# Patient Record
Sex: Female | Born: 1995 | Race: White | Hispanic: No | Marital: Married | Smoking: Former smoker
Health system: Southern US, Community
[De-identification: ages and names within clinical notes are randomized; demographics above are authoritative.]

## PROBLEM LIST (undated history)

## (undated) DIAGNOSIS — F32A Depression, unspecified: Secondary | ICD-10-CM

## (undated) DIAGNOSIS — Z789 Other specified health status: Secondary | ICD-10-CM

## (undated) DIAGNOSIS — I10 Essential (primary) hypertension: Secondary | ICD-10-CM

## (undated) DIAGNOSIS — R87629 Unspecified abnormal cytological findings in specimens from vagina: Secondary | ICD-10-CM

## (undated) DIAGNOSIS — T1490XA Injury, unspecified, initial encounter: Secondary | ICD-10-CM

## (undated) DIAGNOSIS — E119 Type 2 diabetes mellitus without complications: Secondary | ICD-10-CM

## (undated) DIAGNOSIS — E282 Polycystic ovarian syndrome: Secondary | ICD-10-CM

## (undated) HISTORY — DX: Essential (primary) hypertension: I10

## (undated) HISTORY — PX: OTHER SURGICAL HISTORY: SHX169

## (undated) HISTORY — DX: Injury, unspecified, initial encounter: T14.90XA

## (undated) HISTORY — DX: Depression, unspecified: F32.A

## (undated) HISTORY — DX: Type 2 diabetes mellitus without complications: E11.9

## (undated) HISTORY — DX: Unspecified abnormal cytological findings in specimens from vagina: R87.629

## (undated) HISTORY — PX: COLON SURGERY: SHX602

---
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
## 2014-07-22 DIAGNOSIS — O99210 Obesity complicating pregnancy, unspecified trimester: Secondary | ICD-10-CM | POA: Insufficient documentation

## 2014-07-27 DIAGNOSIS — Z23 Encounter for immunization: Secondary | ICD-10-CM | POA: Insufficient documentation

## 2014-09-29 NOTE — Op Note (Signed)
PATIENT NAME:  Latoya Benson, Kinzie L MR#:  [id number] DATE OF BIRTH:  1995-10-09  DATE OF PROCEDURE:  05/23/2012  PREOPERATIVE DIAGNOSIS: Symptomatic cholelithiasis.   POSTOPERATIVE DIAGNOSIS: Symptomatic cholelithiasis.    PROCEDURE PERFORMED: Laparoscopic cholecystectomy without cholangiogram.   SURGEON: Malahki Gasaway A. Jouri Threat, MD   ESTIMATED BLOOD LOSS: 15 mL.   SPECIMENS: Gallbladder with a large gallstone.   COMPLICATIONS: None.   INDICATION FOR SURGERY: Ms. Quintella ReichertHooper is a pleasant 19 year old female with history of right upper quadrant pain. She was found to have gallstones and biliary-type symptoms and, therefore, was brought to the operating room for laparoscopic cholecystectomy.   DETAILS OF PROCEDURE: Ms. Quintella ReichertHooper was brought to the operating room suite. Her consent was signed by her guardian prior. She was laid supine on the operating room table. She was induced, endotracheal tube was placed, and general anesthesia was administered. The abdomen was prepped and draped in standard surgical fashion. An infraumbilical incision was made. It was deepened down to the fascia. The fascia was grasped. Fascia was incised. A finger was placed through the fascial defect. 2-0 Vicryl stay sutures were placed through the fascia. Hassan trocar was placed in the abdomen and the abdomen was insufflated. A camera was placed in the abdomen. There were no adhesions, fortunately. The gallbladder was visualized. An 11 mm trocar was placed into the epigastric region. Two 5 mm ports were placed 2 cm below the costal margin in the midclavicular and right anterior axillary lines. The gallbladder was grasped and lifted over the dome of the liver with a trocar placed through the lateral most port. The infundibulum was grasped and retracted laterally. The cystic duct and cystic artery were dissected out. The critical view was obtained. I then placed clips across the cystic duct and cystic artery and ligated them. The  gallbladder was then taken off the gallbladder fossa and taken out through the umbilical port with an EndoCatch bag. I then irrigated the abdomen and looked for hemostasis. Hemostasis was obtained. I then removed all trocars under visualization. I then tied the previously placed stay sutures to close the umbilical defect. I then injected the skin with 1% lidocaine with epinephrine, approximately 20 mL, and closed the skin with interrupted 3-0 Vicryl deep dermal sutures. Dermabond was then placed over the wound. The patient was then awoken, extubated, and brought to the postanesthesia care unit. There were no immediate complications. Needle, sponge, and instrument counts were correct at the end of the procedure.   ____________________________ Si Raiderhristopher A. Cadee Agro, MD cal:drc D: 05/23/2012 15:08:36 ET T: 05/24/2012 06:14:46 ET JOB#: [id number]  cc: Cristal Deerhristopher A. Eugena Rhue, MD, <Dictator> Jarvis NewcomerHRISTOPHER A Nehemiah Mcfarren MD ELECTRONICALLY SIGNED 05/24/2012 10:40

## 2016-04-02 ENCOUNTER — Emergency Department: Payer: Self-pay

## 2016-04-02 ENCOUNTER — Inpatient Hospital Stay
Admission: EM | Admit: 2016-04-02 | Discharge: 2016-04-05 | DRG: 872 | Disposition: A | Discharge status: Home / Self Care | Payer: Self-pay | Attending: Specialist | Admitting: Specialist

## 2016-04-02 ENCOUNTER — Encounter: Payer: Self-pay | Admitting: Emergency Medicine

## 2016-04-02 DIAGNOSIS — R000 Tachycardia, unspecified: Secondary | ICD-10-CM | POA: Diagnosis present

## 2016-04-02 DIAGNOSIS — R6520 Severe sepsis without septic shock: Secondary | ICD-10-CM

## 2016-04-02 DIAGNOSIS — A419 Sepsis, unspecified organism: Principal | ICD-10-CM | POA: Diagnosis present

## 2016-04-02 DIAGNOSIS — N10 Acute tubulo-interstitial nephritis: Secondary | ICD-10-CM | POA: Diagnosis present

## 2016-04-02 DIAGNOSIS — Z87440 Personal history of urinary (tract) infections: Secondary | ICD-10-CM

## 2016-04-02 DIAGNOSIS — R51 Headache: Secondary | ICD-10-CM | POA: Diagnosis present

## 2016-04-02 DIAGNOSIS — E876 Hypokalemia: Secondary | ICD-10-CM | POA: Diagnosis present

## 2016-04-02 DIAGNOSIS — N390 Urinary tract infection, site not specified: Secondary | ICD-10-CM | POA: Diagnosis present

## 2016-04-02 DIAGNOSIS — Z9049 Acquired absence of other specified parts of digestive tract: Secondary | ICD-10-CM

## 2016-04-02 DIAGNOSIS — Z87891 Personal history of nicotine dependence: Secondary | ICD-10-CM

## 2016-04-02 DIAGNOSIS — R300 Dysuria: Secondary | ICD-10-CM | POA: Diagnosis present

## 2016-04-02 DIAGNOSIS — B9620 Unspecified Escherichia coli [E. coli] as the cause of diseases classified elsewhere: Secondary | ICD-10-CM | POA: Diagnosis present

## 2016-04-02 DIAGNOSIS — N1330 Unspecified hydronephrosis: Secondary | ICD-10-CM | POA: Diagnosis present

## 2016-04-02 HISTORY — DX: Other specified health status: Z78.9

## 2016-04-02 LAB — BLOOD CULTURE ID PANEL (REFLEXED)
ACINETOBACTER BAUMANNII: NOT DETECTED
CANDIDA ALBICANS: NOT DETECTED
CANDIDA GLABRATA: NOT DETECTED
CANDIDA KRUSEI: NOT DETECTED
Candida parapsilosis: NOT DETECTED
Candida tropicalis: NOT DETECTED
Carbapenem resistance: NOT DETECTED
ENTEROBACTERIACEAE SPECIES: DETECTED — AB
ENTEROCOCCUS SPECIES: NOT DETECTED
ESCHERICHIA COLI: DETECTED — AB
Enterobacter cloacae complex: NOT DETECTED
Haemophilus influenzae: NOT DETECTED
KLEBSIELLA OXYTOCA: NOT DETECTED
Klebsiella pneumoniae: NOT DETECTED
LISTERIA MONOCYTOGENES: NOT DETECTED
Neisseria meningitidis: NOT DETECTED
PSEUDOMONAS AERUGINOSA: NOT DETECTED
Proteus species: NOT DETECTED
STREPTOCOCCUS PNEUMONIAE: NOT DETECTED
STREPTOCOCCUS PYOGENES: NOT DETECTED
Serratia marcescens: NOT DETECTED
Staphylococcus aureus (BCID): NOT DETECTED
Staphylococcus species: NOT DETECTED
Streptococcus agalactiae: NOT DETECTED
Streptococcus species: NOT DETECTED

## 2016-04-02 LAB — COMPREHENSIVE METABOLIC PANEL
ALT: 19 U/L (ref 14–54)
ANION GAP: 11 (ref 5–15)
AST: 29 U/L (ref 15–41)
Albumin: 4 g/dL (ref 3.5–5.0)
Alkaline Phosphatase: 51 U/L (ref 38–126)
BUN: 8 mg/dL (ref 6–20)
CHLORIDE: 103 mmol/L (ref 101–111)
CO2: 22 mmol/L (ref 22–32)
Calcium: 9.1 mg/dL (ref 8.9–10.3)
Creatinine, Ser: 1.08 mg/dL — ABNORMAL HIGH (ref 0.44–1.00)
GFR calc non Af Amer: >60 mL/min (ref >60)
Glucose, Bld: 166 mg/dL — ABNORMAL HIGH (ref 65–99)
Potassium: 3.1 mmol/L — ABNORMAL LOW (ref 3.5–5.1)
SODIUM: 136 mmol/L (ref 135–145)
Total Bilirubin: 1 mg/dL (ref 0.3–1.2)
Total Protein: 7.8 g/dL (ref 6.5–8.1)

## 2016-04-02 LAB — PREGNANCY, URINE: Preg Test, Ur: NEGATIVE

## 2016-04-02 LAB — URINALYSIS COMPLETE WITH MICROSCOPIC (ARMC ONLY)
Bilirubin Urine: NEGATIVE
Glucose, UA: NEGATIVE mg/dL
KETONES UR: NEGATIVE mg/dL
NITRITE: NEGATIVE
PROTEIN: 30 mg/dL — AB
Specific Gravity, Urine: 1.006 (ref 1.005–1.030)
pH: 6 (ref 5.0–8.0)

## 2016-04-02 LAB — CBC WITH DIFFERENTIAL/PLATELET
BASOS ABS: 0.1 10*3/uL (ref 0–0.1)
Basophils Relative: 1 %
Eosinophils Absolute: 0 10*3/uL (ref 0–0.7)
Eosinophils Relative: 0 %
HEMATOCRIT: 39.8 % (ref 35.0–47.0)
Hemoglobin: 13.2 g/dL (ref 12.0–16.0)
LYMPHS PCT: 4 %
Lymphs Abs: 0.6 10*3/uL — ABNORMAL LOW (ref 1.0–3.6)
MCH: 30.3 pg (ref 26.0–34.0)
MCHC: 33.3 g/dL (ref 32.0–36.0)
MCV: 91.1 fL (ref 80.0–100.0)
Monocytes Absolute: 0.5 10*3/uL (ref 0.2–0.9)
Monocytes Relative: 4 %
NEUTROS ABS: 12 10*3/uL — AB (ref 1.4–6.5)
NEUTROS PCT: 91 %
PLATELETS: 266 10*3/uL (ref 150–440)
RBC: 4.37 MIL/uL (ref 3.80–5.20)
RDW: 13.5 % (ref 11.5–14.5)
WBC: 13.2 10*3/uL — AB (ref 3.6–11.0)

## 2016-04-02 LAB — LACTIC ACID, PLASMA
LACTIC ACID, VENOUS: 1.6 mmol/L (ref 0.5–1.9)
Lactic Acid, Venous: 3.7 mmol/L (ref 0.5–1.9)

## 2016-04-02 MED ORDER — ONDANSETRON HCL 4 MG/2ML IJ SOLN
INTRAMUSCULAR | Status: AC
  Administered 2016-04-02: 4 mg via INTRAVENOUS
  Filled 2016-04-02: qty 2

## 2016-04-02 MED ORDER — SODIUM CHLORIDE 0.9 % IV BOLUS (SEPSIS)
1000.0000 mL | Freq: Once | INTRAVENOUS | Status: AC
  Administered 2016-04-02: 1000 mL via INTRAVENOUS

## 2016-04-02 MED ORDER — ONDANSETRON HCL 4 MG/2ML IJ SOLN
4.0000 mg | Freq: Four times a day (QID) | INTRAMUSCULAR | Status: DC | PRN
  Administered 2016-04-02: 4 mg via INTRAVENOUS
  Filled 2016-04-02: qty 2

## 2016-04-02 MED ORDER — IBUPROFEN 400 MG PO TABS
800.0000 mg | ORAL_TABLET | Freq: Once | ORAL | Status: AC
  Administered 2016-04-02: 800 mg via ORAL
  Filled 2016-04-02: qty 2

## 2016-04-02 MED ORDER — INFLUENZA VAC SPLIT QUAD 0.5 ML IM SUSY
0.5000 mL | PREFILLED_SYRINGE | INTRAMUSCULAR | Status: AC
  Administered 2016-04-05: 0.5 mL via INTRAMUSCULAR
  Filled 2016-04-02: qty 0.5

## 2016-04-02 MED ORDER — ONDANSETRON HCL 4 MG PO TABS
4.0000 mg | ORAL_TABLET | Freq: Four times a day (QID) | ORAL | Status: DC | PRN
  Administered 2016-04-02 – 2016-04-04 (×4): 4 mg via ORAL
  Filled 2016-04-02 (×4): qty 1

## 2016-04-02 MED ORDER — POTASSIUM CHLORIDE CRYS ER 20 MEQ PO TBCR
40.0000 meq | EXTENDED_RELEASE_TABLET | Freq: Once | ORAL | Status: AC
  Administered 2016-04-02: 40 meq via ORAL
  Filled 2016-04-02: qty 2

## 2016-04-02 MED ORDER — MORPHINE SULFATE (PF) 2 MG/ML IV SOLN: 2.0000 mg | INTRAVENOUS | Status: DC | PRN

## 2016-04-02 MED ORDER — ACETAMINOPHEN 325 MG PO TABS
650.0000 mg | ORAL_TABLET | Freq: Once | ORAL | Status: AC | PRN
  Administered 2016-04-02: 650 mg via ORAL

## 2016-04-02 MED ORDER — ALUM & MAG HYDROXIDE-SIMETH 200-200-20 MG/5ML PO SUSP
30.0000 mL | ORAL | Status: DC | PRN
  Administered 2016-04-04: 30 mL via ORAL
  Filled 2016-04-02: qty 30

## 2016-04-02 MED ORDER — DEXTROSE 5 % IV SOLN: 2.0000 g | Freq: Once | INTRAVENOUS | Status: DC

## 2016-04-02 MED ORDER — CEFTRIAXONE SODIUM-DEXTROSE 2-2.22 GM-% IV SOLR
2.0000 g | INTRAVENOUS | Status: DC
  Filled 2016-04-02: qty 50

## 2016-04-02 MED ORDER — MORPHINE SULFATE (PF) 2 MG/ML IV SOLN
2.0000 mg | Freq: Once | INTRAVENOUS | Status: AC
  Administered 2016-04-02: 2 mg via INTRAVENOUS
  Filled 2016-04-02: qty 1

## 2016-04-02 MED ORDER — MORPHINE SULFATE (PF) 2 MG/ML IV SOLN
INTRAVENOUS | Status: AC
  Administered 2016-04-02: 2 mg via INTRAVENOUS
  Filled 2016-04-02: qty 1

## 2016-04-02 MED ORDER — ONDANSETRON HCL 4 MG/2ML IJ SOLN
4.0000 mg | Freq: Once | INTRAMUSCULAR | Status: AC
  Administered 2016-04-02: 4 mg via INTRAVENOUS

## 2016-04-02 MED ORDER — ACETAMINOPHEN 650 MG RE SUPP: 650.0000 mg | Freq: Four times a day (QID) | RECTAL | Status: DC | PRN

## 2016-04-02 MED ORDER — OXYCODONE HCL 5 MG PO TABS
5.0000 mg | ORAL_TABLET | ORAL | Status: DC | PRN
  Administered 2016-04-02 – 2016-04-04 (×3): 5 mg via ORAL
  Filled 2016-04-02 (×3): qty 1

## 2016-04-02 MED ORDER — SODIUM CHLORIDE 0.9 % IV SOLN
INTRAVENOUS | Status: DC
  Administered 2016-04-02 – 2016-04-04 (×5): via INTRAVENOUS

## 2016-04-02 MED ORDER — MORPHINE SULFATE (PF) 2 MG/ML IV SOLN
2.0000 mg | Freq: Once | INTRAVENOUS | Status: AC
  Administered 2016-04-02: 2 mg via INTRAVENOUS

## 2016-04-02 MED ORDER — ENOXAPARIN SODIUM 40 MG/0.4ML ~~LOC~~ SOLN
SUBCUTANEOUS | Status: AC
  Administered 2016-04-02: 40 mg via SUBCUTANEOUS
  Filled 2016-04-02: qty 0.4

## 2016-04-02 MED ORDER — ACETAMINOPHEN 325 MG PO TABS
ORAL_TABLET | ORAL | Status: AC
  Administered 2016-04-02: 650 mg via ORAL
  Filled 2016-04-02: qty 2

## 2016-04-02 MED ORDER — CEFTRIAXONE SODIUM-DEXTROSE 2-2.22 GM-% IV SOLR
2.0000 g | Freq: Once | INTRAVENOUS | Status: AC
  Administered 2016-04-02: 2 g via INTRAVENOUS
  Filled 2016-04-02: qty 50

## 2016-04-02 MED ORDER — ACETAMINOPHEN 325 MG PO TABS
650.0000 mg | ORAL_TABLET | Freq: Four times a day (QID) | ORAL | Status: DC | PRN
  Administered 2016-04-02 – 2016-04-03 (×2): 650 mg via ORAL
  Filled 2016-04-02 (×3): qty 2

## 2016-04-02 MED ORDER — ENOXAPARIN SODIUM 40 MG/0.4ML ~~LOC~~ SOLN
40.0000 mg | SUBCUTANEOUS | Status: DC
  Administered 2016-04-03 – 2016-04-04 (×2): 40 mg via SUBCUTANEOUS
  Filled 2016-04-02 (×2): qty 0.4

## 2016-04-02 NOTE — ED Provider Notes (Signed)
[facility]  I accepted care from Dr. Zenda AlpersWebster ____________________________________________    LABS (pertinent positives/negatives)  Labs Reviewed  COMPREHENSIVE METABOLIC PANEL - Abnormal; Notable for the following:       Result Value   Potassium 3.1 (*)    Glucose, Bld 166 (*)    Creatinine, Ser 1.08 (*)    All other components within normal limits  LACTIC ACID, PLASMA - Abnormal; Notable for the following:    Lactic Acid, Venous 3.7 (*)    All other components within normal limits  CBC WITH DIFFERENTIAL/PLATELET - Abnormal; Notable for the following:    WBC 13.2 (*)    Neutro Abs 12.0 (*)    Lymphs Abs 0.6 (*)    All other components within normal limits  URINALYSIS COMPLETEWITH MICROSCOPIC ([facility] ONLY) - Abnormal; Notable for the following:    Color, Urine YELLOW (*)    APPearance CLOUDY (*)    Hgb urine dipstick 2+ (*)    Protein, ur 30 (*)    Leukocytes, UA 3+ (*)    Bacteria, UA MANY (*)    Squamous Epithelial / LPF 0-5 (*)    All other components within normal limits  CULTURE, BLOOD (ROUTINE X 2)  CULTURE, BLOOD (ROUTINE X 2)  URINE CULTURE  LACTIC ACID, PLASMA  PREGNANCY, URINE     ____________________________________________    RADIOLOGY All xrays were viewed by me. Imaging interpreted by radiologist.  CT abdomen and pelvis stone study:  IMPRESSION: Right perinephric and periureteral stranding edema with mild right pelviectasis and diffuse right hydroureter. No associated radiopaque obstructing urinary tract calculus. Appearance is compatible with pyelonephritis versus recently passed stone.  Remote cholecystectomy.  Unilateral right L5 pars defect.  ____________________________________________   PROCEDURES  Procedure(s) performed: None  Critical Care performed: None  ____________________________________________   INITIAL IMPRESSION / ASSESSMENT AND PLAN / ED COURSE   Pertinent labs & imaging results that were  available during my care of the patient were reviewed by me and considered in my medical decision making (see chart for details).  Patient is being treated for sepsis with elevated lactate, urinary tract infection, and elevated white blood cell count with antibiotics and fluid resuscitation. No hypotension.  I did give her pain control with morphine.  CT scan result I discussed with the radiologist, no kidney stone although there is a plebolith visible.  CT scan does not show a kidney stone, symptoms consistent with right-sided pyelonephritis.  CONSULTATIONS: Hospitalist for admission.    Patient / Family / Caregiver informed of clinical course, medical decision-making process, and agree with plan.     ____________________________________________   FINAL CLINICAL IMPRESSION(S) / ED DIAGNOSES  Final diagnoses:  Severe sepsis (HCC)  Acute pyelonephritis        Governor Rooksebecca Danil Wedge, MD 04/02/16 [phone]

## 2016-04-02 NOTE — Progress Notes (Signed)
Md paged , pt c/o heartburn.

## 2016-04-02 NOTE — ED Notes (Signed)
MD at bedside. 

## 2016-04-02 NOTE — ED Notes (Signed)
Patient transported to X-ray 

## 2016-04-02 NOTE — H&P (Signed)
[facility] [place] [facility]   PATIENT NAME: Latoya Benson    MR#:  [id number]  DATE OF BIRTH:  1995-11-16   DATE OF ADMISSION:  04/02/2016  PRIMARY CARE PHYSICIAN: No PCP Per Patient   REQUESTING/REFERRING PHYSICIAN: Lord  CHIEF COMPLAINT:   Chief Complaint  Patient presents with  . Urinary Frequency  . Back Pain  . Code Sepsis    HISTORY OF PRESENT ILLNESS:  Latoya Benson  is a 20 y.o. female with a known history of Cholecystectomy otherwise no pertinent medical history who is presenting with 3 day duration of back pain. She describes right-sided flank pain 3 day duration described as sharp in quality progressively worsening intensity this morning 10/10 associated right gutters subjective fever and chills, urinary frequency and dysuria.  On arrival to emergency department meeting septic criteria code sepsis initiated  PAST MEDICAL HISTORY:   Past Medical History:  Diagnosis Date  . Medical history non-contributory     PAST SURGICAL HISTORY:   Past Surgical History:  Procedure Laterality Date  . CHOLECYSTECTOMY    . COLON SURGERY      SOCIAL HISTORY:   Social History  Substance Use Topics  . Smoking status: Former Games developer  . Smokeless tobacco: Never Used  . Alcohol use Not on file    FAMILY HISTORY:   Family History  Problem Relation Age of Onset  . Diabetes Neg Hx     DRUG ALLERGIES:  No Known Allergies  REVIEW OF SYSTEMS:  REVIEW OF SYSTEMS:  CONSTITUTIONAL: Positive fevers, chills, fatigue, weakness.  EYES: Denies blurred vision, double vision, or eye pain.  EARS, NOSE, THROAT: Denies tinnitus, ear pain, hearing loss.  RESPIRATORY: denies cough, shortness of breath, wheezing  CARDIOVASCULAR: Denies chest pain, palpitations, edema.  GASTROINTESTINAL: Positive nausea, vomiting, denies diarrhea, abdominal pain.  GENITOURINARY: Positive dysuria, denies hematuria.  ENDOCRINE: Denies nocturia or thyroid  problems. HEMATOLOGIC AND LYMPHATIC: Denies easy bruising or bleeding.  SKIN: Denies rash or lesions.  MUSCULOSKELETAL: Denies pain in neck, back, shoulder, knees, hips, or further arthritic symptoms.  NEUROLOGIC: Denies paralysis, paresthesias.  PSYCHIATRIC: Denies anxiety or depressive symptoms. Otherwise full review of systems performed by me is negative.   MEDICATIONS AT HOME:   Prior to Admission medications   Medication Sig Start Date End Date Taking? Authorizing Provider  acetaminophen (TYLENOL) 500 MG tablet Take 2,000 mg by mouth every 6 (six) hours as needed.   Yes Historical Provider, MD      VITAL SIGNS:  Blood pressure 106/74, pulse (!) 125, temperature 100 F (37.8 C), temperature source Oral, resp. rate 16, height 5\' 2"  (1.575 m), weight 83 kg (183 lb), last menstrual period 03/12/2016, SpO2 98 %.  PHYSICAL EXAMINATION:  VITAL SIGNS: Vitals:   04/02/16 0941 04/02/16 1036  BP: 128/78 106/74  Pulse: 88 (!) 125  Resp: 18 16  Temp:     GENERAL:20 y.o.female currently in no acute distress.  HEAD: Normocephalic, atraumatic.  EYES: Pupils equal, round, reactive to light. Extraocular muscles intact. No scleral icterus.  MOUTH: Moist mucosal membrane. Dentition intact. No abscess noted.  EAR, NOSE, THROAT: Clear without exudates. No external lesions.  NECK: Supple. No thyromegaly. No nodules. No JVD.  PULMONARY: Clear to ascultation, without wheeze rails or rhonci. No use of accessory muscles, Good respiratory effort. good air entry bilaterally CHEST: Nontender to palpation.  CARDIOVASCULAR: S1 and S2.Tachycardic No murmurs, rubs, or gallops. No edema. Pedal pulses 2+ bilaterally.  GASTROINTESTINAL: Positive right-sided CVA tenderness Soft,  nontender, nondistended. No masses. Positive bowel sounds. No hepatosplenomegaly.  MUSCULOSKELETAL: No swelling, clubbing, or edema. Range of motion full in all extremities.  NEUROLOGIC: Cranial nerves II through XII are intact. No  gross focal neurological deficits. Sensation intact. Reflexes intact.  SKIN: No ulceration, lesions, rashes, or cyanosis. Skin warm and dry. Turgor intact.  PSYCHIATRIC: Mood, affect within normal limits. The patient is awake, alert and oriented x 3. Insight, judgment intact.    LABORATORY PANEL:   CBC  Recent Labs Lab 04/02/16 0603  WBC 13.2*  HGB 13.2  HCT 39.8  PLT 266   ------------------------------------------------------------------------------------------------------------------  Chemistries   Recent Labs Lab 04/02/16 0603  NA 136  K 3.1*  CL 103  CO2 22  GLUCOSE 166*  BUN 8  CREATININE 1.08*  CALCIUM 9.1  AST 29  ALT 19  ALKPHOS 51  BILITOT 1.0   ------------------------------------------------------------------------------------------------------------------  Cardiac Enzymes No results for input(s): TROPONINI in the last 168 hours. ------------------------------------------------------------------------------------------------------------------  RADIOLOGY:  Dg Chest 2 View  Result Date: 04/02/2016 CLINICAL DATA:  20 year old female with fever EXAM: CHEST  2 VIEW COMPARISON:  Chest radiograph dated 07/27/2011 FINDINGS: The heart size and mediastinal contours are within normal limits. Both lungs are clear. The visualized skeletal structures are unremarkable. IMPRESSION: No active cardiopulmonary disease. Electronically Signed   By: Elgie CollardArash  Radparvar M.D.   On: 04/02/2016 06:46   Ct Renal Stone Study  Result Date: 04/02/2016 CLINICAL DATA:  Acute right flank pain, fever, sepsis EXAM: CT ABDOMEN AND PELVIS WITHOUT CONTRAST TECHNIQUE: Multidetector CT imaging of the abdomen and pelvis was performed following the standard protocol without IV contrast. COMPARISON:  05/16/2012 ultrasound FINDINGS: Lower chest: No acute abnormality. Hepatobiliary: No focal liver abnormality is seen. Status post cholecystectomy. No biliary dilatation. Pancreas: Unremarkable. No  pancreatic ductal dilatation or surrounding inflammatory changes. Spleen: Normal in size without focal abnormality. Accessory splenule noted. Adrenals/Urinary Tract: Normal adrenal glands. Right kidney demonstrates perinephric strandy edema and mild pelviectasis. There is associated diffuse mild right hydroureter with periureteral stranding edema but no radiopaque obstructing urinary tract calculus. Appearance is compatible with ascending urinary tract infection (pyelonephritis) versus recently passed stone. Left kidney and ureter demonstrate no acute process or nephrolithiasis. Stomach/Bowel: Stomach is within normal limits. Appendix appears normal. No evidence of bowel wall thickening, distention, or inflammatory changes. Vascular/Lymphatic: No significant vascular findings are present. No enlarged abdominal or pelvic lymph nodes. Reproductive: Uterus and bilateral adnexa are unremarkable. Other: No abdominal wall hernia or abnormality. No abdominopelvic ascites. Musculoskeletal: No acute osseous finding. L5 demonstrates a unilateral right pars defects without associated anterior slippage. IMPRESSION: Right perinephric and periureteral stranding edema with mild right pelviectasis and diffuse right hydroureter. No associated radiopaque obstructing urinary tract calculus. Appearance is compatible with pyelonephritis versus recently passed stone. Remote cholecystectomy. Unilateral right L5 pars defect. Electronically Signed   By: Judie PetitM.  Shick M.D.   On: 04/02/2016 09:05    EKG:  No orders found for this or any previous visit.  IMPRESSION AND PLAN:   20 year old Caucasian female without significant medical history presenting with flank pain  1.Sepsis, meeting septic criteria by temperature, heart rate, leukocytosis present on arrival. Source pyelonephritis Code sepsis initiated. Panculture. Broad-spectrum antibiotics including ceftriaxone and taper antibiotics when culture data returns.  Has received a 30 mL/kg  IV fluid bolus. Continue IV fluid hydration to keep mean arterial pressure greater than 65. may require pressor therapy if blood pressure worsens. We will repeat lactic acid if the initial is greater than 2.2.  2. Hypokalemia: Replace goal 4-5     All the records are reviewed and case discussed with ED provider. Management plans discussed with the patient, family and they are in agreement.  CODE STATUS: Full  TOTAL TIME TAKING CARE OF THIS PATIENT: 33 minutes.    Jenia Klepper,  Mardi Mainland.D on 04/02/2016 at 10:59 AM  Between 7am to 6pm - Pager - [phone]  After 6pm: House Pager: - [phone]  [facility]  Office  [phone]  CC: Primary care physician; No PCP Per Patient

## 2016-04-02 NOTE — ED Notes (Signed)
Back from ct scan    C/o headache and flank pain   Dr Shaune PollackLord aware  IV meds given for pain

## 2016-04-02 NOTE — Progress Notes (Signed)
Pharmacy Antibiotic Note  Latoya Benson is a 20 y.o. female admitted on 04/02/2016 with UTI.  Pharmacy has been consulted for ceftriaxone dosing.  Plan: Ceftriaxone 2 grams q 24 hours ordered.  Height: 5\' 2"  (157.5 cm) Weight: 183 lb (83 kg) IBW/kg (Calculated) : 50.1  Temp (24hrs), Avg:102.5 F (39.2 C), Min:102.5 F (39.2 C), Max:102.5 F (39.2 C)   Recent Labs Lab 04/02/16 0603  WBC 13.2*  CREATININE 1.08*  LATICACIDVEN 3.7*    Estimated Creatinine Clearance: 82.3 mL/min (by C-G formula based on SCr of 1.08 mg/dL (H)).    No Known Allergies  Antimicrobials this admission: ceftriaxone  >>    >>   Dose adjustments this admission:   Microbiology results: 10/22 BCx: pending 10/22 UCx: pending     10/22 UA: pending   Thank you for allowing pharmacy to be a part of this patient's care.  Cristyn Crossno S 04/02/2016 6:57 AM

## 2016-04-02 NOTE — ED Triage Notes (Signed)
Patient with right lower back pain times three days. Patient states that she is having lower abdominal pain and urinary frequency.

## 2016-04-02 NOTE — Progress Notes (Signed)
Dr. Clint GuyHower notified of temp 101.8. MD to enter orders.

## 2016-04-02 NOTE — ED Notes (Signed)
To ct scan via w/c  Feeling better

## 2016-04-02 NOTE — ED Provider Notes (Signed)
[facility] Emergency Department Provider Note   ____________________________________________   First MD Initiated Contact with Patient 04/02/16 [phone]     (approximate)  I have reviewed the triage vital signs and the nursing notes.   HISTORY  Chief Complaint Urinary Frequency; Back Pain; and Code Sepsis    HPI Latoya Benson is a 20 y.o. female comes into the hospital today with low back pain. The patient reports that this started 2 days ago. She thought she had eaten some greasy food and because she doesn't have a gallbladder she thought that might have been the cause of her pain. The patient took some Tylenol but reports that the pain continued and was worse while she was at work last night. The patient has had some chills with some headache. The patient reports that by the time she left work last night she was dripping sweat. At 4:30 this morning she started vomiting so she decided to come in to the hospital for evaluation. The patient denies burning with urination but reports that she's been having to go frequently. She also reports that after she uses the bathroom she feels as though he has to go again. The patient denies fever at home. The pain was initially on the right side of her back and now it is bilateral. The patient denies any cough or runny nose. She denies any chest pain. She is here for evaluation.The patient rates her pain a 10 out of 10 in intensity.   History reviewed. No pertinent past medical history.  There are no active problems to display for this patient.   Past Surgical History:  Procedure Laterality Date  . CHOLECYSTECTOMY    . COLON SURGERY      Prior to Admission medications   Medication Sig Start Date End Date Taking? Authorizing Provider  acetaminophen (TYLENOL) 500 MG tablet Take 2,000 mg by mouth every 6 (six) hours as needed.   Yes Historical Provider, MD    Allergies Review of patient's allergies indicates no  known allergies.  No family history on file.  Social History Social History  Substance Use Topics  . Smoking status: Former Games developer  . Smokeless tobacco: Never Used  . Alcohol use Not on file    Review of Systems Constitutional:  fever/chills Eyes: No visual changes. ENT: No sore throat. Cardiovascular: Denies chest pain. Respiratory: Denies shortness of breath. Gastrointestinal:  abdominal pain, nausea, vomiting.  No diarrhea.  No constipation. Genitourinary: Frequency and urgency Musculoskeletal:  back pain. Skin: Negative for rash. Neurological: Negative for headaches, focal weakness or numbness.  10-point ROS otherwise negative.  ____________________________________________   PHYSICAL EXAM:  VITAL SIGNS: ED Triage Vitals [04/02/16 0545]  Enc Vitals Group     BP (!) 107/92     Pulse Rate (!) 151     Resp 18     Temp (!) 102.5 F (39.2 C)     Temp Source Oral     SpO2 97 %     Weight 183 lb (83 kg)     Height 5\' 2"  (1.575 m)     Head Circumference      Peak Flow      Pain Score 10     Pain Loc      Pain Edu?      Excl. in GC?     Constitutional: Alert and oriented. Well appearing and in mild distress. Eyes: Conjunctivae are normal. PERRL. EOMI. Head: Atraumatic. Nose: No congestion/rhinnorhea. Mouth/Throat: Mucous membranes are moist.  Oropharynx non-erythematous. Cardiovascular: Normal rate, regular rhythm. Grossly normal heart sounds.  Good peripheral circulation. Respiratory: Normal respiratory effort.  No retractions. Lungs CTAB. Gastrointestinal: Soft With some mild lower abdominal tenderness to palpation. No distention. Positive bowel sounds Musculoskeletal: No lower extremity tenderness nor edema.  Neurologic:  Normal speech and language.  Skin:  Skin is warm, dry and intact.  Psychiatric: Mood and affect are normal.   ____________________________________________   LABS (all labs ordered are listed, but only abnormal results are  displayed)  Labs Reviewed  COMPREHENSIVE METABOLIC PANEL - Abnormal; Notable for the following:       Result Value   Potassium 3.1 (*)    Glucose, Bld 166 (*)    Creatinine, Ser 1.08 (*)    All other components within normal limits  LACTIC ACID, PLASMA - Abnormal; Notable for the following:    Lactic Acid, Venous 3.7 (*)    All other components within normal limits  CBC WITH DIFFERENTIAL/PLATELET - Abnormal; Notable for the following:    WBC 13.2 (*)    Neutro Abs 12.0 (*)    Lymphs Abs 0.6 (*)    All other components within normal limits  URINALYSIS COMPLETEWITH MICROSCOPIC ([facility] ONLY) - Abnormal; Notable for the following:    Color, Urine YELLOW (*)    APPearance CLOUDY (*)    Hgb urine dipstick 2+ (*)    Protein, ur 30 (*)    Leukocytes, UA 3+ (*)    Bacteria, UA MANY (*)    Squamous Epithelial / LPF 0-5 (*)    All other components within normal limits  CULTURE, BLOOD (ROUTINE X 2)  CULTURE, BLOOD (ROUTINE X 2)  URINE CULTURE  PREGNANCY, URINE  LACTIC ACID, PLASMA   ____________________________________________  EKG  ED ECG REPORT I, Rebecka Apley, the attending physician, personally viewed and interpreted this ECG.   Date: 04/02/2016  EKG Time: 559  Rate: 137  Rhythm: sinus tachycardia  Axis: normal  Intervals:none  ST&T Change: none  ____________________________________________  RADIOLOGY  CT renal ____________________________________________   PROCEDURES  Procedure(s) performed: None  Procedures  Critical Care performed: Yes, see critical care note(s)   CRITICAL CARE Performed by: Lucrezia Europe P   Total critical care time: 40 minutes  Critical care time was exclusive of separately billable procedures and treating other patients.  Critical care was necessary to treat or prevent imminent or life-threatening deterioration.  Critical care was time spent personally by me on the following activities: development of treatment plan  with patient and/or surrogate as well as nursing, discussions with consultants, evaluation of patient's response to treatment, examination of patient, obtaining history from patient or surrogate, ordering and performing treatments and interventions, ordering and review of laboratory studies, ordering and review of radiographic studies, pulse oximetry and re-evaluation of patient's condition.   ____________________________________________   INITIAL IMPRESSION / ASSESSMENT AND PLAN / ED COURSE  Pertinent labs & imaging results that were available during my care of the patient were reviewed by me and considered in my medical decision making (see chart for details).  This is a 20 year old female who comes into the hospital today with some back pain and fever and tachycardia. The patient was vomiting which is what brought her into the hospital. A code sepsis was called when the patient came in because of her tachycardia as well as her fever. We did give the patient liter of normal saline initially and awaited the results of her blood work. The patient's lactic acid  did return at 3.7 so we did increase the patient's fluid to reflect 30 mls/kg. He also appears that the patient has a urinary tract infection. Given the patient's back pain I did send her for a CT scan to ensure that she does not have a kidney stone. Patient also received a dose of ceftriaxone for her infection.  Clinical Course  Value Comment By Time  DG Chest 2 View No active cardiopulmonary disease. Rebecka ApleyAllison P Webster, MD 10/22 [phone]   ED Sepsis - Repeat Assessment   Performed at:    04/02/16  Last Vitals:    Blood pressure 130/75, pulse 100, temperature 100 F (37.8 C), temperature source Oral, resp. rate 20, height 5\' 2"  (1.575 m), weight 183 lb (83 kg), last menstrual period 03/12/2016, SpO2 98 %.  Heart:                 Tachycardia, no m/r/g  Lungs:     CTAB  Capillary Refill:   < 2sec  Peripheral Pulse (include location): 2+  dorsalis pedis   Skin (include color):   Warm, dry, normal color   ____________________________________________   FINAL CLINICAL IMPRESSION(S) / ED DIAGNOSES  Final diagnoses:  Severe sepsis (HCC)  Acute pyelonephritis  Kidney stone      NEW MEDICATIONS STARTED DURING THIS VISIT:  New Prescriptions   No medications on file     Note:  This document was prepared using Dragon voice recognition software and may include unintentional dictation errors.    Rebecka ApleyAllison P Webster, MD 04/02/16 [phone]

## 2016-04-03 DIAGNOSIS — N10 Acute tubulo-interstitial nephritis: Secondary | ICD-10-CM

## 2016-04-03 DIAGNOSIS — R6520 Severe sepsis without septic shock: Secondary | ICD-10-CM

## 2016-04-03 LAB — BASIC METABOLIC PANEL
Anion gap: 7 (ref 5–15)
BUN: 6 mg/dL (ref 6–20)
CALCIUM: 7.9 mg/dL — AB (ref 8.9–10.3)
CHLORIDE: 104 mmol/L (ref 101–111)
CO2: 23 mmol/L (ref 22–32)
CREATININE: 0.81 mg/dL (ref 0.44–1.00)
GFR calc Af Amer: >60 mL/min (ref >60)
GFR calc non Af Amer: >60 mL/min (ref >60)
GLUCOSE: 100 mg/dL — AB (ref 65–99)
Potassium: 3.3 mmol/L — ABNORMAL LOW (ref 3.5–5.1)
Sodium: 134 mmol/L — ABNORMAL LOW (ref 135–145)

## 2016-04-03 LAB — CBC
HCT: 34.7 % — ABNORMAL LOW (ref 35.0–47.0)
Hemoglobin: 11.8 g/dL — ABNORMAL LOW (ref 12.0–16.0)
MCH: 30.9 pg (ref 26.0–34.0)
MCHC: 33.9 g/dL (ref 32.0–36.0)
MCV: 91.2 fL (ref 80.0–100.0)
PLATELETS: 197 10*3/uL (ref 150–440)
RBC: 3.81 MIL/uL (ref 3.80–5.20)
RDW: 13.9 % (ref 11.5–14.5)
WBC: 8.7 10*3/uL (ref 3.6–11.0)

## 2016-04-03 MED ORDER — SODIUM CHLORIDE 0.9 % IV SOLN
1.0000 g | Freq: Three times a day (TID) | INTRAVENOUS | Status: DC
  Administered 2016-04-03 – 2016-04-04 (×5): 1 g via INTRAVENOUS
  Filled 2016-04-03 (×8): qty 1

## 2016-04-03 MED ORDER — POTASSIUM CHLORIDE CRYS ER 20 MEQ PO TBCR
40.0000 meq | EXTENDED_RELEASE_TABLET | Freq: Once | ORAL | Status: AC
  Administered 2016-04-03: 40 meq via ORAL
  Filled 2016-04-03: qty 2

## 2016-04-03 MED ORDER — BUTALBITAL-APAP-CAFFEINE 50-325-40 MG PO TABS
1.0000 | ORAL_TABLET | ORAL | Status: DC | PRN
  Administered 2016-04-03 – 2016-04-04 (×5): 1 via ORAL
  Filled 2016-04-03 (×6): qty 1

## 2016-04-03 NOTE — Progress Notes (Signed)
[facility] - [place] at [facility]                                                                                                                                                                                            Patient Demographics   Latoya Benson, is a 20 y.o. female, DOB - 1995/11/11, ZOX:[id number]  Admit date - 04/02/2016   Admitting Physician Wyatt Haste, MD  Outpatient Primary MD for the patient is No PCP Per Patient   LOS - 1  Subjective: Patient continues to complain of having flank pain and a headache. He denies any chest pain or palpitations denies any nausea or vomiting    Review of Systems:   CONSTITUTIONAL: No documented fever. No fatigue, weakness. No weight gain, no weight loss.  EYES: No blurry or double vision.  ENT: No tinnitus. No postnasal drip. No redness of the oropharynx.  RESPIRATORY: No cough, no wheeze, no hemoptysis. No dyspnea.  CARDIOVASCULAR: No chest pain. No orthopnea. No palpitations. No syncope.  GASTROINTESTINAL: No nausea, no vomiting or diarrhea. No abdominal pain. No melena or hematochezia.  GENITOURINARY: Positive dysuria Positive hematuria. Positive flank pain ENDOCRINE: No polyuria or nocturia. No heat or cold intolerance.  HEMATOLOGY: No anemia. No bruising. No bleeding.  INTEGUMENTARY: No rashes. No lesions.  MUSCULOSKELETAL: No arthritis. No swelling. No gout.  NEUROLOGIC: No numbness, tingling, or ataxia. No seizure-type activity.  PSYCHIATRIC: No anxiety. No insomnia. No ADD.    Vitals:   Vitals:   04/02/16 2142 04/03/16 0450 04/03/16 0953 04/03/16 1214  BP: 112/73 124/66  (!) 122/58  Pulse: (!) 106 (!) 117  (!) 111  Resp: (!) 24 (!) 22  18  Temp: 98.9 F (37.2 C) (!) 100.5 F (38.1 C) (!) 100.5 F (38.1 C) 99.9 F (37.7 C)  TempSrc: Oral Oral Oral Oral  SpO2: 100% 97%  100%  Weight:      Height:        Wt Readings from Last 3 Encounters:  04/02/16 183 lb (83 kg)      Intake/Output Summary (Last 24 hours) at 04/03/16 1338 Last data filed at 04/03/16 0458  Gross per 24 hour  Intake             2294 ml  Output                0 ml  Net             2294 ml    Physical Exam:   GENERAL: Pleasant-appearing in no apparent distress.  HEAD, EYES, EARS, NOSE AND THROAT: Atraumatic, normocephalic. Extraocular muscles  are intact. Pupils equal and reactive to light. Sclerae anicteric. No conjunctival injection. No oro-pharyngeal erythema.  NECK: Supple. There is no jugular venous distention. No bruits, no lymphadenopathy, no thyromegaly.  HEART: Regular rate and rhythm,. No murmurs, no rubs, no clicks.  LUNGS: Clear to auscultation bilaterally. No rales or rhonchi. No wheezes.  ABDOMEN: Soft, flat, nontender, nondistended. Has good bowel sounds. No hepatosplenomegaly appreciated. Positive flank tenderness EXTREMITIES: No evidence of any cyanosis, clubbing, or peripheral edema.  +2 pedal and radial pulses bilaterally.  NEUROLOGIC: The patient is alert, awake, and oriented x3 with no focal motor or sensory deficits appreciated bilaterally.  SKIN: Moist and warm with no rashes appreciated.  Psych: Not anxious, depressed LN: No inguinal LN enlargement    Antibiotics   Anti-infectives    Start     Dose/Rate Route Frequency Ordered Stop   04/03/16 1000  cefTRIAXone (ROCEPHIN) IVPB 2 g  Status:  Discontinued     2 g 100 mL/hr over 30 Minutes Intravenous Every 24 hours 04/02/16 0656 04/03/16 0043   04/03/16 0200  meropenem (MERREM) 1 g in sodium chloride 0.9 % 100 mL IVPB     1 g 200 mL/hr over 30 Minutes Intravenous Every 8 hours 04/03/16 0043     04/02/16 0700  cefTRIAXone (ROCEPHIN) 2 g in dextrose 5 % 50 mL IVPB  Status:  Discontinued     2 g 100 mL/hr over 30 Minutes Intravenous  Once 04/02/16 0648 04/02/16 0651   04/02/16 0700  cefTRIAXone (ROCEPHIN) IVPB 2 g     2 g 100 mL/hr over 30 Minutes Intravenous  Once 04/02/16 0651 04/02/16 0737       Medications   Scheduled Meds: . enoxaparin (LOVENOX) injection  40 mg Subcutaneous Q24H  . Influenza vac split quadrivalent PF  0.5 mL Intramuscular Tomorrow-1000  . meropenem (MERREM) IV  1 g Intravenous Q8H   Continuous Infusions: . sodium chloride 100 mL/hr at 04/03/16 0955   PRN Meds:.acetaminophen **OR** acetaminophen, alum & mag hydroxide-simeth, butalbital-acetaminophen-caffeine, morphine injection, ondansetron **OR** ondansetron (ZOFRAN) IV, oxyCODONE   Data Review:   Micro Results Recent Results (from the past 240 hour(s))  Culture, blood (Routine x 2)     Status: None (Preliminary result)   Collection Time: 04/02/16  6:03 AM  Result Value Ref Range Status   Specimen Description BLOOD LEFT FOREARM  Final   Special Requests BOTTLES DRAWN AEROBIC AND ANAEROBIC 6CCAERO,5CCANA  Final   Culture  Setup Time   Final    Organism ID to follow GRAM NEGATIVE RODS AEROBIC BOTTLE ONLY CRITICAL RESULT CALLED TO, READ BACK BY AND VERIFIED WITH: MATT MCBANE AT 2320 ON 04/02/16 RWW CONFIRMED BY RWW    Culture NO GROWTH 1 DAY  Final   Report Status PENDING  Incomplete  Blood Culture ID Panel (Reflexed)     Status: Abnormal   Collection Time: 04/02/16  6:03 AM  Result Value Ref Range Status   Enterococcus species NOT DETECTED NOT DETECTED Final   Listeria monocytogenes NOT DETECTED NOT DETECTED Final   Staphylococcus species NOT DETECTED NOT DETECTED Final   Staphylococcus aureus NOT DETECTED NOT DETECTED Final   Streptococcus species NOT DETECTED NOT DETECTED Final   Streptococcus agalactiae NOT DETECTED NOT DETECTED Final   Streptococcus pneumoniae NOT DETECTED NOT DETECTED Final   Streptococcus pyogenes NOT DETECTED NOT DETECTED Final   Acinetobacter baumannii NOT DETECTED NOT DETECTED Final   Enterobacteriaceae species DETECTED (A) NOT DETECTED Final    Comment: CRITICAL RESULT CALLED  TO, READ BACK BY AND VERIFIED WITH: MATT MCBANE AT 2320 ON 04/02/16 RWW     Enterobacter cloacae complex NOT DETECTED NOT DETECTED Final   Escherichia coli DETECTED (A) NOT DETECTED Final    Comment: CRITICAL RESULT CALLED TO, READ BACK BY AND VERIFIED WITH: MATT MCBANE AT 2320 ON 04/02/16 RWW    Klebsiella oxytoca NOT DETECTED NOT DETECTED Final   Klebsiella pneumoniae NOT DETECTED NOT DETECTED Final   Proteus species NOT DETECTED NOT DETECTED Final   Serratia marcescens NOT DETECTED NOT DETECTED Final   Carbapenem resistance NOT DETECTED NOT DETECTED Final   Haemophilus influenzae NOT DETECTED NOT DETECTED Final   Neisseria meningitidis NOT DETECTED NOT DETECTED Final   Pseudomonas aeruginosa NOT DETECTED NOT DETECTED Final   Candida albicans NOT DETECTED NOT DETECTED Final   Candida glabrata NOT DETECTED NOT DETECTED Final   Candida krusei NOT DETECTED NOT DETECTED Final   Candida parapsilosis NOT DETECTED NOT DETECTED Final   Candida tropicalis NOT DETECTED NOT DETECTED Final  Culture, blood (Routine x 2)     Status: None (Preliminary result)   Collection Time: 04/02/16  6:17 AM  Result Value Ref Range Status   Specimen Description BLOOD LEFT FOREARM  Final   Special Requests BOTTLES DRAWN AEROBIC AND ANAEROBIC  12CC  Final   Culture NO GROWTH < 24 HOURS  Final   Report Status PENDING  Incomplete  Urine culture     Status: Abnormal (Preliminary result)   Collection Time: 04/02/16  7:14 AM  Result Value Ref Range Status   Specimen Description URINE, RANDOM  Final   Special Requests NONE  Final   Culture >=100,000 COLONIES/mL ESCHERICHIA COLI (A)  Final   Report Status PENDING  Incomplete    Radiology Reports Dg Chest 2 View  Result Date: 04/02/2016 CLINICAL DATA:  20 year old female with fever EXAM: CHEST  2 VIEW COMPARISON:  Chest radiograph dated 07/27/2011 FINDINGS: The heart size and mediastinal contours are within normal limits. Both lungs are clear. The visualized skeletal structures are unremarkable. IMPRESSION: No active cardiopulmonary  disease. Electronically Signed   By: Elgie Collard M.D.   On: 04/02/2016 06:46   Ct Renal Stone Study  Result Date: 04/02/2016 CLINICAL DATA:  Acute right flank pain, fever, sepsis EXAM: CT ABDOMEN AND PELVIS WITHOUT CONTRAST TECHNIQUE: Multidetector CT imaging of the abdomen and pelvis was performed following the standard protocol without IV contrast. COMPARISON:  05/16/2012 ultrasound FINDINGS: Lower chest: No acute abnormality. Hepatobiliary: No focal liver abnormality is seen. Status post cholecystectomy. No biliary dilatation. Pancreas: Unremarkable. No pancreatic ductal dilatation or surrounding inflammatory changes. Spleen: Normal in size without focal abnormality. Accessory splenule noted. Adrenals/Urinary Tract: Normal adrenal glands. Right kidney demonstrates perinephric strandy edema and mild pelviectasis. There is associated diffuse mild right hydroureter with periureteral stranding edema but no radiopaque obstructing urinary tract calculus. Appearance is compatible with ascending urinary tract infection (pyelonephritis) versus recently passed stone. Left kidney and ureter demonstrate no acute process or nephrolithiasis. Stomach/Bowel: Stomach is within normal limits. Appendix appears normal. No evidence of bowel wall thickening, distention, or inflammatory changes. Vascular/Lymphatic: No significant vascular findings are present. No enlarged abdominal or pelvic lymph nodes. Reproductive: Uterus and bilateral adnexa are unremarkable. Other: No abdominal wall hernia or abnormality. No abdominopelvic ascites. Musculoskeletal: No acute osseous finding. L5 demonstrates a unilateral right pars defects without associated anterior slippage. IMPRESSION: Right perinephric and periureteral stranding edema with mild right pelviectasis and diffuse right hydroureter. No associated radiopaque obstructing urinary tract calculus.  Appearance is compatible with pyelonephritis versus recently passed stone. Remote  cholecystectomy. Unilateral right L5 pars defect. Electronically Signed   By: Judie PetitM.  Shick M.D.   On: 04/02/2016 09:05     CBC  Recent Labs Lab 04/02/16 0603 04/03/16 0537  WBC 13.2* 8.7  HGB 13.2 11.8*  HCT 39.8 34.7*  PLT 266 197  MCV 91.1 91.2  MCH 30.3 30.9  MCHC 33.3 33.9  RDW 13.5 13.9  LYMPHSABS 0.6*  --   MONOABS 0.5  --   EOSABS 0.0  --   BASOSABS 0.1  --     Chemistries   Recent Labs Lab 04/02/16 0603 04/03/16 0537  NA 136 134*  K 3.1* 3.3*  CL 103 104  CO2 22 23  GLUCOSE 166* 100*  BUN 8 6  CREATININE 1.08* 0.81  CALCIUM 9.1 7.9*  AST 29  --   ALT 19  --   ALKPHOS 51  --   BILITOT 1.0  --    ------------------------------------------------------------------------------------------------------------------ estimated creatinine clearance is 109.8 mL/min (by C-G formula based on SCr of 0.81 mg/dL). ------------------------------------------------------------------------------------------------------------------ No results for input(s): HGBA1C in the last 72 hours. ------------------------------------------------------------------------------------------------------------------ No results for input(s): CHOL, HDL, LDLCALC, TRIG, CHOLHDL, LDLDIRECT in the last 72 hours. ------------------------------------------------------------------------------------------------------------------ No results for input(s): TSH, T4TOTAL, T3FREE, THYROIDAB in the last 72 hours.  Invalid input(s): FREET3 ------------------------------------------------------------------------------------------------------------------ No results for input(s): VITAMINB12, FOLATE, FERRITIN, TIBC, IRON, RETICCTPCT in the last 72 hours.  Coagulation profile No results for input(s): INR, PROTIME in the last 168 hours.  No results for input(s): DDIMER in the last 72 hours.  Cardiac Enzymes No results for input(s): CKMB, TROPONINI, MYOGLOBIN in the last 168 hours.  Invalid input(s):  CK ------------------------------------------------------------------------------------------------------------------ Invalid input(s): POCBNP    Assessment & Plan   20 year old Caucasian female without significant medical history presenting with flank pain  1.Sepsis,  Due to acute pyelonephritis Continue meropenem Await urine cultures  2. Hydronephrosis I will ask urology to see even though no stone is noted not certain if patient needs a stent  3. Hypokalemia: Replace goal 4-5     Code Status Orders        Start     Ordered   04/02/16 1024  Full code  Continuous     04/02/16 1024    Code Status History    Date Active Date Inactive Code Status Order ID Comments User Context   This patient has a current code status but no historical code status.           Consults Urology DVT Prophylaxis  Lovenox  Lab Results  Component Value Date   PLT 197 04/03/2016     Time Spent in minutes    35 minutesGreater than 50% of time spent in care coordination and counseling patient regarding the condition and plan of care.   Auburn BilberryPATEL, Riva Sesma M.D on 04/03/2016 at 1:38 PM  Between 7am to 6pm - Pager - [phone]  After 6pm go to [email or web address] - password [email or web address] [facility]  [facility]   Office  [phone]

## 2016-04-03 NOTE — Progress Notes (Signed)
PHARMACY - PHYSICIAN COMMUNICATION CRITICAL VALUE ALERT - BLOOD CULTURE IDENTIFICATION (BCID)  Results for orders placed or performed during the hospital encounter of 04/02/16  Blood Culture ID Panel (Reflexed) (Collected: 04/02/2016  6:03 AM)  Result Value Ref Range   Enterococcus species NOT DETECTED NOT DETECTED   Listeria monocytogenes NOT DETECTED NOT DETECTED   Staphylococcus species NOT DETECTED NOT DETECTED   Staphylococcus aureus NOT DETECTED NOT DETECTED   Streptococcus species NOT DETECTED NOT DETECTED   Streptococcus agalactiae NOT DETECTED NOT DETECTED   Streptococcus pneumoniae NOT DETECTED NOT DETECTED   Streptococcus pyogenes NOT DETECTED NOT DETECTED   Acinetobacter baumannii NOT DETECTED NOT DETECTED   Enterobacteriaceae species DETECTED (A) NOT DETECTED   Enterobacter cloacae complex NOT DETECTED NOT DETECTED   Escherichia coli DETECTED (A) NOT DETECTED   Klebsiella oxytoca NOT DETECTED NOT DETECTED   Klebsiella pneumoniae NOT DETECTED NOT DETECTED   Proteus species NOT DETECTED NOT DETECTED   Serratia marcescens NOT DETECTED NOT DETECTED   Carbapenem resistance NOT DETECTED NOT DETECTED   Haemophilus influenzae NOT DETECTED NOT DETECTED   Neisseria meningitidis NOT DETECTED NOT DETECTED   Pseudomonas aeruginosa NOT DETECTED NOT DETECTED   Candida albicans NOT DETECTED NOT DETECTED   Candida glabrata NOT DETECTED NOT DETECTED   Candida krusei NOT DETECTED NOT DETECTED   Candida parapsilosis NOT DETECTED NOT DETECTED   Candida tropicalis NOT DETECTED NOT DETECTED    Name of physician (or Provider) Contacted: Willis  Changes to prescribed antibiotics required: Changed to meropenem 1 gram q 8 hours .  Shayonna Ocampo S 04/03/2016  12:45 AM

## 2016-04-03 NOTE — Consult Note (Signed)
Urology Consult  I have been asked to see the patient by Dr. Allena Katz, for evaluation and management of pyelonephritis/ right pelviectasis with hydroureter  Chief Complaint: Right flank pain, fevers  History of Present Illness: Latoya Benson is a 20 y.o. year old female admitted yesterday to the medical service with fevers to 102, right flank pain, and a positive UA.  Prior to admission, she did have fevers, chills, nausea, vomiting, headache, and right flank pain for approximately 2 days. She ultimately presented to the emergency room for further evaluation.  Upon admission, she did have a leukocytosis which is improved with hydration and antibiotics.  She did also have a mildly elevated lactate which is also since normalized.  Blood in urine cultures are growing Escherichia coli.  CT scan (stone protocol) performed yesterday did show right-sided pelviectasis with hydroureter of the proximal portion with some peripelvic and periureteral stranding consistent with infection. There is otherwise no evidence of intraluminal obstruction and no stones.  She denies personal history of kidney stones.  She does have a history of urinary tract infections while pregnant last year. No history of pyelonephritis. No known congenital renal abnormalities.  Previous renal imaging includes a renal ultrasound in 2013 without hydronephrosis or stones.  Past Medical History:  Diagnosis Date  . Medical history non-contributory     Past Surgical History:  Procedure Laterality Date  . CHOLECYSTECTOMY    . COLON SURGERY      Home Medications:  Current Meds  Medication Sig  . acetaminophen (TYLENOL) 500 MG tablet Take 2,000 mg by mouth every 6 (six) hours as needed.    Allergies: No Known Allergies  Family History  Problem Relation Age of Onset  . Diabetes Neg Hx     Social History:  reports that she has quit smoking. She has never used smokeless tobacco. Her alcohol and drug histories are  not on file.  ROS: A complete review of systems was performed.  All systems are negative except for pertinent findings as noted.  Physical Exam:  Vital signs in last 24 hours: Temp:  [98.9 F (37.2 C)-100.5 F (38.1 C)] 99.9 F (37.7 C) (10/23 1214) Pulse Rate:  [106-117] 111 (10/23 1214) Resp:  [18-24] 18 (10/23 1214) BP: (112-124)/(58-73) 122/58 (10/23 1214) SpO2:  [97 %-100 %] 100 % (10/23 1214) Constitutional:  Alert and oriented, No acute distress.  Warm to touch.   HEENT: [place] AT, moist mucus membranes.  Trachea midline, no masses Cardiovascular: No clubbing, cyanosis, or edema. Respiratory: Normal respiratory effort, no WOB.   GI: Abdomen is soft, nontender, nondistended, no abdominal masses GU: + R CVA tenderness.   Skin: No rashes, bruises or suspicious lesions Neurologic: Grossly intact, no focal deficits, moving all 4 extremities Psychiatric: Normal mood and affect   Laboratory Data:   Recent Labs  04/02/16 0603 04/03/16 0537  WBC 13.2* 8.7  HGB 13.2 11.8*  HCT 39.8 34.7*    Recent Labs  04/02/16 0603 04/03/16 0537  NA 136 134*  K 3.1* 3.3*  CL 103 104  CO2 22 23  GLUCOSE 166* 100*  BUN 8 6  CREATININE 1.08* 0.81  CALCIUM 9.1 7.9*   No results for input(s): LABPT, INR in the last 72 hours. No results for input(s): LABURIN in the last 72 hours. Results for orders placed or performed during the hospital encounter of 04/02/16  Culture, blood (Routine x 2)     Status: None (Preliminary result)   Collection Time: 04/02/16  6:03 AM  Result Value Ref Range Status   Specimen Description BLOOD LEFT FOREARM  Final   Special Requests BOTTLES DRAWN AEROBIC AND ANAEROBIC [id number]  Final   Culture  Setup Time   Final    Organism ID to follow GRAM NEGATIVE RODS AEROBIC BOTTLE ONLY CRITICAL RESULT CALLED TO, READ BACK BY AND VERIFIED WITH: MATT MCBANE AT 2320 ON 04/02/16 RWW CONFIRMED BY RWW    Culture NO GROWTH 1 DAY  Final   Report Status PENDING   Incomplete  Blood Culture ID Panel (Reflexed)     Status: Abnormal   Collection Time: 04/02/16  6:03 AM  Result Value Ref Range Status   Enterococcus species NOT DETECTED NOT DETECTED Final   Listeria monocytogenes NOT DETECTED NOT DETECTED Final   Staphylococcus species NOT DETECTED NOT DETECTED Final   Staphylococcus aureus NOT DETECTED NOT DETECTED Final   Streptococcus species NOT DETECTED NOT DETECTED Final   Streptococcus agalactiae NOT DETECTED NOT DETECTED Final   Streptococcus pneumoniae NOT DETECTED NOT DETECTED Final   Streptococcus pyogenes NOT DETECTED NOT DETECTED Final   Acinetobacter baumannii NOT DETECTED NOT DETECTED Final   Enterobacteriaceae species DETECTED (A) NOT DETECTED Final    Comment: CRITICAL RESULT CALLED TO, READ BACK BY AND VERIFIED WITH: MATT MCBANE AT 2320 ON 04/02/16 RWW    Enterobacter cloacae complex NOT DETECTED NOT DETECTED Final   Escherichia coli DETECTED (A) NOT DETECTED Final    Comment: CRITICAL RESULT CALLED TO, READ BACK BY AND VERIFIED WITH: MATT MCBANE AT 2320 ON 04/02/16 RWW    Klebsiella oxytoca NOT DETECTED NOT DETECTED Final   Klebsiella pneumoniae NOT DETECTED NOT DETECTED Final   Proteus species NOT DETECTED NOT DETECTED Final   Serratia marcescens NOT DETECTED NOT DETECTED Final   Carbapenem resistance NOT DETECTED NOT DETECTED Final   Haemophilus influenzae NOT DETECTED NOT DETECTED Final   Neisseria meningitidis NOT DETECTED NOT DETECTED Final   Pseudomonas aeruginosa NOT DETECTED NOT DETECTED Final   Candida albicans NOT DETECTED NOT DETECTED Final   Candida glabrata NOT DETECTED NOT DETECTED Final   Candida krusei NOT DETECTED NOT DETECTED Final   Candida parapsilosis NOT DETECTED NOT DETECTED Final   Candida tropicalis NOT DETECTED NOT DETECTED Final  Culture, blood (Routine x 2)     Status: None (Preliminary result)   Collection Time: 04/02/16  6:17 AM  Result Value Ref Range Status   Specimen Description BLOOD LEFT  FOREARM  Final   Special Requests BOTTLES DRAWN AEROBIC AND ANAEROBIC  12CC  Final   Culture NO GROWTH < 24 HOURS  Final   Report Status PENDING  Incomplete  Urine culture     Status: Abnormal (Preliminary result)   Collection Time: 04/02/16  7:14 AM  Result Value Ref Range Status   Specimen Description URINE, RANDOM  Final   Special Requests NONE  Final   Culture >=100,000 COLONIES/mL ESCHERICHIA COLI (A)  Final   Report Status PENDING  Incomplete   UA from 04/02/16 reviewed.  Radiologic Imaging: Dg Chest 2 View  Result Date: 04/02/2016 CLINICAL DATA:  20 year old female with fever EXAM: CHEST  2 VIEW COMPARISON:  Chest radiograph dated 07/27/2011 FINDINGS: The heart size and mediastinal contours are within normal limits. Both lungs are clear. The visualized skeletal structures are unremarkable. IMPRESSION: No active cardiopulmonary disease. Electronically Signed   By: Elgie Collard M.D.   On: 04/02/2016 06:46   Ct Renal Stone Study  Result Date: 04/02/2016 CLINICAL DATA:  Acute right  flank pain, fever, sepsis EXAM: CT ABDOMEN AND PELVIS WITHOUT CONTRAST TECHNIQUE: Multidetector CT imaging of the abdomen and pelvis was performed following the standard protocol without IV contrast. COMPARISON:  05/16/2012 ultrasound FINDINGS: Lower chest: No acute abnormality. Hepatobiliary: No focal liver abnormality is seen. Status post cholecystectomy. No biliary dilatation. Pancreas: Unremarkable. No pancreatic ductal dilatation or surrounding inflammatory changes. Spleen: Normal in size without focal abnormality. Accessory splenule noted. Adrenals/Urinary Tract: Normal adrenal glands. Right kidney demonstrates perinephric strandy edema and mild pelviectasis. There is associated diffuse mild right hydroureter with periureteral stranding edema but no radiopaque obstructing urinary tract calculus. Appearance is compatible with ascending urinary tract infection (pyelonephritis) versus recently passed  stone. Left kidney and ureter demonstrate no acute process or nephrolithiasis. Stomach/Bowel: Stomach is within normal limits. Appendix appears normal. No evidence of bowel wall thickening, distention, or inflammatory changes. Vascular/Lymphatic: No significant vascular findings are present. No enlarged abdominal or pelvic lymph nodes. Reproductive: Uterus and bilateral adnexa are unremarkable. Other: No abdominal wall hernia or abnormality. No abdominopelvic ascites. Musculoskeletal: No acute osseous finding. L5 demonstrates a unilateral right pars defects without associated anterior slippage. IMPRESSION: Right perinephric and periureteral stranding edema with mild right pelviectasis and diffuse right hydroureter. No associated radiopaque obstructing urinary tract calculus. Appearance is compatible with pyelonephritis versus recently passed stone. Remote cholecystectomy. Unilateral right L5 pars defect. Electronically Signed   By: Judie PetitM.  Shick M.D.   On: 04/02/2016 09:05   CT above reviewed as well as previous renal ultrasound.    Impression/Assessment:  20 year old female admitted with fevers, right flank pain, and positive UA growing Escherichia coli in both her urine and blood consistent with clinical right pyelonephritis. CT scan shows evidence of right perinephric and ureteral edema with mild pelviectasis and hydroureter which is consistent with a functional dilation related to endotoxin and not a result of intraluminal obstruction.  Leukocytosis and fever curve are improving. She is otherwise hemodynamically stable.  Plan:  -No role for intervention at this time, we'll continue to follow clinically and assess for ongoing improvement -Continue IV antibiotics adjusted as appropriate based on culture and sensitivity data -If patient continues to spike fevers for greater than 72 hours, consider renal ultrasound to assess for possible evolving abscess -In the setting of worsening clinical status  including ongoing tachycardia not associated with fevers, hypotension, and ongoing high fevers, may consider drainage although not indicated at this time.  04/03/2016, 4:12 PM  Vanna ScotlandAshley Margarito Dehaas,  MD   Thank you for involving me in this patient's care.  I will continue to follow along.  Please page with any further questions or concerns.

## 2016-04-03 NOTE — Progress Notes (Signed)
Pt was alert and sitting up in bed. 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 CH offered prayer.   04/03/16 1000  Clinical Encounter Type  Visited With Patient  Visit Type Initial  Spiritual Encounters  Spiritual Needs Prayer;Emotional  Stress Factors  Patient Stress Factors Health changes

## 2016-04-04 LAB — BASIC METABOLIC PANEL
ANION GAP: 8 (ref 5–15)
BUN: 6 mg/dL (ref 6–20)
CHLORIDE: 104 mmol/L (ref 101–111)
CO2: 24 mmol/L (ref 22–32)
Calcium: 8.6 mg/dL — ABNORMAL LOW (ref 8.9–10.3)
Creatinine, Ser: 0.84 mg/dL (ref 0.44–1.00)
GFR calc Af Amer: >60 mL/min (ref >60)
GLUCOSE: 87 mg/dL (ref 65–99)
POTASSIUM: 3.8 mmol/L (ref 3.5–5.1)
Sodium: 136 mmol/L (ref 135–145)

## 2016-04-04 LAB — URINE CULTURE: Culture: >=100000 — AB

## 2016-04-04 LAB — CBC
HEMATOCRIT: 34.5 % — AB (ref 35.0–47.0)
HEMOGLOBIN: 11.8 g/dL — AB (ref 12.0–16.0)
MCH: 31.2 pg (ref 26.0–34.0)
MCHC: 34.2 g/dL (ref 32.0–36.0)
MCV: 91.4 fL (ref 80.0–100.0)
Platelets: 196 10*3/uL (ref 150–440)
RBC: 3.77 MIL/uL — AB (ref 3.80–5.20)
RDW: 13.7 % (ref 11.5–14.5)
WBC: 4.5 10*3/uL (ref 3.6–11.0)

## 2016-04-04 MED ORDER — CEFTRIAXONE SODIUM-DEXTROSE 1-3.74 GM-% IV SOLR
1.0000 g | INTRAVENOUS | Status: DC
  Administered 2016-04-04: 1 g via INTRAVENOUS
  Filled 2016-04-04 (×2): qty 50

## 2016-04-04 MED ORDER — PROCHLORPERAZINE EDISYLATE 5 MG/ML IJ SOLN
10.0000 mg | Freq: Once | INTRAMUSCULAR | Status: AC
  Administered 2016-04-04: 10 mg via INTRAVENOUS
  Filled 2016-04-04: qty 2

## 2016-04-04 MED ORDER — MEROPENEM-SODIUM CHLORIDE 1 GM/50ML IV SOLR
1.0000 g | Freq: Three times a day (TID) | INTRAVENOUS | Status: DC
  Filled 2016-04-04: qty 50

## 2016-04-04 MED ORDER — METHOCARBAMOL 1000 MG/10ML IJ SOLN
500.0000 mg | Freq: Once | INTRAVENOUS | Status: AC
  Administered 2016-04-04: 500 mg via INTRAVENOUS
  Filled 2016-04-04: qty 5

## 2016-04-04 MED ORDER — VALPROATE SODIUM 500 MG/5ML IV SOLN
500.0000 mg | Freq: Once | INTRAVENOUS | Status: AC
  Administered 2016-04-04: 500 mg via INTRAVENOUS
  Filled 2016-04-04: qty 5

## 2016-04-04 MED ORDER — DEXTROSE 5 % IV SOLN: 1.0000 g | INTRAVENOUS | Status: DC

## 2016-04-04 NOTE — Progress Notes (Signed)
Patient's fever curve continues to improve., Tmax 99.9 over past 24 hours and no longer tachycardic which is reassuring.    UCx growing pan sensitive E. Coli.    Urology will sign off as no intervention needed.  Please page with questions or concerns.    Vanna ScotlandAshley Vladislav Axelson, MD

## 2016-04-04 NOTE — Progress Notes (Signed)
[facility] [place] at [facility]                                                                                                                                                                                            Patient Demographics   Latoya Guttierrez, is a 20 y.o. female, DOB - 24-Mar-1996, ZOX:[id number]  Admit date - 04/02/2016   Admitting Physician Wyatt Haste, MD  Outpatient Primary MD for the patient is No PCP Per Patient   LOS - 2  Subjective: Patient feeling better flank pain is improved temperature going down. No chest pain or shortness of breath continues to complain of a headache   Review of Systems:   CONSTITUTIONAL: No documented fever. No fatigue, weakness. No weight gain, no weight loss.  EYES: No blurry or double vision.  ENT: No tinnitus. No postnasal drip. No redness of the oropharynx.  RESPIRATORY: No cough, no wheeze, no hemoptysis. No dyspnea.  CARDIOVASCULAR: No chest pain. No orthopnea. No palpitations. No syncope.  GASTROINTESTINAL: No nausea, no vomiting or diarrhea. No abdominal pain. No melena or hematochezia.  GENITOURINARY: Positive dysuria Positive hematuria. Positive flank pain ENDOCRINE: No polyuria or nocturia. No heat or cold intolerance.  HEMATOLOGY: No anemia. No bruising. No bleeding.  INTEGUMENTARY: No rashes. No lesions.  MUSCULOSKELETAL: No arthritis. No swelling. No gout. Resolved flank pain NEUROLOGIC: No numbness, tingling, or ataxia. No seizure-type activity. Positive headache PSYCHIATRIC: No anxiety. No insomnia. No ADD.    Vitals:   Vitals:   04/03/16 1214 04/03/16 2002 04/03/16 2116 04/04/16 0511  BP: (!) 122/58  134/89 120/65  Pulse: (!) 111  95 92  Resp: 18  20 (!) 22  Temp: 99.9 F (37.7 C) 99 F (37.2 C) 98.6 F (37 C) 98.7 F (37.1 C)  TempSrc: Oral Oral Oral Oral  SpO2: 100%  100% 99%  Weight:      Height:        Wt Readings from Last 3 Encounters:  04/02/16 183 lb (83 kg)      Intake/Output Summary (Last 24 hours) at 04/04/16 1211 Last data filed at 04/04/16 1100  Gross per 24 hour  Intake             2578 ml  Output             1950 ml  Net              628 ml    Physical Exam:   GENERAL: Pleasant-appearing in no apparent distress.  HEAD, EYES, EARS, NOSE AND THROAT: Atraumatic, normocephalic. Extraocular muscles are intact. Pupils equal and  reactive to light. Sclerae anicteric. No conjunctival injection. No oro-pharyngeal erythema.  NECK: Supple. There is no jugular venous distention. No bruits, no lymphadenopathy, no thyromegaly.  HEART: Regular rate and rhythm,. No murmurs, no rubs, no clicks.  LUNGS: Clear to auscultation bilaterally. No rales or rhonchi. No wheezes.  ABDOMEN: Soft, flat, nontender, nondistended. Has good bowel sounds. No hepatosplenomegaly appreciated. Positive flank tenderness EXTREMITIES: No evidence of any cyanosis, clubbing, or peripheral edema.  +2 pedal and radial pulses bilaterally.  NEUROLOGIC: The patient is alert, awake, and oriented x3 with no focal motor or sensory deficits appreciated bilaterally.  SKIN: Moist and warm with no rashes appreciated.  Psych: Not anxious, depressed LN: No inguinal LN enlargement    Antibiotics   Anti-infectives    Start     Dose/Rate Route Frequency Ordered Stop   04/04/16 1800  Meropenem-Sodium Chloride SOLR 1 g  Status:  Discontinued     1 g 100 mL/hr  Intravenous Every 8 hours 04/04/16 1037 04/04/16 1113   04/04/16 1200  cefTRIAXone (ROCEPHIN) IVPB 1 g     1 g 100 mL/hr over 30 Minutes Intravenous Every 24 hours 04/04/16 1117     04/04/16 1115  cefTRIAXone (ROCEPHIN) 1 g in dextrose 5 % 50 mL IVPB  Status:  Discontinued     1 g 100 mL/hr over 30 Minutes Intravenous Every 24 hours 04/04/16 1113 04/04/16 1117   04/03/16 1000  cefTRIAXone (ROCEPHIN) IVPB 2 g  Status:  Discontinued     2 g 100 mL/hr over 30 Minutes Intravenous Every 24 hours 04/02/16 0656 04/03/16 0043   04/03/16  0200  meropenem (MERREM) 1 g in sodium chloride 0.9 % 100 mL IVPB  Status:  Discontinued     1 g 200 mL/hr over 30 Minutes Intravenous Every 8 hours 04/03/16 0043 04/04/16 1037   04/02/16 0700  cefTRIAXone (ROCEPHIN) 2 g in dextrose 5 % 50 mL IVPB  Status:  Discontinued     2 g 100 mL/hr over 30 Minutes Intravenous  Once 04/02/16 0648 04/02/16 0651   04/02/16 0700  cefTRIAXone (ROCEPHIN) IVPB 2 g     2 g 100 mL/hr over 30 Minutes Intravenous  Once 04/02/16 0651 04/02/16 0737      Medications   Scheduled Meds: . cefTRIAXone  1 g Intravenous Q24H  . enoxaparin (LOVENOX) injection  40 mg Subcutaneous Q24H  . Influenza vac split quadrivalent PF  0.5 mL Intramuscular Tomorrow-1000  . methocarbamol (ROBAXIN)  IV  500 mg Intravenous Once  . prochlorperazine  10 mg Intravenous Once  . valproate sodium  500 mg Intravenous Once   Continuous Infusions: . sodium chloride 100 mL/hr at 04/04/16 0731   PRN Meds:.acetaminophen **OR** acetaminophen, alum & mag hydroxide-simeth, butalbital-acetaminophen-caffeine, morphine injection, ondansetron **OR** ondansetron (ZOFRAN) IV, oxyCODONE   Data Review:   Micro Results Recent Results (from the past 240 hour(s))  Culture, blood (Routine x 2)     Status: Abnormal (Preliminary result)   Collection Time: 04/02/16  6:03 AM  Result Value Ref Range Status   Specimen Description BLOOD LEFT FOREARM  Final   Special Requests BOTTLES DRAWN AEROBIC AND ANAEROBIC 6CCAERO,5CCANA  Final   Culture  Setup Time   Final    GRAM NEGATIVE RODS AEROBIC BOTTLE ONLY CRITICAL RESULT CALLED TO, READ BACK BY AND VERIFIED WITH: MATT MCBANE AT 2320 ON 04/02/16 RWW CONFIRMED BY RWW Performed at [facility]    Culture ESCHERICHIA COLI (A)  Final   Report Status PENDING  Incomplete  Blood Culture ID Panel (Reflexed)     Status: Abnormal   Collection Time: 04/02/16  6:03 AM  Result Value Ref Range Status   Enterococcus species NOT DETECTED NOT DETECTED Final    Listeria monocytogenes NOT DETECTED NOT DETECTED Final   Staphylococcus species NOT DETECTED NOT DETECTED Final   Staphylococcus aureus NOT DETECTED NOT DETECTED Final   Streptococcus species NOT DETECTED NOT DETECTED Final   Streptococcus agalactiae NOT DETECTED NOT DETECTED Final   Streptococcus pneumoniae NOT DETECTED NOT DETECTED Final   Streptococcus pyogenes NOT DETECTED NOT DETECTED Final   Acinetobacter baumannii NOT DETECTED NOT DETECTED Final   Enterobacteriaceae species DETECTED (A) NOT DETECTED Final    Comment: CRITICAL RESULT CALLED TO, READ BACK BY AND VERIFIED WITH: MATT MCBANE AT 2320 ON 04/02/16 RWW    Enterobacter cloacae complex NOT DETECTED NOT DETECTED Final   Escherichia coli DETECTED (A) NOT DETECTED Final    Comment: CRITICAL RESULT CALLED TO, READ BACK BY AND VERIFIED WITH: MATT MCBANE AT 2320 ON 04/02/16 RWW    Klebsiella oxytoca NOT DETECTED NOT DETECTED Final   Klebsiella pneumoniae NOT DETECTED NOT DETECTED Final   Proteus species NOT DETECTED NOT DETECTED Final   Serratia marcescens NOT DETECTED NOT DETECTED Final   Carbapenem resistance NOT DETECTED NOT DETECTED Final   Haemophilus influenzae NOT DETECTED NOT DETECTED Final   Neisseria meningitidis NOT DETECTED NOT DETECTED Final   Pseudomonas aeruginosa NOT DETECTED NOT DETECTED Final   Candida albicans NOT DETECTED NOT DETECTED Final   Candida glabrata NOT DETECTED NOT DETECTED Final   Candida krusei NOT DETECTED NOT DETECTED Final   Candida parapsilosis NOT DETECTED NOT DETECTED Final   Candida tropicalis NOT DETECTED NOT DETECTED Final  Culture, blood (Routine x 2)     Status: None (Preliminary result)   Collection Time: 04/02/16  6:17 AM  Result Value Ref Range Status   Specimen Description BLOOD LEFT FOREARM  Final   Special Requests BOTTLES DRAWN AEROBIC AND ANAEROBIC  12CC  Final   Culture NO GROWTH 2 DAYS  Final   Report Status PENDING  Incomplete  Urine culture     Status: Abnormal    Collection Time: 04/02/16  7:14 AM  Result Value Ref Range Status   Specimen Description URINE, RANDOM  Final   Special Requests NONE  Final   Culture >=100,000 COLONIES/mL ESCHERICHIA COLI (A)  Final   Report Status 04/04/2016 FINAL  Final   Organism ID, Bacteria ESCHERICHIA COLI (A)  Final      Susceptibility   Escherichia coli - MIC*    AMPICILLIN <=2 SENSITIVE Sensitive     CEFAZOLIN <=4 SENSITIVE Sensitive     CEFTRIAXONE <=1 SENSITIVE Sensitive     CIPROFLOXACIN <=0.25 SENSITIVE Sensitive     GENTAMICIN <=1 SENSITIVE Sensitive     IMIPENEM <=0.25 SENSITIVE Sensitive     NITROFURANTOIN <=16 SENSITIVE Sensitive     TRIMETH/SULFA <=20 SENSITIVE Sensitive     AMPICILLIN/SULBACTAM <=2 SENSITIVE Sensitive     PIP/TAZO <=4 SENSITIVE Sensitive     Extended ESBL NEGATIVE Sensitive     * >=100,000 COLONIES/mL ESCHERICHIA COLI    Radiology Reports Dg Chest 2 View  Result Date: 04/02/2016 CLINICAL DATA:  20 year old female with fever EXAM: CHEST  2 VIEW COMPARISON:  Chest radiograph dated 07/27/2011 FINDINGS: The heart size and mediastinal contours are within normal limits. Both lungs are clear. The visualized skeletal structures are unremarkable. IMPRESSION: No active cardiopulmonary disease. Electronically Signed  By: Elgie CollardArash  Radparvar M.D.   On: 04/02/2016 06:46   Ct Renal Stone Study  Result Date: 04/02/2016 CLINICAL DATA:  Acute right flank pain, fever, sepsis EXAM: CT ABDOMEN AND PELVIS WITHOUT CONTRAST TECHNIQUE: Multidetector CT imaging of the abdomen and pelvis was performed following the standard protocol without IV contrast. COMPARISON:  05/16/2012 ultrasound FINDINGS: Lower chest: No acute abnormality. Hepatobiliary: No focal liver abnormality is seen. Status post cholecystectomy. No biliary dilatation. Pancreas: Unremarkable. No pancreatic ductal dilatation or surrounding inflammatory changes. Spleen: Normal in size without focal abnormality. Accessory splenule noted.  Adrenals/Urinary Tract: Normal adrenal glands. Right kidney demonstrates perinephric strandy edema and mild pelviectasis. There is associated diffuse mild right hydroureter with periureteral stranding edema but no radiopaque obstructing urinary tract calculus. Appearance is compatible with ascending urinary tract infection (pyelonephritis) versus recently passed stone. Left kidney and ureter demonstrate no acute process or nephrolithiasis. Stomach/Bowel: Stomach is within normal limits. Appendix appears normal. No evidence of bowel wall thickening, distention, or inflammatory changes. Vascular/Lymphatic: No significant vascular findings are present. No enlarged abdominal or pelvic lymph nodes. Reproductive: Uterus and bilateral adnexa are unremarkable. Other: No abdominal wall hernia or abnormality. No abdominopelvic ascites. Musculoskeletal: No acute osseous finding. L5 demonstrates a unilateral right pars defects without associated anterior slippage. IMPRESSION: Right perinephric and periureteral stranding edema with mild right pelviectasis and diffuse right hydroureter. No associated radiopaque obstructing urinary tract calculus. Appearance is compatible with pyelonephritis versus recently passed stone. Remote cholecystectomy. Unilateral right L5 pars defect. Electronically Signed   By: Judie PetitM.  Shick M.D.   On: 04/02/2016 09:05     CBC  Recent Labs Lab 04/02/16 0603 04/03/16 0537 04/04/16 0459  WBC 13.2* 8.7 4.5  HGB 13.2 11.8* 11.8*  HCT 39.8 34.7* 34.5*  PLT 266 197 196  MCV 91.1 91.2 91.4  MCH 30.3 30.9 31.2  MCHC 33.3 33.9 34.2  RDW 13.5 13.9 13.7  LYMPHSABS 0.6*  --   --   MONOABS 0.5  --   --   EOSABS 0.0  --   --   BASOSABS 0.1  --   --     Chemistries   Recent Labs Lab 04/02/16 0603 04/03/16 0537 04/04/16 0459  NA 136 134* 136  K 3.1* 3.3* 3.8  CL 103 104 104  CO2 22 23 24   GLUCOSE 166* 100* 87  BUN 8 6 6   CREATININE 1.08* 0.81 0.84  CALCIUM 9.1 7.9* 8.6*  AST 29  --    --   ALT 19  --   --   ALKPHOS 51  --   --   BILITOT 1.0  --   --    ------------------------------------------------------------------------------------------------------------------ estimated creatinine clearance is 105.9 mL/min (by C-G formula based on SCr of 0.84 mg/dL). ------------------------------------------------------------------------------------------------------------------ No results for input(s): HGBA1C in the last 72 hours. ------------------------------------------------------------------------------------------------------------------ No results for input(s): CHOL, HDL, LDLCALC, TRIG, CHOLHDL, LDLDIRECT in the last 72 hours. ------------------------------------------------------------------------------------------------------------------ No results for input(s): TSH, T4TOTAL, T3FREE, THYROIDAB in the last 72 hours.  Invalid input(s): FREET3 ------------------------------------------------------------------------------------------------------------------ No results for input(s): VITAMINB12, FOLATE, FERRITIN, TIBC, IRON, RETICCTPCT in the last 72 hours.  Coagulation profile No results for input(s): INR, PROTIME in the last 168 hours.  No results for input(s): DDIMER in the last 72 hours.  Cardiac Enzymes No results for input(s): CKMB, TROPONINI, MYOGLOBIN in the last 168 hours.  Invalid input(s): CK ------------------------------------------------------------------------------------------------------------------ Invalid input(s): POCBNP    Assessment & Plan   20 year old Caucasian female without significant medical history presenting with flank pain  1.Sepsis,  Due to acute pyelonephritis Culture consistent with Escherichia coli Positive blood cultures I will change her to ceftriaxone  2. Hydronephrosis seen by urology and they state that no intervention is needed but if she continues to have fever then may need  renal ultrasound to assess for abscess  3.  Hypokalemia: Replaced  4. Headache could be migraine will try Compazine IV, IV Depakote and    Code Status Orders        Start     Ordered   04/02/16 1024  Full code  Continuous     04/02/16 1024    Code Status History    Date Active Date Inactive Code Status Order ID Comments User Context   This patient has a current code status but no historical code status.           Consults Urology DVT Prophylaxis  Lovenox  Lab Results  Component Value Date   PLT 196 04/04/2016     Time Spent in minutes    32 minutesGreater than 50% of time spent in care coordination and counseling patient regarding the condition and plan of care.   Auburn Bilberry M.D on 04/04/2016 at 12:11 PM  Between 7am to 6pm - Pager - [phone]  After 6pm go to [email or web address] - password [email or web address] [facility]  [facility]   Office  [phone]

## 2016-04-05 LAB — CULTURE, BLOOD (ROUTINE X 2)

## 2016-04-05 LAB — BASIC METABOLIC PANEL
Anion gap: 9 (ref 5–15)
BUN: 6 mg/dL (ref 6–20)
CALCIUM: 9 mg/dL (ref 8.9–10.3)
CO2: 25 mmol/L (ref 22–32)
Chloride: 103 mmol/L (ref 101–111)
Creatinine, Ser: 0.76 mg/dL (ref 0.44–1.00)
GFR calc Af Amer: >60 mL/min (ref >60)
GLUCOSE: 87 mg/dL (ref 65–99)
Potassium: 3.9 mmol/L (ref 3.5–5.1)
Sodium: 137 mmol/L (ref 135–145)

## 2016-04-05 MED ORDER — CEFUROXIME AXETIL 500 MG PO TABS: 500.0000 mg | ORAL_TABLET | Freq: Two times a day (BID) | ORAL | 0 refills | Status: DC

## 2016-04-05 MED ORDER — SULFAMETHOXAZOLE-TRIMETHOPRIM 800-160 MG PO TABS: 1.0000 | ORAL_TABLET | Freq: Two times a day (BID) | ORAL | 0 refills | Status: AC

## 2016-04-05 NOTE — Progress Notes (Signed)
04/05/2016  Latoya Benson to be D/C'd Home per MD order.  Discussed prescriptions and follow up appointments with the patient. Prescriptions given to patient, medication list explained in detail. Pt verbalized understanding.    Medication List    TAKE these medications   acetaminophen 500 MG tablet Commonly known as:  TYLENOL Take 2,000 mg by mouth every 6 (six) hours as needed.   sulfamethoxazole-trimethoprim 800-160 MG tablet Commonly known as:  BACTRIM DS,SEPTRA DS Take 1 tablet by mouth 2 (two) times daily.       Vitals:   04/04/16 2056 04/05/16 0511  BP: 127/69 118/73  Pulse: 88 99  Resp: 18 18  Temp: 99.6 F (37.6 C) 99 F (37.2 C)    Skin clean, dry and intact without evidence of skin break down, no evidence of skin tears noted. IV catheter discontinued intact. Site without signs and symptoms of complications. Dressing and pressure applied. Pt denies pain at this time. No complaints noted.  An After Visit Summary was printed and given to the patient. Patient escorted via WC, and D/C home via private auto.  Bradly Chrisougherty, Nashali Ditmer E

## 2016-04-05 NOTE — Care Management (Signed)
Patient admitted with pyelonephritis.  Patient states that [place] recently moved back to [place] 2 months ago.  Lives at home with her mother.  Patient is employed, however uninsured.  Patient to discharge on PO bactrim.  $10 [facility] of pocket at [facility] [employer].  Patient denies any issues obtaining medication at discharge.  Patient does not have PCP.  Provided application to [facility]. RNCM signing off

## 2016-04-05 NOTE — Progress Notes (Signed)
[facility]    Latoya CornfieldStephanie Benson was admitted to the Hospital on 04/02/2016 and Discharged  04/05/2016 and should be excused from work/school   for 5  days starting 04/02/2016 , may return to work/school without any restrictions.  Call Hilda LiasVivek Kamauri Kathol MD, [facility]  [phone] with questions.  Houston SirenSAINANI,Latoya Benson J M.D on 04/05/2016,at 10:34 AM

## 2016-04-05 NOTE — Discharge Summary (Signed)
[employer] - [place] [facility]   PATIENT NAME: Latoya Benson    MR#:  [id number]  DATE OF BIRTH:  [birth year]  DATE OF ADMISSION:  04/02/2016 ADMITTING PHYSICIAN: Wyatt Haste, MD  DATE OF DISCHARGE: 04/05/2016  1:14 PM  PRIMARY CARE PHYSICIAN: No PCP Per Patient    ADMISSION DIAGNOSIS:  Acute pyelonephritis [N10] Severe sepsis (HCC) [A41.9, R65.20]  DISCHARGE DIAGNOSIS:  Active Problems:   Sepsis (HCC)   SECONDARY DIAGNOSIS:   Past Medical History:  Diagnosis Date  . Medical history non-contributory     HOSPITAL COURSE:   20 year old female with no significant past medical history presented to the hospital due to back pain/right flank pain noted to have a UTI with acute pyelonephritis.  1. Sepsis-this was secondary to acute pyelonephritis. Patient's urine and blood cultures are positive for Escherichia coli. -Patient was treated with IV ceftriaxone in the hospital. She has clinically improved and is currently afebrile and hemolytic stable over the past 24 hours. She is being discharged on oral Bactrim for an additional 12 days.  2. Acute pyelonephritis with hydronephrosis-this was noticed on CT scan imaging on admission. A urology consult was obtained. They did not recommend any intervention as patient had no nephrolithiasis. The recommended continuing IV antibiotics and supportive care. -Patient with IV antibiotics IV fluids has improved. She is now being discharged on oral Bactrim for an additional 12 days.  DISCHARGE CONDITIONS:   Stable  CONSULTS OBTAINED:  Treatment Team:  Wyatt Haste, MD Marcine Matar, MD  DRUG ALLERGIES:  No Known Allergies  DISCHARGE MEDICATIONS:     Medication List    TAKE these medications   acetaminophen 500 MG tablet Commonly known as:  TYLENOL Take 2,000 mg by mouth every 6 (six) hours as needed.   sulfamethoxazole-trimethoprim 800-160 MG tablet Commonly known as:  BACTRIM DS,SEPTRA DS Take 1  tablet by mouth 2 (two) times daily.         DISCHARGE INSTRUCTIONS:   DIET:  Regular diet  DISCHARGE CONDITION:  Stable  ACTIVITY:  Activity as tolerated  OXYGEN:  Home Oxygen: No.   Oxygen Delivery: room air  DISCHARGE LOCATION:  home   If you experience worsening of your admission symptoms, develop shortness of breath, life threatening emergency, suicidal or homicidal thoughts you must seek medical attention immediately by calling 911 or calling your MD immediately  if symptoms less severe.  You Must read complete instructions/literature along with all the possible adverse reactions/side effects for all the Medicines you take and that have been prescribed to you. Take any new Medicines after you have completely understood and accpet all the possible adverse reactions/side effects.   Please note  You were cared for by a hospitalist during your hospital stay. If you have any questions about your discharge medications or the care you received while you were in the hospital after you are discharged, you can call the unit and asked to speak with the hospitalist on call if the hospitalist that took care of you is not available. Once you are discharged, your primary care physician will handle any further medical issues. Please note that NO REFILLS for any discharge medications will be authorized once you are discharged, as it is imperative that you return to your primary care physician (or establish a relationship with a primary care physician if you do not have one) for your aftercare needs so that they can reassess your need for medications and monitor your lab values.  Today   No abdominal pain, fever, nausea, vomiting. Feels better. Wants to go home.  VITAL SIGNS:  Blood pressure 118/73, pulse 99, temperature 99 F (37.2 C), temperature source Oral, resp. rate 18, height 5\' 2"  (1.575 m), weight 83 kg (183 lb), last menstrual period 03/12/2016, SpO2 100 %.  I/O:    Intake/Output Summary (Last 24 hours) at 04/05/16 1546 Last data filed at 04/04/16 1653  Gross per 24 hour  Intake               50 ml  Output                0 ml  Net               50 ml    PHYSICAL EXAMINATION:  GENERAL:  20 y.o.-year-old patient lying in the bed with no acute distress.  EYES: Pupils equal, round, reactive to light and accommodation. No scleral icterus. Extraocular muscles intact.  HEENT: Head atraumatic, normocephalic. Oropharynx and nasopharynx clear.  NECK:  Supple, no jugular venous distention. No thyroid enlargement, no tenderness.  LUNGS: Normal breath sounds bilaterally, no wheezing, rales,rhonchi. No use of accessory muscles of respiration.  CARDIOVASCULAR: S1, S2 normal. No murmurs, rubs, or gallops.  ABDOMEN: Soft, non-tender, non-distended. Bowel sounds present. No organomegaly or mass.  EXTREMITIES: No pedal edema, cyanosis, or clubbing.  NEUROLOGIC: Cranial nerves II through XII are intact. No focal motor or sensory defecits b/l.  PSYCHIATRIC: The patient is alert and oriented x 3. Good affect.  SKIN: No obvious rash, lesion, or ulcer.   DATA REVIEW:   CBC  Recent Labs Lab 04/04/16 0459  WBC 4.5  HGB 11.8*  HCT 34.5*  PLT 196    Chemistries   Recent Labs Lab 04/02/16 0603  04/05/16 0444  NA 136  < > 137  K 3.1*  < > 3.9  CL 103  < > 103  CO2 22  < > 25  GLUCOSE 166*  < > 87  BUN 8  < > 6  CREATININE 1.08*  < > 0.76  CALCIUM 9.1  < > 9.0  AST 29  --   --   ALT 19  --   --   ALKPHOS 51  --   --   BILITOT 1.0  --   --   < > = values in this interval not displayed.  Cardiac Enzymes No results for input(s): TROPONINI in the last 168 hours.  Microbiology Results  Results for orders placed or performed during the hospital encounter of 04/02/16  Culture, blood (Routine x 2)     Status: Abnormal   Collection Time: 04/02/16  6:03 AM  Result Value Ref Range Status   Specimen Description BLOOD LEFT FOREARM  Final   Special  Requests BOTTLES DRAWN AEROBIC AND ANAEROBIC 6CCAERO,5CCANA  Final   Culture  Setup Time   Final    GRAM NEGATIVE RODS AEROBIC BOTTLE ONLY CRITICAL RESULT CALLED TO, READ BACK BY AND VERIFIED WITH: MATT MCBANE AT 2320 ON 04/02/16 RWW CONFIRMED BY RWW Performed at [facility]    Culture ESCHERICHIA COLI (A)  Final   Report Status 04/05/2016 FINAL  Final   Organism ID, Bacteria ESCHERICHIA COLI  Final      Susceptibility   Escherichia coli - MIC*    AMPICILLIN <=2 SENSITIVE Sensitive     CEFAZOLIN <=4 SENSITIVE Sensitive     CEFEPIME <=1 SENSITIVE Sensitive     CEFTAZIDIME <=1 SENSITIVE Sensitive  CEFTRIAXONE <=1 SENSITIVE Sensitive     CIPROFLOXACIN <=0.25 SENSITIVE Sensitive     GENTAMICIN <=1 SENSITIVE Sensitive     IMIPENEM <=0.25 SENSITIVE Sensitive     TRIMETH/SULFA <=20 SENSITIVE Sensitive     AMPICILLIN/SULBACTAM <=2 SENSITIVE Sensitive     PIP/TAZO <=4 SENSITIVE Sensitive     Extended ESBL NEGATIVE Sensitive     * ESCHERICHIA COLI  Blood Culture ID Panel (Reflexed)     Status: Abnormal   Collection Time: 04/02/16  6:03 AM  Result Value Ref Range Status   Enterococcus species NOT DETECTED NOT DETECTED Final   Listeria monocytogenes NOT DETECTED NOT DETECTED Final   Staphylococcus species NOT DETECTED NOT DETECTED Final   Staphylococcus aureus NOT DETECTED NOT DETECTED Final   Streptococcus species NOT DETECTED NOT DETECTED Final   Streptococcus agalactiae NOT DETECTED NOT DETECTED Final   Streptococcus pneumoniae NOT DETECTED NOT DETECTED Final   Streptococcus pyogenes NOT DETECTED NOT DETECTED Final   Acinetobacter baumannii NOT DETECTED NOT DETECTED Final   Enterobacteriaceae species DETECTED (A) NOT DETECTED Final    Comment: CRITICAL RESULT CALLED TO, READ BACK BY AND VERIFIED WITH: MATT MCBANE AT 2320 ON 04/02/16 RWW    Enterobacter cloacae complex NOT DETECTED NOT DETECTED Final   Escherichia coli DETECTED (A) NOT DETECTED Final    Comment: CRITICAL  RESULT CALLED TO, READ BACK BY AND VERIFIED WITH: MATT MCBANE AT 2320 ON 04/02/16 RWW    Klebsiella oxytoca NOT DETECTED NOT DETECTED Final   Klebsiella pneumoniae NOT DETECTED NOT DETECTED Final   Proteus species NOT DETECTED NOT DETECTED Final   Serratia marcescens NOT DETECTED NOT DETECTED Final   Carbapenem resistance NOT DETECTED NOT DETECTED Final   Haemophilus influenzae NOT DETECTED NOT DETECTED Final   Neisseria meningitidis NOT DETECTED NOT DETECTED Final   Pseudomonas aeruginosa NOT DETECTED NOT DETECTED Final   Candida albicans NOT DETECTED NOT DETECTED Final   Candida glabrata NOT DETECTED NOT DETECTED Final   Candida krusei NOT DETECTED NOT DETECTED Final   Candida parapsilosis NOT DETECTED NOT DETECTED Final   Candida tropicalis NOT DETECTED NOT DETECTED Final  Culture, blood (Routine x 2)     Status: None (Preliminary result)   Collection Time: 04/02/16  6:17 AM  Result Value Ref Range Status   Specimen Description BLOOD LEFT FOREARM  Final   Special Requests BOTTLES DRAWN AEROBIC AND ANAEROBIC  12CC  Final   Culture NO GROWTH 3 DAYS  Final   Report Status PENDING  Incomplete  Urine culture     Status: Abnormal   Collection Time: 04/02/16  7:14 AM  Result Value Ref Range Status   Specimen Description URINE, RANDOM  Final   Special Requests NONE  Final   Culture >=100,000 COLONIES/mL ESCHERICHIA COLI (A)  Final   Report Status 04/04/2016 FINAL  Final   Organism ID, Bacteria ESCHERICHIA COLI (A)  Final      Susceptibility   Escherichia coli - MIC*    AMPICILLIN <=2 SENSITIVE Sensitive     CEFAZOLIN <=4 SENSITIVE Sensitive     CEFTRIAXONE <=1 SENSITIVE Sensitive     CIPROFLOXACIN <=0.25 SENSITIVE Sensitive     GENTAMICIN <=1 SENSITIVE Sensitive     IMIPENEM <=0.25 SENSITIVE Sensitive     NITROFURANTOIN <=16 SENSITIVE Sensitive     TRIMETH/SULFA <=20 SENSITIVE Sensitive     AMPICILLIN/SULBACTAM <=2 SENSITIVE Sensitive     PIP/TAZO <=4 SENSITIVE Sensitive      Extended ESBL NEGATIVE Sensitive     * >=  100,000 COLONIES/mL ESCHERICHIA COLI    RADIOLOGY:  No results found.    Management plans discussed with the patient, family and they are in agreement.  CODE STATUS:     Code Status Orders        Start     Ordered   04/02/16 1024  Full code  Continuous     04/02/16 1024    Code Status History    Date Active Date Inactive Code Status Order ID Comments User Context   This patient has a current code status but no historical code status.      TOTAL TIME TAKING CARE OF THIS PATIENT: 40 minutes.    Houston Siren M.D on 04/05/2016 at 3:46 PM  Between 7am to 6pm - Pager - [phone]  After 6pm go to [email or web address] - Scientist, research (life sciences) [facility]  Office  [phone]  CC: Primary care physician; No PCP Per Patient

## 2016-04-07 LAB — CULTURE, BLOOD (ROUTINE X 2): Culture: NO GROWTH

## 2016-07-02 ENCOUNTER — Encounter (HOSPITAL_COMMUNITY): Payer: Self-pay | Admitting: Emergency Medicine

## 2016-07-02 ENCOUNTER — Emergency Department (HOSPITAL_COMMUNITY)
Admission: EM | Admit: 2016-07-02 | Discharge: 2016-07-02 | Disposition: A | Discharge status: Home / Self Care | Payer: Self-pay | Attending: Physician Assistant | Admitting: Physician Assistant

## 2016-07-02 DIAGNOSIS — Z87891 Personal history of nicotine dependence: Secondary | ICD-10-CM | POA: Insufficient documentation

## 2016-07-02 DIAGNOSIS — K0889 Other specified disorders of teeth and supporting structures: Secondary | ICD-10-CM | POA: Insufficient documentation

## 2016-07-02 MED ORDER — PENICILLIN V POTASSIUM 500 MG PO TABS: 500.0000 mg | ORAL_TABLET | Freq: Three times a day (TID) | ORAL | 0 refills | Status: DC

## 2016-07-02 MED ORDER — TRAMADOL HCL 50 MG PO TABS: 50.0000 mg | ORAL_TABLET | Freq: Four times a day (QID) | ORAL | 0 refills | Status: DC | PRN

## 2016-07-02 MED ORDER — OXYCODONE-ACETAMINOPHEN 5-325 MG PO TABS
1.0000 | ORAL_TABLET | Freq: Once | ORAL | Status: AC
  Administered 2016-07-02: 1 via ORAL
  Filled 2016-07-02: qty 1
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
## 2016-07-02 NOTE — ED Provider Notes (Signed)
[facility]-EMERGENCY DEPT Provider Note   CSN: [id number] Arrival date & time: 07/02/16  1853  By signing my name below, I, Alyssa Grove, attest that this documentation has been prepared under the direction and in the presence of Ayme Short Randall An, MD. Electronically Signed: Alyssa Grove, ED Scribe. 07/02/16. 7:48 PM.   History   Chief Complaint Chief Complaint  Patient presents with  . Dental Pain   The history is provided by the patient. No language interpreter was used.   HPI Comments: Latoya Benson is a 21 y.o. female who presents to the Emergency Department complaining of gradual onset and worsening, constant, moderate right sided dental pain for 3 days. Pt reports associated right sided facial swelling and right ear pain. She has tried Tylenol and Orajel with no significant relief. She has no hx of prior dental infections. She has just recently moved and does not have a dentist. She denies any other complaints.  Past Medical History:  Diagnosis Date  . Medical history non-contributory     Patient Active Problem List   Diagnosis Date Noted  . Sepsis (HCC) 04/02/2016    Past Surgical History:  Procedure Laterality Date  . CHOLECYSTECTOMY    . COLON SURGERY      OB History    No data available       Home Medications    Prior to Admission medications   Medication Sig Start Date End Date Taking? Authorizing Provider  acetaminophen (TYLENOL) 500 MG tablet Take 2,000 mg by mouth every 6 (six) hours as needed.    Historical Provider, MD    Family History Family History  Problem Relation Age of Onset  . Diabetes Neg Hx     Social History Social History  Substance Use Topics  . Smoking status: Former Games developer  . Smokeless tobacco: Never Used  . Alcohol use Yes     Allergies   Patient has no known allergies.   Review of Systems Review of Systems  Constitutional: Negative for fever.  HENT: Positive for dental problem, ear pain and facial swelling.     All other systems reviewed and are negative.  Physical Exam Updated Vital Signs BP (!) 138/102 (BP Location: Right Arm)   Pulse 74   Temp 98.4 F (36.9 C) (Oral)   Resp 16   LMP 06/02/2016   SpO2 99%   Physical Exam  HENT:  Mild erythema to the right TM   ED Treatments / Results  DIAGNOSTIC STUDIES: Oxygen Saturation is 99% on RA, normal by my interpretation.    COORDINATION OF CARE: 7:49 PM Discussed treatment plan with pt at bedside which includes Penicillin and pt agreed to plan.   Labs (all labs ordered are listed, but only abnormal results are displayed) Labs Reviewed - No data to display  EKG  EKG Interpretation None       Radiology No results found.  Procedures Procedures (including critical care time)  Medications Ordered in ED Medications  oxyCODONE-acetaminophen (PERCOCET/ROXICET) 5-325 MG per tablet 1 tablet (not administered)     Initial Impression / Assessment and Plan / ED Course  I have reviewed the triage vital signs and the nursing notes.  Pertinent labs & imaging results that were available during my care of the patient were reviewed by me and considered in my medical decision making (see chart for details).     I personally performed the services described in this documentation, which was scribed in my presence. The recorded information has been reviewed and  is accurate.   Patient unable to give a good history. She states that she has pain on the right side of her face. Kind of in her ear, kind of in her mouth, kind of in her teeth. She reports mild swelling. No fevers. Eating drinking normally. I do not see any signs of infection. Mild erythema to the right TM. I did not see any visible swelling at this time. We will give her antibiotics in case an infection develops. We discussed that we cannot give any narcotics given the risk of addiction.  She reports she can call dentist in the morning. We'll give her PCPs in the area.   Final  Clinical Impressions(s) / ED Diagnoses   Final diagnoses:  None    New Prescriptions New Prescriptions   No medications on file     Imani Fiebelkorn Randall AnLyn Copelyn Widmer, MD 07/02/16 2008

## 2016-07-02 NOTE — ED Triage Notes (Signed)
C/o R sided dental pain x 3-4 days.  States pain is worse and now has R sided facial swelling.

## 2016-07-02 NOTE — Discharge Instructions (Signed)
We are unsure what is causing your pain. We will give the antibiotics in case yoyu develop an infection. Please use tramadol to help with you pain.  To find a primary care or specialty doctor please call [phone] or [phone] to access "[facility] Find a Doctor Service."  You may also go on the [facility] website at [email or web address]  There are also multiple [facility] practices throughout the [place] that are frequently accepting new patients. You may find a clinic that is close to your home and contact them.  [facility] -  [address] [place] [phone]  [facility] in [place] (also locations in [place] and [place]) -  [address] [place] [address] [phone]  [facility] -  [address] [place] [address] [phone]

## 2016-07-02 NOTE — ED Notes (Signed)
Patient is resting comfortably. 

## 2016-07-06 ENCOUNTER — Encounter (HOSPITAL_COMMUNITY): Payer: Self-pay | Admitting: Emergency Medicine

## 2016-07-06 ENCOUNTER — Emergency Department (HOSPITAL_COMMUNITY): Payer: No Typology Code available for payment source

## 2016-07-06 ENCOUNTER — Emergency Department (HOSPITAL_COMMUNITY)
Admission: EM | Admit: 2016-07-06 | Discharge: 2016-07-06 | Disposition: A | Discharge status: Home / Self Care | Payer: No Typology Code available for payment source | Attending: Emergency Medicine | Admitting: Emergency Medicine

## 2016-07-06 DIAGNOSIS — Y999 Unspecified external cause status: Secondary | ICD-10-CM | POA: Diagnosis not present

## 2016-07-06 DIAGNOSIS — Y939 Activity, unspecified: Secondary | ICD-10-CM | POA: Diagnosis not present

## 2016-07-06 DIAGNOSIS — Y92410 Unspecified street and highway as the place of occurrence of the external cause: Secondary | ICD-10-CM | POA: Diagnosis not present

## 2016-07-06 DIAGNOSIS — Z87891 Personal history of nicotine dependence: Secondary | ICD-10-CM | POA: Diagnosis not present

## 2016-07-06 DIAGNOSIS — S8011XA Contusion of right lower leg, initial encounter: Secondary | ICD-10-CM | POA: Diagnosis not present

## 2016-07-06 DIAGNOSIS — S8991XA Unspecified injury of right lower leg, initial encounter: Secondary | ICD-10-CM | POA: Diagnosis present

## 2016-07-06 LAB — I-STAT BETA HCG BLOOD, ED (MC, WL, AP ONLY): I-stat hCG, quantitative: <5 m[IU]/mL (ref <5)

## 2016-07-06 MED ORDER — IBUPROFEN 800 MG PO TABS
800.0000 mg | ORAL_TABLET | Freq: Once | ORAL | Status: AC
  Administered 2016-07-06: 800 mg via ORAL
  Filled 2016-07-06: qty 1

## 2016-07-06 NOTE — ED Notes (Signed)
Pt was seen walking out of ED on crutches. Pt reported that she had been discharged and received her d/c paperwork and continued to leave. Pt did not receive paperwork and did not sign for discharge.
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
## 2016-07-06 NOTE — Discharge Instructions (Signed)
You may alternate Tylenol 1000 mg every 6 hours as needed for pain and ibuprofen 800 mg every 8 hours as needed for pain.
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
## 2016-07-06 NOTE — Progress Notes (Signed)
Orthopedic Tech Progress Note Patient Details:  Latoya GingerStephanie L Benson Sep 25, 1995 [id number]  Ortho Devices Type of Ortho Device: Crutches Ortho Device/Splint Interventions: Ordered, Application   [name], [name] 07/06/2016, 7:09 AM

## 2016-07-11 ENCOUNTER — Emergency Department
Admission: EM | Admit: 2016-07-11 | Discharge: 2016-07-11 | Disposition: A | Discharge status: Home / Self Care | Payer: Self-pay | Attending: Emergency Medicine | Admitting: Emergency Medicine

## 2016-07-11 ENCOUNTER — Emergency Department: Payer: Self-pay

## 2016-07-11 DIAGNOSIS — Y999 Unspecified external cause status: Secondary | ICD-10-CM | POA: Insufficient documentation

## 2016-07-11 DIAGNOSIS — W2209XA Striking against other stationary object, initial encounter: Secondary | ICD-10-CM | POA: Insufficient documentation

## 2016-07-11 DIAGNOSIS — Y929 Unspecified place or not applicable: Secondary | ICD-10-CM | POA: Insufficient documentation

## 2016-07-11 DIAGNOSIS — Y939 Activity, unspecified: Secondary | ICD-10-CM | POA: Insufficient documentation

## 2016-07-11 DIAGNOSIS — Z87891 Personal history of nicotine dependence: Secondary | ICD-10-CM | POA: Insufficient documentation

## 2016-07-11 DIAGNOSIS — S9031XA Contusion of right foot, initial encounter: Secondary | ICD-10-CM | POA: Insufficient documentation

## 2016-07-11 MED ORDER — MELOXICAM 15 MG PO TABS: 15.0000 mg | ORAL_TABLET | Freq: Every day | ORAL | 0 refills | Status: DC

## 2016-07-11 MED ORDER — HYDROCODONE-ACETAMINOPHEN 5-325 MG PO TABS
1.0000 | ORAL_TABLET | Freq: Once | ORAL | Status: AC
  Administered 2016-07-11: 1 via ORAL
  Filled 2016-07-11: qty 1
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
## 2016-07-11 NOTE — ED Provider Notes (Signed)
[facility] Emergency Department Provider Note  ____________________________________________  Time seen: Approximately 7:54 PM  I have reviewed the triage vital signs and the nursing notes.   HISTORY  Chief Complaint Ankle Pain    HPI Latoya Benson is a 21 y.o. female who presents emergency department complaining of continual right ankle pain. Patient states that she accidentally shut her foot in a door approximately 2 weeks ago. She's had continued pain over the lateral right ankle since. Patient is a with toys on same but states that pain is constant, aching/throbbing. She reports taking intermittent use of Motrin with no relief. No other injury or complaint.   Past Medical History:  Diagnosis Date  . Medical history non-contributory     Patient Active Problem List   Diagnosis Date Noted  . Sepsis (HCC) 04/02/2016    Past Surgical History:  Procedure Laterality Date  . CHOLECYSTECTOMY    . COLON SURGERY      Prior to Admission medications   Medication Sig Start Date End Date Taking? Authorizing Provider  acetaminophen (TYLENOL) 500 MG tablet Take 2,000 mg by mouth every 6 (six) hours as needed.    Historical Provider, MD  meloxicam (MOBIC) 15 MG tablet Take 1 tablet (15 mg total) by mouth daily. 07/11/16   Delorise Royals Marina Boerner, PA-C  penicillin v potassium (VEETID) 500 MG tablet Take 1 tablet (500 mg total) by mouth 3 (three) times daily. 07/02/16   Courteney Lyn Mackuen, MD  traMADol (ULTRAM) 50 MG tablet Take 1 tablet (50 mg total) by mouth every 6 (six) hours as needed. 07/02/16   Courteney Lyn Mackuen, MD    Allergies Patient has no known allergies.  Family History  Problem Relation Age of Onset  . Diabetes Neg Hx     Social History Social History  Substance Use Topics  . Smoking status: Former Games developer  . Smokeless tobacco: Never Used  . Alcohol use Yes     Review of Systems  Constitutional: No fever/chills Cardiovascular:  no chest pain. Respiratory: no cough. No SOB. Musculoskeletal: Positive for right ankle pain. Skin: Negative for rash, abrasions, lacerations, ecchymosis. Neurological: Negative for headaches, focal weakness or numbness. 10-point ROS otherwise negative.  ____________________________________________   PHYSICAL EXAM:  VITAL SIGNS: ED Triage Vitals  Enc Vitals Group     BP 07/11/16 1728 124/65     Pulse Rate 07/11/16 1728 70     Resp 07/11/16 1728 18     Temp 07/11/16 1728 98.4 F (36.9 C)     Temp Source 07/11/16 1728 Oral     SpO2 07/11/16 1728 100 %     Weight 07/11/16 1729 183 lb (83 kg)     Height 07/11/16 1729 5\' 2"  (1.575 m)     Head Circumference --      Peak Flow --      Pain Score 07/11/16 1729 9     Pain Loc --      Pain Edu? --      Excl. in GC? --      Constitutional: Alert and oriented. Well appearing and in no acute distress. Eyes: Conjunctivae are normal. PERRL. EOMI. Head: Atraumatic. Neck: No stridor.    Cardiovascular: Normal rate, regular rhythm. Normal S1 and S2.  Good peripheral circulation. Respiratory: Normal respiratory effort without tachypnea or retractions. Lungs CTAB. Good air entry to the bases with no decreased or absent breath sounds. Musculoskeletal: Full range of motion to all extremities. No gross deformities appreciated.No gross deformity  or edema noted to the right ankle but inspection. Full range of motion to right ankle. Patient is tender to palpation over the talonavicular joint line. No palpable abnormality. Dorsalis pedis pulse intact. Sensation intact all 5 digits. Cap refill into all digits. Neurologic:  Normal speech and language. No gross focal neurologic deficits are appreciated.  Skin:  Skin is warm, dry and intact. No rash noted. Psychiatric: Mood and affect are normal. Speech and behavior are normal. Patient exhibits appropriate insight and judgement.   ____________________________________________   LABS (all labs ordered  are listed, but only abnormal results are displayed)  Labs Reviewed - No data to display ____________________________________________  EKG   ____________________________________________  RADIOLOGY Festus BarrenI, Burma Ketcher D Celie Desrochers, personally viewed and evaluated these images (plain radiographs) as part of my medical decision making, as well as reviewing the written report by the radiologist.  Dg Ankle Complete Right  Result Date: 07/11/2016 CLINICAL DATA:  Right ankle pain and swelling after injury last week. EXAM: RIGHT ANKLE - COMPLETE 3+ VIEW COMPARISON:  Radiographs of July 06, 2016. FINDINGS: There is no evidence of fracture, dislocation, or joint effusion. There is no evidence of arthropathy or other focal bone abnormality. Soft tissues are unremarkable. IMPRESSION: Normal right ankle. Electronically Signed   By: Lupita RaiderJames  Green Jr, M.D.   On: 07/11/2016 18:49    ____________________________________________    PROCEDURES  Procedure(s) performed:    Procedures    Medications  HYDROcodone-acetaminophen (NORCO/VICODIN) 5-325 MG per tablet 1 tablet (1 tablet Oral Given 07/11/16 2007)     ____________________________________________   INITIAL IMPRESSION / ASSESSMENT AND PLAN / ED COURSE  Pertinent labs & imaging results that were available during my care of the patient were reviewed by me and considered in my medical decision making (see chart for details).  Review of the [place] CSRS was performed in accordance of the [facility] prior to dispensing any controlled drugs.     Patient's diagnosis is consistent with right foot contusion. X-ray reveals no acute osseous abdomen benign. Exam is reassuring for no indication of acute ligamentous injury.. Patient will be discharged home with prescriptions for anti-inflammatories for symptom control. Patient is to follow up with podiatry as needed or otherwise directed. Patient is given ED precautions to return to the ED for any worsening or new  symptoms.     ____________________________________________  FINAL CLINICAL IMPRESSION(S) / ED DIAGNOSES  Final diagnoses:  Contusion of right foot, initial encounter      NEW MEDICATIONS STARTED DURING THIS VISIT:  Discharge Medication List as of 07/11/2016  8:00 PM    START taking these medications   Details  meloxicam (MOBIC) 15 MG tablet Take 1 tablet (15 mg total) by mouth daily., Starting Tue 07/11/2016, Print            This chart was dictated using voice recognition software/Dragon. Despite best efforts to proofread, errors can occur which can change the meaning. Any change was purely unintentional.    Racheal PatchesJonathan D Ainhoa Rallo, PA-C 07/11/16 2024    Nita Sicklearolina Veronese, MD 07/11/16 2249

## 2016-08-14 ENCOUNTER — Emergency Department (HOSPITAL_COMMUNITY)
Admission: EM | Admit: 2016-08-14 | Discharge: 2016-08-15 | Disposition: A | Discharge status: Home / Self Care | Payer: Self-pay | Attending: Emergency Medicine | Admitting: Emergency Medicine

## 2016-08-14 ENCOUNTER — Encounter (HOSPITAL_COMMUNITY): Payer: Self-pay | Admitting: Emergency Medicine

## 2016-08-14 DIAGNOSIS — Z79899 Other long term (current) drug therapy: Secondary | ICD-10-CM | POA: Insufficient documentation

## 2016-08-14 DIAGNOSIS — Z87891 Personal history of nicotine dependence: Secondary | ICD-10-CM | POA: Insufficient documentation

## 2016-08-14 DIAGNOSIS — R109 Unspecified abdominal pain: Secondary | ICD-10-CM | POA: Insufficient documentation

## 2016-08-14 DIAGNOSIS — Z5321 Procedure and treatment not carried out due to patient leaving prior to being seen by health care provider: Secondary | ICD-10-CM | POA: Insufficient documentation

## 2016-08-14 LAB — COMPREHENSIVE METABOLIC PANEL
ALBUMIN: 3.9 g/dL (ref 3.5–5.0)
ALT: 15 U/L (ref 14–54)
AST: 13 U/L — ABNORMAL LOW (ref 15–41)
Alkaline Phosphatase: 52 U/L (ref 38–126)
Anion gap: 10 (ref 5–15)
BUN: 7 mg/dL (ref 6–20)
CHLORIDE: 105 mmol/L (ref 101–111)
CO2: 23 mmol/L (ref 22–32)
CREATININE: 0.79 mg/dL (ref 0.44–1.00)
Calcium: 9.6 mg/dL (ref 8.9–10.3)
GFR calc non Af Amer: >60 mL/min (ref >60)
GLUCOSE: 101 mg/dL — AB (ref 65–99)
Potassium: 3.9 mmol/L (ref 3.5–5.1)
Sodium: 138 mmol/L (ref 135–145)
Total Bilirubin: 0.4 mg/dL (ref 0.3–1.2)
Total Protein: 7.5 g/dL (ref 6.5–8.1)

## 2016-08-14 LAB — LIPASE, BLOOD: LIPASE: 28 U/L (ref 11–51)

## 2016-08-14 LAB — CBC
HCT: 39.1 % (ref 36.0–46.0)
HEMOGLOBIN: 12.6 g/dL (ref 12.0–15.0)
MCH: 29.6 pg (ref 26.0–34.0)
MCHC: 32.2 g/dL (ref 30.0–36.0)
MCV: 91.8 fL (ref 78.0–100.0)
Platelets: 434 10*3/uL — ABNORMAL HIGH (ref 150–400)
RBC: 4.26 MIL/uL (ref 3.87–5.11)
RDW: 13.7 % (ref 11.5–15.5)
WBC: 10.5 10*3/uL (ref 4.0–10.5)

## 2016-08-14 LAB — URINALYSIS, ROUTINE W REFLEX MICROSCOPIC
Bilirubin Urine: NEGATIVE
GLUCOSE, UA: NEGATIVE mg/dL
HGB URINE DIPSTICK: NEGATIVE
Ketones, ur: NEGATIVE mg/dL
Leukocytes, UA: NEGATIVE
Nitrite: NEGATIVE
Protein, ur: NEGATIVE mg/dL
SPECIFIC GRAVITY, URINE: 1.01 (ref 1.005–1.030)
pH: 5 (ref 5.0–8.0)

## 2016-08-14 LAB — POC URINE PREG, ED: Preg Test, Ur: NEGATIVE

## 2016-08-14 NOTE — ED Triage Notes (Signed)
Pt reports sharp upper abdominal pain since yesterday. Reports decreased appetite, nausea, vomiting. Reports LMP prior to Christmas. States had positive pregnancy test. No prenatal care.

## 2016-08-15 ENCOUNTER — Inpatient Hospital Stay (HOSPITAL_COMMUNITY)
Admission: AD | Admit: 2016-08-15 | Discharge: 2016-08-15 | Disposition: A | Discharge status: Home / Self Care | Payer: Self-pay | Source: Ambulatory Visit | Attending: Family Medicine | Admitting: Family Medicine

## 2016-08-15 DIAGNOSIS — Z3202 Encounter for pregnancy test, result negative: Secondary | ICD-10-CM | POA: Insufficient documentation

## 2016-08-15 DIAGNOSIS — N912 Amenorrhea, unspecified: Secondary | ICD-10-CM | POA: Insufficient documentation

## 2016-08-15 DIAGNOSIS — Z87891 Personal history of nicotine dependence: Secondary | ICD-10-CM | POA: Insufficient documentation

## 2016-08-15 LAB — WET PREP, GENITAL
Sperm: NONE SEEN
Trich, Wet Prep: NONE SEEN
Yeast Wet Prep HPF POC: NONE SEEN

## 2016-08-15 LAB — URINALYSIS, ROUTINE W REFLEX MICROSCOPIC
Bilirubin Urine: NEGATIVE
GLUCOSE, UA: NEGATIVE mg/dL
HGB URINE DIPSTICK: NEGATIVE
KETONES UR: NEGATIVE mg/dL
LEUKOCYTES UA: NEGATIVE
Nitrite: NEGATIVE
PH: 5 (ref 5.0–8.0)
PROTEIN: NEGATIVE mg/dL
Specific Gravity, Urine: 1.011 (ref 1.005–1.030)

## 2016-08-15 LAB — POCT PREGNANCY, URINE: PREG TEST UR: NEGATIVE

## 2016-08-15 LAB — HCG, QUANTITATIVE, PREGNANCY: hCG, Beta Chain, Quant, S: 2 m[IU]/mL (ref <5)

## 2016-08-15 NOTE — MAU Provider Note (Signed)
History     CSN: [id number]  Arrival date and time: 08/15/16 7846   First Provider Initiated Contact with Patient 08/15/16 0033      Chief Complaint  Patient presents with  . Pelvic Pain   Latoya Benson is a 21 y.o. Who presents today with cramping. She states that she had a +UPT at home, and now she has had cramping x 2 days. Denies any bleeding.    Pelvic Pain  The patient's primary symptoms include pelvic pain. The patient's pertinent negatives include no vaginal discharge. This is a new problem. The current episode started yesterday. The problem occurs intermittently. The problem has been unchanged. Pain severity now: 8/10. The problem affects both sides. Associated symptoms include nausea and vomiting. Pertinent negatives include no chills, constipation, diarrhea, dysuria, fever, frequency or urgency. The vaginal discharge was normal. There has been no bleeding. Nothing aggravates the symptoms. She has tried nothing for the symptoms. Her menstrual history has been regular (LMP 05/31/17 ).   Past Medical History:  Diagnosis Date  . Medical history non-contributory     Past Surgical History:  Procedure Laterality Date  . CHOLECYSTECTOMY    . COLON SURGERY      Family History  Problem Relation Age of Onset  . Diabetes Neg Hx     Social History  Substance Use Topics  . Smoking status: Former Games developer  . Smokeless tobacco: Never Used  . Alcohol use Yes    Allergies: No Known Allergies  Prescriptions Prior to Admission  Medication Sig Dispense Refill Last Dose  . acetaminophen (TYLENOL) 500 MG tablet Take 2,000 mg by mouth every 6 (six) hours as needed.   prn at prn  . meloxicam (MOBIC) 15 MG tablet Take 1 tablet (15 mg total) by mouth daily. 30 tablet 0   . penicillin v potassium (VEETID) 500 MG tablet Take 1 tablet (500 mg total) by mouth 3 (three) times daily. 30 tablet 0   . traMADol (ULTRAM) 50 MG tablet Take 1 tablet (50 mg total) by mouth every 6 (six) hours  as needed. 11 tablet 0     Review of Systems  Constitutional: Negative for chills and fever.  Gastrointestinal: Positive for nausea and vomiting. Negative for constipation and diarrhea.  Genitourinary: Positive for pelvic pain. Negative for dysuria, frequency, urgency, vaginal bleeding and vaginal discharge.   Physical Exam   Last menstrual period 05/31/2016.  Physical Exam  Nursing note and vitals reviewed. Constitutional: She is oriented to person, place, and time. She appears well-developed and well-nourished. No distress.  HENT:  Head: Normocephalic.  Cardiovascular: Normal rate.   Respiratory: Effort normal.  GI: Soft. There is no tenderness. There is no rebound.  Neurological: She is alert and oriented to person, place, and time.  Skin: Skin is warm and dry.  Psychiatric: She has a normal mood and affect.   Results for orders placed or performed during the hospital encounter of 08/15/16 (from the past 24 hour(s))  Urinalysis, Routine w reflex microscopic     Status: Abnormal   Collection Time: 08/15/16 12:33 AM  Result Value Ref Range   Color, Urine YELLOW YELLOW   APPearance HAZY (A) CLEAR   Specific Gravity, Urine 1.011 1.005 - 1.030   pH 5.0 5.0 - 8.0   Glucose, UA NEGATIVE NEGATIVE mg/dL   Hgb urine dipstick NEGATIVE NEGATIVE   Bilirubin Urine NEGATIVE NEGATIVE   Ketones, ur NEGATIVE NEGATIVE mg/dL   Protein, ur NEGATIVE NEGATIVE mg/dL   Nitrite NEGATIVE  NEGATIVE   Leukocytes, UA NEGATIVE NEGATIVE  Wet prep, genital     Status: Abnormal   Collection Time: 08/15/16 12:42 AM  Result Value Ref Range   Yeast Wet Prep HPF POC NONE SEEN NONE SEEN   Trich, Wet Prep NONE SEEN NONE SEEN   Clue Cells Wet Prep HPF POC PRESENT (A) NONE SEEN   WBC, Wet Prep HPF POC MODERATE (A) NONE SEEN   Sperm NONE SEEN   Pregnancy, urine POC     Status: None   Collection Time: 08/15/16 12:48 AM  Result Value Ref Range   Preg Test, Ur NEGATIVE NEGATIVE  hCG, quantitative, pregnancy      Status: None   Collection Time: 08/15/16 12:56 AM  Result Value Ref Range   hCG, Beta Chain, Quant, S 2 <5 mIU/mL     MAU Course  Procedures  MDM   Assessment and Plan   1. Amenorrhea    DC home Comfort measures reviewed  RX: none  Return to MAU as needed FU with GYN. They will call you with an appointment   Follow-up Information    [facility]   Specialty:  Obstetrics and Gynecology Contact information: [address] [place] [id number] [phone]           Tawnya CrookHogan, Travonta Gill Donovan 08/15/2016, 12:38 AM

## 2016-08-15 NOTE — ED Notes (Signed)
Patient did not answer when called for vitals recheck

## 2016-08-15 NOTE — Discharge Instructions (Signed)
Secondary Amenorrhea Secondary amenorrhea is the stopping of menstrual flow for 3-6 months in a female who has previously had periods. There are many possible causes. Most of these causes are not serious. Usually, treating the underlying problem causing the loss of menses will return your periods to normal. What are the causes? Some common and uncommon causes of not menstruating include:  Malnutrition.  Low blood sugar (hypoglycemia).  Polycystic ovary disease.  Stress or fear.  Breastfeeding.  Hormone imbalance.  Ovarian failure.  Medicines.  Extreme obesity.  Cystic fibrosis.  Low body weight or drastic weight reduction from any cause.  Early menopause.  Removal of ovaries or uterus.  Contraceptives.  Illness.  Long-term (chronic) illnesses.  Cushing syndrome.  Thyroid problems.  Birth control pills, patches, or vaginal rings for birth control. What increases the risk? You may be at greater risk of secondary amenorrhea if:  You have a family history of this condition.  You have an eating disorder.  You do athletic training. How is this diagnosed? A diagnosis is made by your health care provider taking a medical history and doing a physical exam. This will include a pelvic exam to check for problems with your reproductive organs. Pregnancy must be ruled out. Often, numerous blood tests are done to measure different hormones in the body. Urine testing may be done. Specialized exams (ultrasound, CT scan, MRI, or hysteroscopy) may have to be done as well as measuring the body mass index (BMI). How is this treated? Treatment depends on the cause of the amenorrhea. If an eating disorder is present, this can be treated with an adequate diet and therapy. Chronic illnesses may improve with treatment of the illness. Amenorrhea may be corrected with medicines, lifestyle changes, or surgery. If the amenorrhea cannot be corrected, it is sometimes possible to create a false  menstruation with medicines. Follow these instructions at home:  Maintain a healthy diet.  Manage weight problems.  Exercise regularly but not excessively.  Get adequate sleep.  Manage stress.  Be aware of changes in your menstrual cycle. Keep a record of when your periods occur. Note the date your period starts, how long it lasts, and any problems. Contact a health care provider if: Your symptoms do not get better with treatment. This information is not intended to replace advice given to you by your health care provider. Make sure you discuss any questions you have with your health care provider. Document Released: 07/10/2006 Document Revised: 11/04/2015 Document Reviewed: 11/14/2012 Elsevier Interactive Patient Education  2017 [employer]vier Inc[employer]  

## 2016-08-15 NOTE — MAU Note (Signed)
Pt states she has not had a period since December 20th. +upt at home. Started having pain yesterday in lower abdomen that radiates to back. Has not taken anything. Has thick, white discharge, but denies bleeding.

## 2016-08-16 ENCOUNTER — Other Ambulatory Visit: Payer: Self-pay | Admitting: Student

## 2016-08-16 ENCOUNTER — Telehealth (HOSPITAL_COMMUNITY): Payer: Self-pay

## 2016-08-16 DIAGNOSIS — A749 Chlamydial infection, unspecified: Secondary | ICD-10-CM

## 2016-08-16 LAB — GC/CHLAMYDIA PROBE AMP (~~LOC~~) NOT AT ARMC
CHLAMYDIA, DNA PROBE: POSITIVE — AB
Neisseria Gonorrhea: NEGATIVE

## 2016-08-16 MED ORDER — AZITHROMYCIN 250 MG PO TABS: 1000.0000 mg | ORAL_TABLET | Freq: Once | ORAL | 0 refills | Status: AC

## 2016-08-16 NOTE — Telephone Encounter (Signed)

## 2016-08-23 ENCOUNTER — Other Ambulatory Visit: Payer: Self-pay | Admitting: Advanced Practice Midwife

## 2016-08-23 DIAGNOSIS — A749 Chlamydial infection, unspecified: Secondary | ICD-10-CM

## 2016-08-23 MED ORDER — AZITHROMYCIN 500 MG PO TABS: 1000.0000 mg | ORAL_TABLET | Freq: Once | ORAL | 0 refills | Status: AC

## 2016-09-04 ENCOUNTER — Ambulatory Visit: Payer: Self-pay | Admitting: Obstetrics and Gynecology

## 2016-09-05 ENCOUNTER — Encounter: Payer: Self-pay | Admitting: Obstetrics & Gynecology

## 2017-01-27 ENCOUNTER — Emergency Department: Payer: Self-pay

## 2017-01-27 ENCOUNTER — Encounter: Payer: Self-pay | Admitting: Emergency Medicine

## 2017-01-27 ENCOUNTER — Emergency Department
Admission: EM | Admit: 2017-01-27 | Discharge: 2017-01-27 | Disposition: A | Discharge status: Home / Self Care | Payer: Self-pay | Attending: Emergency Medicine | Admitting: Emergency Medicine

## 2017-01-27 DIAGNOSIS — Z79899 Other long term (current) drug therapy: Secondary | ICD-10-CM | POA: Insufficient documentation

## 2017-01-27 DIAGNOSIS — M79672 Pain in left foot: Secondary | ICD-10-CM | POA: Insufficient documentation

## 2017-01-27 DIAGNOSIS — Z87891 Personal history of nicotine dependence: Secondary | ICD-10-CM | POA: Insufficient documentation

## 2017-01-27 MED ORDER — MELOXICAM 15 MG PO TABS: 15.0000 mg | ORAL_TABLET | Freq: Every day | ORAL | 0 refills | Status: DC

## 2017-01-27 MED ORDER — MELOXICAM 15 MG PO TABS
15.0000 mg | ORAL_TABLET | Freq: Once | ORAL | Status: AC
  Administered 2017-01-27: 15 mg via ORAL
  Filled 2017-01-27: qty 2
  Filled 2017-01-27: qty 1

## 2017-01-27 NOTE — ED Triage Notes (Signed)
Pt reports left foot pain x3 days. Pt to ED this AM due to inability to stand on left foot. Swelling noted to left foot and pedal pulses present. Pt denies injury to area.

## 2017-01-27 NOTE — Discharge Instructions (Signed)
Please seek medical attention for any high fevers, chest pain, shortness of breath, change in behavior, persistent vomiting, bloody stool or any other new or concerning symptoms.  

## 2017-01-27 NOTE — ED Provider Notes (Signed)
[facility] Emergency Department Provider Note   ____________________________________________   I have reviewed the triage vital signs and the nursing notes.   HISTORY  Chief Complaint Foot Pain   History limited by: Not Limited   HPI Latoya Benson is a 21 y.o. female who presents to the emergency department today because of concerns for foot pain. Is located on the left side. It is located in the bottom, middle of her foot and her ankle. Patient states it all started a few days ago when she rolled her foot. She didn't have any immediate onset of pain and was able to walk on it the rest of the day. The next day however she started developing pain. It is been constant. It is been gradually getting worse. She states she had similar pain on her right foot earlier this year. She was told she had a bruised bone at that time.   Past Medical History:  Diagnosis Date  . Medical history non-contributory     Patient Active Problem List   Diagnosis Date Noted  . Sepsis (HCC) 04/02/2016    Past Surgical History:  Procedure Laterality Date  . CHOLECYSTECTOMY    . COLON SURGERY      Prior to Admission medications   Medication Sig Start Date End Date Taking? Authorizing Provider  acetaminophen (TYLENOL) 500 MG tablet Take 2,000 mg by mouth every 6 (six) hours as needed.    [provider]  meloxicam (MOBIC) 15 MG tablet Take 1 tablet (15 mg total) by mouth daily. 07/11/16   Cuthriell, Delorise Royals, PA-C  penicillin v potassium (VEETID) 500 MG tablet Take 1 tablet (500 mg total) by mouth 3 (three) times daily. 07/02/16   Mackuen, Courteney Lyn, MD  traMADol (ULTRAM) 50 MG tablet Take 1 tablet (50 mg total) by mouth every 6 (six) hours as needed. 07/02/16   Mackuen, Cindee Salt, MD    Allergies Patient has no known allergies.  Family History  Problem Relation Age of Onset  . Diabetes Neg Hx     Social History Social History  Substance Use  Topics  . Smoking status: Former Games developer  . Smokeless tobacco: Never Used  . Alcohol use Yes    Review of Systems Constitutional: No fever/chills Eyes: No visual changes. ENT: No sore throat. Cardiovascular: Denies chest pain. Respiratory: Denies shortness of breath. Gastrointestinal: No abdominal pain.  No nausea, no vomiting.  No diarrhea.   Genitourinary: Negative for dysuria. Musculoskeletal: Positive for left foot pain. Skin: Negative for rash. Neurological: Negative for headaches, focal weakness or numbness.  ____________________________________________   PHYSICAL EXAM:  VITAL SIGNS: ED Triage Vitals [01/27/17 0508]  Enc Vitals Group     BP 122/84     Pulse Rate 75     Resp 15     Temp 97.6 F (36.4 C)     Temp Source Oral     SpO2 99 %     Weight 180 lb (81.6 kg)     Height 5\' 2"  (1.575 m)   Constitutional: Alert and oriented. Well appearing and in no distress. Eyes: Conjunctivae are normal.  ENT   Head: Normocephalic and atraumatic.   Nose: No congestion/rhinnorhea.   Mouth/Throat: Mucous membranes are moist.   Neck: No stridor. Hematological/Lymphatic/Immunilogical: No cervical lymphadenopathy. Cardiovascular: Normal rate, regular rhythm.  No murmurs, rubs, or gallops.  Respiratory: Normal respiratory effort without tachypnea nor retractions. Breath sounds are clear and equal bilaterally. No wheezes/rales/rhonchi. Gastrointestinal: Soft and non tender. No  rebound. No guarding.  Genitourinary: Deferred Musculoskeletal: Question mild swelling to dorsum of left foot. Diffusely tender to palpation over the midfoot and ankle. DP 2+ Neurologic:  Normal speech and language. No gross focal neurologic deficits are appreciated.  Skin:  Skin is warm, dry and intact. No rash noted. Psychiatric: Mood and affect are normal. Speech and behavior are normal. Patient exhibits appropriate insight and judgment.  ____________________________________________     LABS (pertinent positives/negatives)  None  ____________________________________________   EKG  None  ____________________________________________    RADIOLOGY  Left foot IMPRESSION: Soft tissue swelling, no acute osseous process.  ____________________________________________   PROCEDURES  Procedures  ____________________________________________   INITIAL IMPRESSION / ASSESSMENT AND PLAN / ED COURSE  Pertinent labs & imaging results that were available during my care of the patient were reviewed by me and considered in my medical decision making (see chart for details).  Patient presented to the emergency department today because of concerns for left foot pain. X-ray negative. Patient is diffusely tender. Will discharge with pain medication. Will give patient a podiatry follow-up.  ____________________________________________   FINAL CLINICAL IMPRESSION(S) / ED DIAGNOSES  Final diagnoses:  Foot pain, left     Note: This dictation was prepared with Dragon dictation. Any transcriptional errors that result from this process are unintentional     Phineas Semen, MD 01/27/17 [phone]

## 2017-04-02 ENCOUNTER — Encounter: Payer: Self-pay | Admitting: Emergency Medicine

## 2017-04-02 ENCOUNTER — Emergency Department
Admission: EM | Admit: 2017-04-02 | Discharge: 2017-04-02 | Disposition: A | Discharge status: Home / Self Care | Payer: Self-pay | Attending: Emergency Medicine | Admitting: Emergency Medicine

## 2017-04-02 DIAGNOSIS — F17210 Nicotine dependence, cigarettes, uncomplicated: Secondary | ICD-10-CM | POA: Insufficient documentation

## 2017-04-02 DIAGNOSIS — H7291 Unspecified perforation of tympanic membrane, right ear: Secondary | ICD-10-CM | POA: Insufficient documentation

## 2017-04-02 DIAGNOSIS — Z79899 Other long term (current) drug therapy: Secondary | ICD-10-CM | POA: Insufficient documentation

## 2017-04-02 NOTE — ED Provider Notes (Signed)
[facility] Emergency Department Provider Note   ____________________________________________    I have reviewed the triage vital signs and the nursing notes.   HISTORY  Chief Complaint Otalgia     HPI Latoya Benson is a 21 y.o. female who presents with complaints of right ear pain. Patient reports 2 days ago she was using a Q-tip and felt a sharp pain in her right ear. Since then she has had decreased hearing and a sense of fullness in her right ear. No headache. No nausea or vomiting. No fevers or chills or discharge or bleeding   Past Medical History:  Diagnosis Date  . Medical history non-contributory     Patient Active Problem List   Diagnosis Date Noted  . Sepsis (HCC) 04/02/2016    Past Surgical History:  Procedure Laterality Date  . CHOLECYSTECTOMY    . COLON SURGERY    . 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 Authorizing Provider  acetaminophen (TYLENOL) 500 MG tablet Take 2,000 mg by mouth every 6 (six) hours as needed.    [provider]  meloxicam (MOBIC) 15 MG tablet Take 1 tablet (15 mg total) by mouth daily. 07/11/16   Cuthriell, Delorise Royals, PA-C  meloxicam (MOBIC) 15 MG tablet Take 1 tablet (15 mg total) by mouth daily. 01/27/17 01/27/18  Phineas Semen, MD  penicillin v potassium (VEETID) 500 MG tablet Take 1 tablet (500 mg total) by mouth 3 (three) times daily. 07/02/16   Mackuen, Courteney Lyn, MD  traMADol (ULTRAM) 50 MG tablet Take 1 tablet (50 mg total) by mouth every 6 (six) hours as needed. 07/02/16   Mackuen, Cindee Salt, MD     Allergies Patient has no known allergies.  Family History  Problem Relation Age of Onset  . Diabetes Neg Hx     Social History Social History  Substance Use Topics  . Smoking status: Current Some Day Smoker  . Smokeless tobacco: Never Used  . Alcohol use Yes    Review of  Systems  Constitutional: No fever/chills  ENT: Ear pain as above, no sore throat   Gastrointestinal: .  No nausea, no vomiting.    Musculoskeletal: Negative for back pain. Skin: Negative for rash. Neurological: Negative for headaches     ____________________________________________   PHYSICAL EXAM:  VITAL SIGNS: ED Triage Vitals  Enc Vitals Group     BP 04/02/17 1447 138/76     Pulse Rate 04/02/17 1447 70     Resp 04/02/17 1447 18     Temp 04/02/17 1447 98.5 F (36.9 C)     Temp Source 04/02/17 1447 Oral     SpO2 04/02/17 1447 100 %     Weight 04/02/17 1448 81.6 kg (180 lb)     Height 04/02/17 1448 1.575 m (5\' 2" )     Head Circumference --      Peak Flow --      Pain Score 04/02/17 1447 6     Pain Loc --      Pain Edu? --      Excl. in GC? --     Constitutional: Alert and oriented. No acute distress. Pleasant and interactive Eyes: Conjunctivae are normal.  Head: Atraumatic. Ears: Right ear TM appears perforated, no discharge Nose: No congestion/rhinnorhea. Mouth/Throat: Mucous membranes are moist.    Neurologic:  Normal speech and language. No gross focal neurologic deficits are appreciated.  Skin:  Skin is warm, dry and intact. No rash noted.   ____________________________________________   LABS (all labs ordered are listed, but only abnormal results are displayed)  Labs Reviewed - No data to display ____________________________________________  EKG   ____________________________________________  RADIOLOGY  None ____________________________________________   PROCEDURES  Procedure(s) performed:No    Critical Care performed: No ____________________________________________   INITIAL IMPRESSION / ASSESSMENT AND PLAN / ED COURSE  Pertinent labs & imaging results that were available during my care of the patient were reviewed by me and considered in my medical decision making (see chart for details).  Exam consistent with perforated TM,  likely traumatic, no indication for antibodies, follow up with ENT   ____________________________________________   FINAL CLINICAL IMPRESSION(S) / ED DIAGNOSES  Final diagnoses:  Tympanic membrane perforation, right      NEW MEDICATIONS STARTED DURING THIS VISIT:  Discharge Medication List as of 04/02/2017  4:26 PM       Note:  This document was prepared using Dragon voice recognition software and may include unintentional dictation errors.    Jene EveryKinner, Christna Kulick, MD 04/02/17 [phone]

## 2017-04-02 NOTE — ED Triage Notes (Signed)
R earache x 2 days. 

## 2017-04-02 NOTE — ED Notes (Signed)
EDP at bedside, states right ear pain, awake and alert in no acute distress

## 2017-06-30 ENCOUNTER — Emergency Department
Admission: EM | Admit: 2017-06-30 | Discharge: 2017-06-30 | Disposition: A | Discharge status: Home / Self Care | Payer: Self-pay | Attending: Emergency Medicine | Admitting: Emergency Medicine

## 2017-06-30 DIAGNOSIS — R1013 Epigastric pain: Secondary | ICD-10-CM | POA: Insufficient documentation

## 2017-06-30 DIAGNOSIS — K219 Gastro-esophageal reflux disease without esophagitis: Secondary | ICD-10-CM | POA: Insufficient documentation

## 2017-06-30 DIAGNOSIS — Z79899 Other long term (current) drug therapy: Secondary | ICD-10-CM | POA: Insufficient documentation

## 2017-06-30 DIAGNOSIS — F17210 Nicotine dependence, cigarettes, uncomplicated: Secondary | ICD-10-CM | POA: Insufficient documentation

## 2017-06-30 LAB — URINALYSIS, COMPLETE (UACMP) WITH MICROSCOPIC
Bilirubin Urine: NEGATIVE
GLUCOSE, UA: NEGATIVE mg/dL
Hgb urine dipstick: NEGATIVE
Ketones, ur: NEGATIVE mg/dL
Leukocytes, UA: NEGATIVE
NITRITE: NEGATIVE
PH: 5 (ref 5.0–8.0)
Protein, ur: NEGATIVE mg/dL
Specific Gravity, Urine: 1.016 (ref 1.005–1.030)

## 2017-06-30 LAB — COMPREHENSIVE METABOLIC PANEL
ALBUMIN: 4.2 g/dL (ref 3.5–5.0)
ALK PHOS: 59 U/L (ref 38–126)
ALT: 25 U/L (ref 14–54)
ANION GAP: 12 (ref 5–15)
AST: 46 U/L — ABNORMAL HIGH (ref 15–41)
BILIRUBIN TOTAL: 0.6 mg/dL (ref 0.3–1.2)
BUN: 12 mg/dL (ref 6–20)
CALCIUM: 9.1 mg/dL (ref 8.9–10.3)
CO2: 21 mmol/L — ABNORMAL LOW (ref 22–32)
Chloride: 107 mmol/L (ref 101–111)
Creatinine, Ser: 0.79 mg/dL (ref 0.44–1.00)
GFR calc Af Amer: >60 mL/min (ref >60)
GFR calc non Af Amer: >60 mL/min (ref >60)
GLUCOSE: 118 mg/dL — AB (ref 65–99)
Potassium: 3.7 mmol/L (ref 3.5–5.1)
Sodium: 140 mmol/L (ref 135–145)
TOTAL PROTEIN: 7.8 g/dL (ref 6.5–8.1)

## 2017-06-30 LAB — CBC
HCT: 37.3 % (ref 35.0–47.0)
HEMOGLOBIN: 12.7 g/dL (ref 12.0–16.0)
MCH: 30.5 pg (ref 26.0–34.0)
MCHC: 34.1 g/dL (ref 32.0–36.0)
MCV: 89.4 fL (ref 80.0–100.0)
Platelets: 375 10*3/uL (ref 150–440)
RBC: 4.17 MIL/uL (ref 3.80–5.20)
RDW: 13.4 % (ref 11.5–14.5)
WBC: 8.4 10*3/uL (ref 3.6–11.0)

## 2017-06-30 LAB — LIPASE, BLOOD: Lipase: 45 U/L (ref 11–51)

## 2017-06-30 LAB — POCT PREGNANCY, URINE: Preg Test, Ur: NEGATIVE

## 2017-06-30 MED ORDER — SUCRALFATE 1 GM/10ML PO SUSP: 1.0000 g | Freq: Four times a day (QID) | ORAL | 1 refills | Status: DC

## 2017-06-30 MED ORDER — SODIUM CHLORIDE 0.9 % IV BOLUS (SEPSIS)
1000.0000 mL | Freq: Once | INTRAVENOUS | Status: AC
  Administered 2017-06-30: 1000 mL via INTRAVENOUS

## 2017-06-30 MED ORDER — FAMOTIDINE IN NACL 20-0.9 MG/50ML-% IV SOLN
20.0000 mg | Freq: Once | INTRAVENOUS | Status: AC
  Administered 2017-06-30: 20 mg via INTRAVENOUS
  Filled 2017-06-30: qty 50

## 2017-06-30 MED ORDER — FENTANYL CITRATE (PF) 100 MCG/2ML IJ SOLN
50.0000 ug | Freq: Once | INTRAMUSCULAR | Status: AC
  Administered 2017-06-30: 50 ug via INTRAVENOUS
  Filled 2017-06-30: qty 2

## 2017-06-30 MED ORDER — FAMOTIDINE 20 MG PO TABS: 20.0000 mg | ORAL_TABLET | Freq: Two times a day (BID) | ORAL | 0 refills | Status: DC

## 2017-06-30 MED ORDER — ONDANSETRON HCL 4 MG/2ML IJ SOLN
4.0000 mg | Freq: Once | INTRAMUSCULAR | Status: AC
  Administered 2017-06-30: 4 mg via INTRAVENOUS
  Filled 2017-06-30: qty 2

## 2017-06-30 MED ORDER — ONDANSETRON 4 MG PO TBDP: 4.0000 mg | ORAL_TABLET | Freq: Three times a day (TID) | ORAL | 0 refills | Status: DC | PRN

## 2017-06-30 NOTE — ED Notes (Signed)
Per MD York CeriseForbach, no troponin or Chest Xray ordered at this time

## 2017-06-30 NOTE — ED Provider Notes (Signed)
[facility] Emergency Department Provider Note   ____________________________________________   First MD Initiated Contact with Patient 06/30/17 [phone]     (approximate)  I have reviewed the triage vital signs and the nursing notes.   HISTORY  Chief Complaint Abdominal Pain    HPI Latoya Benson is a 22 y.o. female who presents to the ED from home with a chief complaint of abdominal pain.  Patient reports epigastric burning and aching type pain radiating around to her back approximately midnight.  Symptoms associated with nausea, no vomiting.  Denies associated fever, chills, chest pain, shortness of breath, diarrhea.  Denies recent travel or trauma.  Similar symptoms previously.   Past Medical History:  Diagnosis Date  . Medical history non-contributory     Patient Active Problem List   Diagnosis Date Noted  . Sepsis (HCC) 04/02/2016    Past Surgical History:  Procedure Laterality Date  . CHOLECYSTECTOMY    . COLON SURGERY    . 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 Authorizing Provider  acetaminophen (TYLENOL) 500 MG tablet Take 2,000 mg by mouth every 6 (six) hours as needed.    [provider]  meloxicam (MOBIC) 15 MG tablet Take 1 tablet (15 mg total) by mouth daily. 07/11/16   Cuthriell, Delorise Royals, PA-C  meloxicam (MOBIC) 15 MG tablet Take 1 tablet (15 mg total) by mouth daily. 01/27/17 01/27/18  Phineas Semen, MD  penicillin v potassium (VEETID) 500 MG tablet Take 1 tablet (500 mg total) by mouth 3 (three) times daily. 07/02/16   Mackuen, Courteney Lyn, MD  traMADol (ULTRAM) 50 MG tablet Take 1 tablet (50 mg total) by mouth every 6 (six) hours as needed. 07/02/16   Mackuen, Cindee Salt, MD    Allergies Patient has no known allergies.  Family History  Problem Relation Age of Onset  . Diabetes Neg Hx     Social History Social History   Tobacco Use    . Smoking status: Current Some Day Smoker  . Smokeless tobacco: Never Used  Substance Use Topics  . Alcohol use: Yes  . Drug use: No    Review of Systems  Constitutional: No fever/chills. Eyes: No visual changes. ENT: No sore throat. Cardiovascular: Denies chest pain. Respiratory: Denies shortness of breath. Gastrointestinal: Positive for abdominal pain and nausea, no vomiting.  No diarrhea.  No constipation. Genitourinary: Negative for dysuria. Musculoskeletal: Negative for back pain. Skin: Negative for rash. Neurological: Negative for headaches, focal weakness or numbness.   ____________________________________________   PHYSICAL EXAM:  VITAL SIGNS: ED Triage Vitals  Enc Vitals Group     BP 06/30/17 0050 (!) 130/102     Pulse Rate 06/30/17 0050 67     Resp 06/30/17 0050 17     Temp 06/30/17 0050 97.7 F (36.5 C)     Temp Source 06/30/17 0050 Oral     SpO2 06/30/17 0050 100 %     Weight 06/30/17 0048 180 lb (81.6 kg)     Height --      Head Circumference --      Peak Flow --      Pain Score 06/30/17 0048 10     Pain Loc --      Pain Edu? --      Excl. in GC? --     Constitutional: Alert and oriented. Well appearing and in no acute distress. Eyes: Conjunctivae are  normal. PERRL. EOMI. Head: Atraumatic. Nose: No congestion/rhinnorhea. Mouth/Throat: Mucous membranes are moist.  Oropharynx non-erythematous. Neck: No stridor.   Cardiovascular: Normal rate, regular rhythm. Grossly normal heart sounds.  Good peripheral circulation. Respiratory: Normal respiratory effort.  No retractions. Lungs CTAB. Gastrointestinal: Soft and mildly tender to palpation epigastrium without rebound or guarding. No distention. No abdominal bruits. No CVA tenderness. Musculoskeletal: No lower extremity tenderness nor edema.  No joint effusions. Neurologic:  Normal speech and language. No gross focal neurologic deficits are appreciated. No gait instability. Skin:  Skin is warm, dry and  intact. No rash noted. Psychiatric: Mood and affect are normal. Speech and behavior are normal.  ____________________________________________   LABS (all labs ordered are listed, but only abnormal results are displayed)  Labs Reviewed  COMPREHENSIVE METABOLIC PANEL - Abnormal; Notable for the following components:      Result Value   CO2 21 (*)    Glucose, Bld 118 (*)    AST 46 (*)    All other components within normal limits  URINALYSIS, COMPLETE (UACMP) WITH MICROSCOPIC - Abnormal; Notable for the following components:   Color, Urine YELLOW (*)    APPearance HAZY (*)    Bacteria, UA RARE (*)    Squamous Epithelial / LPF 6-30 (*)    All other components within normal limits  LIPASE, BLOOD  CBC  POC URINE PREG, ED  POCT PREGNANCY, URINE   ____________________________________________  EKG  ED ECG REPORT I, SUNG,JADE J, the attending physician, personally viewed and interpreted this ECG.   Date: 06/30/2017  EKG Time: 0044  Rate: 60  Rhythm: normal EKG, normal sinus rhythm  Axis: Normal  Intervals:none  ST&T Change: Nonspecific  ____________________________________________  RADIOLOGY  No results found.  ____________________________________________   PROCEDURES  Procedure(s) performed: None  Procedures  Critical Care performed: No  ____________________________________________   INITIAL IMPRESSION / ASSESSMENT AND PLAN / ED COURSE  As part of my medical decision making, I reviewed the following data within the electronic MEDICAL RECORD NUMBER History obtained from family, Nursing notes reviewed and incorporated, Labs reviewed, EKG interpreted, Old chart reviewed and Notes from prior ED visits.   22 year old female who presents with epigastric pain; history of cholecystectomy. Differential diagnosis includes, but is not limited to, biliary disease (biliary colic, acute cholecystitis, cholangitis, choledocholithiasis, etc), intrathoracic causes for epigastric  abdominal pain including ACS, gastritis, duodenitis, pancreatitis, small bowel or large bowel obstruction, abdominal aortic aneurysm, hernia, and gastritis.  Clinically patient sounds like she may have an element of PUD.  Laboratory and urinalysis are are unremarkable.  Administer analgesia, antiemetic and Pepcid.  Will reassess.  Clinical Course as of Jun 30 606  Sat Jun 30, 2017  0603 Patient improved.  Strict return precautions given.  Patient and boyfriend verbalize understanding and agree with plan of care.  Additionally, they have been trying to get pregnant but have not been able to.  They have a time of questions regarding fertility.  Patient does not have a gynecologist.  Will refer to outpatient gynecology as a starting place.  [JS]    Clinical Course User Index [JS] Irean HongSung, Jade J, MD     ____________________________________________   FINAL CLINICAL IMPRESSION(S) / ED DIAGNOSES  Final diagnoses:  Epigastric pain  Gastroesophageal reflux disease, esophagitis presence not specified     ED Discharge Orders    None       Note:  This document was prepared using Dragon voice recognition software and may include unintentional dictation errors.  Irean Hong, MD 06/30/17 [phone]

## 2017-06-30 NOTE — Discharge Instructions (Signed)
1.  Start Pepcid 20 mg twice daily (#60). 2.  You may take Zofran as needed for nausea. 3.  Take Carafate before meals and at bedtime to improve your symptoms. 4.  Return to the ER for worsening symptoms, persistent vomiting, difficulty breathing or other concerns.

## 2017-06-30 NOTE — ED Triage Notes (Signed)
Patient c/o epigastric pain radiating to back beginning at approx 0015.

## 2017-07-26 ENCOUNTER — Encounter: Payer: Self-pay | Admitting: Emergency Medicine

## 2017-07-26 ENCOUNTER — Emergency Department
Admission: EM | Admit: 2017-07-26 | Discharge: 2017-07-26 | Disposition: A | Discharge status: Home / Self Care | Payer: Self-pay | Attending: Emergency Medicine | Admitting: Emergency Medicine

## 2017-07-26 DIAGNOSIS — Z79899 Other long term (current) drug therapy: Secondary | ICD-10-CM | POA: Insufficient documentation

## 2017-07-26 DIAGNOSIS — F17200 Nicotine dependence, unspecified, uncomplicated: Secondary | ICD-10-CM | POA: Insufficient documentation

## 2017-07-26 DIAGNOSIS — N760 Acute vaginitis: Secondary | ICD-10-CM | POA: Insufficient documentation

## 2017-07-26 DIAGNOSIS — B9689 Other specified bacterial agents as the cause of diseases classified elsewhere: Secondary | ICD-10-CM

## 2017-07-26 DIAGNOSIS — H6122 Impacted cerumen, left ear: Secondary | ICD-10-CM | POA: Insufficient documentation

## 2017-07-26 LAB — URINALYSIS, COMPLETE (UACMP) WITH MICROSCOPIC
Bilirubin Urine: NEGATIVE
GLUCOSE, UA: NEGATIVE mg/dL
HGB URINE DIPSTICK: NEGATIVE
Ketones, ur: NEGATIVE mg/dL
LEUKOCYTES UA: NEGATIVE
NITRITE: NEGATIVE
PH: 5 (ref 5.0–8.0)
Protein, ur: NEGATIVE mg/dL
SPECIFIC GRAVITY, URINE: 1.013 (ref 1.005–1.030)

## 2017-07-26 LAB — WET PREP, GENITAL
Sperm: NONE SEEN
Trich, Wet Prep: NONE SEEN
Yeast Wet Prep HPF POC: NONE SEEN

## 2017-07-26 LAB — CHLAMYDIA/NGC RT PCR (ARMC ONLY)
CHLAMYDIA TR: NOT DETECTED
N gonorrhoeae: NOT DETECTED

## 2017-07-26 LAB — POCT PREGNANCY, URINE: Preg Test, Ur: NEGATIVE

## 2017-07-26 MED ORDER — METRONIDAZOLE 500 MG PO TABS: 500.0000 mg | ORAL_TABLET | Freq: Three times a day (TID) | ORAL | 0 refills | Status: DC

## 2017-07-26 NOTE — ED Notes (Signed)
Patient denies ear pain at this time.

## 2017-07-26 NOTE — ED Triage Notes (Signed)
Presents with left ear pain for 2 days

## 2017-07-26 NOTE — Discharge Instructions (Signed)
Follow-up with your regular doctor if you are not better in 3 days.  Follow-up with the [facility] if needed.  Apply over-the-counter Debrox to help soften wax.

## 2017-07-26 NOTE — ED Provider Notes (Signed)
[facility] Emergency Department Provider Note  ____________________________________________   First MD Initiated Contact with Patient 07/26/17 1308     (approximate)  I have reviewed the triage vital signs and the nursing notes.   HISTORY  Chief Complaint Otalgia    HPI Latoya Benson is a 22 y.o. female complains of left ear pain.  States she cannot hear out of left ear.  She denies any drainage.  She also is complaining of vaginal odor.  Some white discharge.  No burning with urination but she does have frequent urination.  She denies any abdominal pain, she denies fever chills.  Past Medical History:  Diagnosis Date  . Medical history non-contributory     Patient Active Problem List   Diagnosis Date Noted  . Sepsis (HCC) 04/02/2016    Past Surgical History:  Procedure Laterality Date  . CHOLECYSTECTOMY    . COLON SURGERY    . 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 Authorizing Provider  acetaminophen (TYLENOL) 500 MG tablet Take 2,000 mg by mouth every 6 (six) hours as needed.    [provider]  famotidine (PEPCID) 20 MG tablet Take 1 tablet (20 mg total) by mouth 2 (two) times daily. 06/30/17   Irean Hong, MD  meloxicam (MOBIC) 15 MG tablet Take 1 tablet (15 mg total) by mouth daily. 07/11/16   Cuthriell, Delorise Royals, PA-C  meloxicam (MOBIC) 15 MG tablet Take 1 tablet (15 mg total) by mouth daily. 01/27/17 01/27/18  Phineas Semen, MD  metroNIDAZOLE (FLAGYL) 500 MG tablet Take 1 tablet (500 mg total) by mouth 3 (three) times daily. 07/26/17   Rino Hosea, Roselyn Bering, PA-C  ondansetron (ZOFRAN ODT) 4 MG disintegrating tablet Take 1 tablet (4 mg total) by mouth every 8 (eight) hours as needed for nausea or vomiting. 06/30/17   Irean Hong, MD  penicillin v potassium (VEETID) 500 MG tablet Take 1 tablet (500 mg total) by mouth 3 (three) times daily. 07/02/16   Mackuen,  Courteney Lyn, MD  sucralfate (CARAFATE) 1 GM/10ML suspension Take 10 mLs (1 g total) by mouth 4 (four) times daily. 06/30/17   Irean Hong, MD  traMADol (ULTRAM) 50 MG tablet Take 1 tablet (50 mg total) by mouth every 6 (six) hours as needed. 07/02/16   Mackuen, Cindee Salt, MD    Allergies Patient has no known allergies.  Family History  Problem Relation Age of Onset  . Diabetes Neg Hx     Social History Social History   Tobacco Use  . Smoking status: Current Some Day Smoker  . Smokeless tobacco: Never Used  Substance Use Topics  . Alcohol use: Yes  . Drug use: No    Review of Systems  Constitutional: No fever/chills Eyes: No visual changes. ENT: No sore throat.  Positive for left ear pain positive for decreased hearing Respiratory: Denies cough Cardiac: Denies chest pain GAstrointestinal: Denies abdominal pain Genitourinary: Negative for dysuria.  Positive for urinary frequency, positive for vaginal discharge, positive for vaginal odor Musculoskeletal: Negative for back pain. Skin: Negative for rash.    ____________________________________________   PHYSICAL EXAM:  VITAL SIGNS: ED Triage Vitals  Enc Vitals Group     BP 07/26/17 1212 110/70     Pulse Rate 07/26/17 1212 80     Resp 07/26/17 1212 18     Temp 07/26/17 1212 98 F (36.7 C)  Temp Source 07/26/17 1212 Oral     SpO2 07/26/17 1212 100 %     Weight 07/26/17 1210 180 lb (81.6 kg)     Height 07/26/17 1210 5\' 2"  (1.575 m)     Head Circumference --      Peak Flow --      Pain Score 07/26/17 1210 6     Pain Loc --      Pain Edu? --      Excl. in GC? --     Constitutional: Alert and oriented. Well appearing and in no acute distress. Eyes: Conjunctivae are normal.  Head: Atraumatic. Ears: Left ear with a cerumen impaction, after irrigation the left ear is within normal limits Nose: No congestion/rhinnorhea. Mouth/Throat: Mucous membranes are moist.   Cardiovascular: Normal rate, regular  rhythm. Respiratory: Normal respiratory effort.  No retractions GU: Pelvic exam shows no lesions externally.  Speculum exam shows some white vaginal discharge.  The cervix is not irritated.  There is no bleeding or ulcerations noted Musculoskeletal: FROM all extremities, warm and well perfused Neurologic:  Normal speech and language.  Skin:  Skin is warm, dry and intact. No rash noted. Psychiatric: Mood and affect are normal. Speech and behavior are normal.  ____________________________________________   LABS (all labs ordered are listed, but only abnormal results are displayed)  Labs Reviewed  WET PREP, GENITAL - Abnormal; Notable for the following components:      Result Value   Clue Cells Wet Prep HPF POC FEW (*)    WBC, Wet Prep HPF POC FEW (*)    All other components within normal limits  URINALYSIS, COMPLETE (UACMP) WITH MICROSCOPIC - Abnormal; Notable for the following components:   Color, Urine YELLOW (*)    APPearance CLEAR (*)    Bacteria, UA RARE (*)    Squamous Epithelial / LPF 0-5 (*)    All other components within normal limits  CHLAMYDIA/NGC RT PCR ([facility] ONLY)  POC URINE PREG, ED  POCT PREGNANCY, URINE   ____________________________________________   ____________________________________________  RADIOLOGY    ____________________________________________   PROCEDURES  Procedure(s) performed:   .Ear Cerumen Removal Date/Time: 07/26/2017 1:33 PM Performed by: Faythe Ghee, PA-C Authorized by: Faythe Ghee, PA-C   Consent:    Consent obtained:  Verbal   Consent given by:  Patient   Risks discussed:  Bleeding, infection, pain, TM perforation, incomplete removal and dizziness   Alternatives discussed:  No treatment Procedure details:    Location:  L ear   Procedure type: irrigation   Post-procedure details:    Inspection:  TM intact   Hearing quality:  Normal   Patient tolerance of procedure:  Tolerated well, no immediate  complications      ____________________________________________   INITIAL IMPRESSION / ASSESSMENT AND PLAN / ED COURSE  Pertinent labs & imaging results that were available during my care of the patient were reviewed by me and considered in my medical decision making (see chart for details).  Patient is a 22 year old female presented to the emergency department first complaining of left ear pain.  While in the exam room the patient wants to be checked for vaginal complaints,  On physical exam there is a cerumen impaction.  Pelvic exam was performed and there is some white vaginal discharge.  The left ear was irrigated with warm water to remove the cerumen.  Wet prep showed clue cells and white blood cells.  Patient was given a prescription for Flagyl 500 mg 3 times daily  for 7 days.  Explained to her that the chlamydia and gonorrhea test to be back tomorrow.  Patient states she understands.  She will follow-up with [facility] if needed.  Or her regular doctor.  She is to buy over-the-counter Debrox for cerumen buildup.  She was instructed not to drink with the Flagyl.  Patient states she understands and will comply with our discharge instructions.  She was discharged in stable condition     As part of my medical decision making, I reviewed the following data within the electronic MEDICAL RECORD NUMBER Nursing notes reviewed and incorporated, Labs reviewed wet prep positive clue cells, Notes from prior ED visits and [place] Controlled Substance Database  ____________________________________________   FINAL CLINICAL IMPRESSION(S) / ED DIAGNOSES  Final diagnoses:  Bacterial vaginitis  Impacted cerumen of left ear      NEW MEDICATIONS STARTED DURING THIS VISIT:  Discharge Medication List as of 07/26/2017  2:46 PM    START taking these medications   Details  metroNIDAZOLE (FLAGYL) 500 MG tablet Take 1 tablet (500 mg total) by mouth 3 (three) times daily., Starting  Thu 07/26/2017, Print         Note:  This document was prepared using Dragon voice recognition software and may include unintentional dictation errors.    Faythe GheeFisher, Myron Stankovich W, PA-C 07/26/17 1508    Don PerkingVeronese, [place], MD 07/26/17 [phone]

## 2017-07-30 ENCOUNTER — Emergency Department
Admission: EM | Admit: 2017-07-30 | Discharge: 2017-07-30 | Disposition: A | Discharge status: Home / Self Care | Payer: Self-pay | Attending: Emergency Medicine | Admitting: Emergency Medicine

## 2017-07-30 ENCOUNTER — Other Ambulatory Visit: Payer: Self-pay

## 2017-07-30 DIAGNOSIS — Z79899 Other long term (current) drug therapy: Secondary | ICD-10-CM | POA: Insufficient documentation

## 2017-07-30 DIAGNOSIS — F17200 Nicotine dependence, unspecified, uncomplicated: Secondary | ICD-10-CM | POA: Insufficient documentation

## 2017-07-30 DIAGNOSIS — J111 Influenza due to unidentified influenza virus with other respiratory manifestations: Secondary | ICD-10-CM | POA: Insufficient documentation

## 2017-07-30 DIAGNOSIS — R030 Elevated blood-pressure reading, without diagnosis of hypertension: Secondary | ICD-10-CM | POA: Insufficient documentation

## 2017-07-30 DIAGNOSIS — R110 Nausea: Secondary | ICD-10-CM | POA: Insufficient documentation

## 2017-07-30 DIAGNOSIS — R69 Illness, unspecified: Secondary | ICD-10-CM

## 2017-07-30 DIAGNOSIS — M791 Myalgia, unspecified site: Secondary | ICD-10-CM | POA: Insufficient documentation

## 2017-07-30 LAB — INFLUENZA PANEL BY PCR (TYPE A & B)
INFLAPCR: NEGATIVE
INFLBPCR: NEGATIVE

## 2017-07-30 MED ORDER — ONDANSETRON 4 MG PO TBDP
4.0000 mg | ORAL_TABLET | Freq: Once | ORAL | Status: AC
  Administered 2017-07-30: 4 mg via ORAL
  Filled 2017-07-30: qty 1

## 2017-07-30 MED ORDER — ONDANSETRON 4 MG PO TBDP: 4.0000 mg | ORAL_TABLET | Freq: Three times a day (TID) | ORAL | 0 refills | Status: DC | PRN

## 2017-07-30 MED ORDER — IBUPROFEN 800 MG PO TABS
800.0000 mg | ORAL_TABLET | Freq: Once | ORAL | Status: AC
  Administered 2017-07-30: 800 mg via ORAL
  Filled 2017-07-30: qty 1

## 2017-07-30 MED ORDER — OSELTAMIVIR PHOSPHATE 75 MG PO CAPS: 75.0000 mg | ORAL_CAPSULE | Freq: Two times a day (BID) | ORAL | 0 refills | Status: DC

## 2017-07-30 MED ORDER — ACETAMINOPHEN 325 MG PO TABS
650.0000 mg | ORAL_TABLET | Freq: Once | ORAL | Status: AC | PRN
  Administered 2017-07-30: 650 mg via ORAL
  Filled 2017-07-30: qty 2

## 2017-07-30 NOTE — Discharge Instructions (Signed)
Follow-up with your regular doctor if not better in 3-5 days.  Use medication as prescribed.  Take Tylenol and ibuprofen as needed for pain and fever.  Drink plenty of fluids this will help with the achiness.  Return to the emergency department if you are worsening

## 2017-07-30 NOTE — ED Provider Notes (Signed)
[facility] Emergency Department Provider Note  ____________________________________________   None    (approximate)  I have reviewed the triage vital signs and the nursing notes.   HISTORY  Chief Complaint URI    HPI Latoya Benson is a 22 y.o. female presents emergency department complaining of fever, chills, body aches today while at work.  Fever started suddenly.  She states she feels like she has the flu.  She had some nausea but no vomiting.  She denies any chest pain or shortness of breath  Past Medical History:  Diagnosis Date  . Medical history non-contributory     Patient Active Problem List   Diagnosis Date Noted  . Sepsis (HCC) 04/02/2016    Past Surgical History:  Procedure Laterality Date  . CHOLECYSTECTOMY    . COLON SURGERY    . 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 Authorizing Provider  acetaminophen (TYLENOL) 500 MG tablet Take 2,000 mg by mouth every 6 (six) hours as needed.    [provider]  famotidine (PEPCID) 20 MG tablet Take 1 tablet (20 mg total) by mouth 2 (two) times daily. 06/30/17   Irean Hong, MD  meloxicam (MOBIC) 15 MG tablet Take 1 tablet (15 mg total) by mouth daily. 07/11/16   Cuthriell, Delorise Royals, PA-C  meloxicam (MOBIC) 15 MG tablet Take 1 tablet (15 mg total) by mouth daily. 01/27/17 01/27/18  Phineas Semen, MD  metroNIDAZOLE (FLAGYL) 500 MG tablet Take 1 tablet (500 mg total) by mouth 3 (three) times daily. 07/26/17   Marina Desire, Roselyn Bering, PA-C  ondansetron (ZOFRAN-ODT) 4 MG disintegrating tablet Take 1 tablet (4 mg total) by mouth every 8 (eight) hours as needed for nausea or vomiting. 07/30/17   Sherrie Mustache Roselyn Bering, PA-C  oseltamivir (TAMIFLU) 75 MG capsule Take 1 capsule (75 mg total) by mouth 2 (two) times daily. 07/30/17   Sherrie Mustache Roselyn Bering, PA-C  penicillin v potassium (VEETID) 500 MG tablet Take 1 tablet (500 mg total) by mouth  3 (three) times daily. 07/02/16   Mackuen, Courteney Lyn, MD  sucralfate (CARAFATE) 1 GM/10ML suspension Take 10 mLs (1 g total) by mouth 4 (four) times daily. 06/30/17   Irean Hong, MD  traMADol (ULTRAM) 50 MG tablet Take 1 tablet (50 mg total) by mouth every 6 (six) hours as needed. 07/02/16   Mackuen, Cindee Salt, MD    Allergies Patient has no known allergies.  Family History  Problem Relation Age of Onset  . Diabetes Neg Hx     Social History Social History   Tobacco Use  . Smoking status: Current Some Day Smoker  . Smokeless tobacco: Never Used  Substance Use Topics  . Alcohol use: Yes  . Drug use: No    Review of Systems  Constitutional: Positive fever/chills Eyes: No visual changes. ENT: No sore throat. Respiratory: Positive cough Genitourinary: Negative for dysuria. Musculoskeletal: Negative for back pain. Skin: Negative for rash.    ____________________________________________   PHYSICAL EXAM:  VITAL SIGNS: ED Triage Vitals [07/30/17 1759]  Enc Vitals Group     BP (!) 162/100     Pulse Rate (!) 120     Resp 18     Temp (!) 100.7 F (38.2 C)     Temp Source Oral     SpO2 100 %     Weight 190 lb (86.2 kg)     Height  5\' 2"  (1.575 m)     Head Circumference      Peak Flow      Pain Score 10     Pain Loc      Pain Edu?      Excl. in GC?     Constitutional: Alert and oriented. Well appearing and in no acute distress. Eyes: Conjunctivae are normal.  Head: Atraumatic. Nose: No congestion/rhinnorhea. Mouth/Throat: Mucous membranes are moist.  Throat is normal Cardiovascular: Normal rate, regular rhythm.  Heart sounds are normal Respiratory: Normal respiratory effort.  No retractions, lungs clear to auscultation GU: deferred Musculoskeletal: FROM all extremities, warm and well perfused Neurologic:  Normal speech and language.  Skin:  Skin is warm, dry and intact. No rash noted. Psychiatric: Mood and affect are normal. Speech and behavior are  normal.  ____________________________________________   LABS (all labs ordered are listed, but only abnormal results are displayed)  Labs Reviewed  INFLUENZA PANEL BY PCR (TYPE A & B)   ____________________________________________   ____________________________________________  RADIOLOGY    ____________________________________________   PROCEDURES  Procedure(s) performed: No  Procedures    ____________________________________________   INITIAL IMPRESSION / ASSESSMENT AND PLAN / ED COURSE  Pertinent labs & imaging results that were available during my care of the patient were reviewed by me and considered in my medical decision making (see chart for details).  Patient is 22 year old female presents emergency department complaining of fevers, chills and body aches today.  On physical exam patient is febrile.  She appears to be flulike.  Remainder the exam is benign  Flu test is negative  Flu test results were discussed with the patient.  Explained to her that she still acts like she has flulike symptoms could prescribe Tamiflu for her.  Explained to her the Tamiflu does not cure the flu but it may help with symptoms.  Also told the patient how much the medication cost.  She was given a prescription for Zofran ODT to help with the nausea.  If she is worsening she should return to the emergency department.  She should not return to work until she has not had a fever for 24 hours.  Patient states she understands will comply with recommendations.  She is to drink plenty of fluids.  Patient was discharged in stable condition      As part of my medical decision making, I reviewed the following data within the electronic MEDICAL RECORD NUMBER Nursing notes reviewed and incorporated, Labs reviewed flu test is negative, Notes from prior ED visits and [place] Controlled Substance Database  ____________________________________________   FINAL CLINICAL IMPRESSION(S) / ED  DIAGNOSES  Final diagnoses:  Influenza-like illness      NEW MEDICATIONS STARTED DURING THIS VISIT:  Discharge Medication List as of 07/30/2017  7:41 PM    START taking these medications   Details  oseltamivir (TAMIFLU) 75 MG capsule Take 1 capsule (75 mg total) by mouth 2 (two) times daily., Starting Mon 07/30/2017, Print         Note:  This document was prepared using Dragon voice recognition software and may include unintentional dictation errors.    Faythe GheeFisher, Crysta Gulick W, PA-C 07/30/17 2100    Minna AntisPaduchowski, Kevin, MD 07/30/17 2312

## 2017-07-30 NOTE — ED Notes (Signed)
See triage note  States she developed chills and body aches today while at work  Subjective fever at that time  On arrival febrile

## 2017-07-30 NOTE — ED Triage Notes (Signed)
Pt c/o bodyaches with congestion and fever that started today. Pt states she attempted to donate plasma and they turned her away due to fever and HR..Marland Kitchen

## 2017-07-31 ENCOUNTER — Encounter: Payer: Self-pay | Admitting: Emergency Medicine

## 2017-07-31 ENCOUNTER — Inpatient Hospital Stay
Admission: EM | Admit: 2017-07-31 | Discharge: 2017-08-04 | DRG: 872 | Disposition: A | Discharge status: Home / Self Care | Payer: Self-pay | Attending: Internal Medicine | Admitting: Internal Medicine

## 2017-07-31 DIAGNOSIS — N12 Tubulo-interstitial nephritis, not specified as acute or chronic: Secondary | ICD-10-CM | POA: Diagnosis present

## 2017-07-31 DIAGNOSIS — N179 Acute kidney failure, unspecified: Secondary | ICD-10-CM | POA: Diagnosis present

## 2017-07-31 DIAGNOSIS — F17200 Nicotine dependence, unspecified, uncomplicated: Secondary | ICD-10-CM | POA: Diagnosis present

## 2017-07-31 DIAGNOSIS — A419 Sepsis, unspecified organism: Principal | ICD-10-CM | POA: Diagnosis present

## 2017-07-31 DIAGNOSIS — R51 Headache: Secondary | ICD-10-CM | POA: Diagnosis not present

## 2017-07-31 DIAGNOSIS — B9689 Other specified bacterial agents as the cause of diseases classified elsewhere: Secondary | ICD-10-CM | POA: Diagnosis present

## 2017-07-31 DIAGNOSIS — N10 Acute tubulo-interstitial nephritis: Secondary | ICD-10-CM | POA: Diagnosis present

## 2017-07-31 DIAGNOSIS — B9620 Unspecified Escherichia coli [E. coli] as the cause of diseases classified elsewhere: Secondary | ICD-10-CM | POA: Diagnosis present

## 2017-07-31 DIAGNOSIS — Z79899 Other long term (current) drug therapy: Secondary | ICD-10-CM

## 2017-07-31 DIAGNOSIS — N760 Acute vaginitis: Secondary | ICD-10-CM | POA: Diagnosis present

## 2017-07-31 LAB — URINALYSIS, ROUTINE W REFLEX MICROSCOPIC
BILIRUBIN URINE: NEGATIVE
Glucose, UA: NEGATIVE mg/dL
KETONES UR: NEGATIVE mg/dL
NITRITE: NEGATIVE
Protein, ur: 30 mg/dL — AB
SPECIFIC GRAVITY, URINE: 1.015 (ref 1.005–1.030)
pH: 5 (ref 5.0–8.0)

## 2017-07-31 LAB — CBC
HEMATOCRIT: 36.8 % (ref 35.0–47.0)
Hemoglobin: 12.6 g/dL (ref 12.0–16.0)
MCH: 30.6 pg (ref 26.0–34.0)
MCHC: 34.2 g/dL (ref 32.0–36.0)
MCV: 89.5 fL (ref 80.0–100.0)
Platelets: 413 10*3/uL (ref 150–440)
RBC: 4.12 MIL/uL (ref 3.80–5.20)
RDW: 13.6 % (ref 11.5–14.5)
WBC: 19.7 10*3/uL — ABNORMAL HIGH (ref 3.6–11.0)

## 2017-07-31 LAB — COMPREHENSIVE METABOLIC PANEL
ALBUMIN: 3.6 g/dL (ref 3.5–5.0)
ALT: 14 U/L (ref 14–54)
ANION GAP: 13 (ref 5–15)
AST: 15 U/L (ref 15–41)
Alkaline Phosphatase: 50 U/L (ref 38–126)
BUN: 13 mg/dL (ref 6–20)
CHLORIDE: 106 mmol/L (ref 101–111)
CO2: 16 mmol/L — ABNORMAL LOW (ref 22–32)
Calcium: 8.2 mg/dL — ABNORMAL LOW (ref 8.9–10.3)
Creatinine, Ser: 1.07 mg/dL — ABNORMAL HIGH (ref 0.44–1.00)
GFR calc Af Amer: >60 mL/min (ref >60)
GFR calc non Af Amer: >60 mL/min (ref >60)
GLUCOSE: 98 mg/dL (ref 65–99)
POTASSIUM: 3.6 mmol/L (ref 3.5–5.1)
SODIUM: 135 mmol/L (ref 135–145)
Total Bilirubin: 1.1 mg/dL (ref 0.3–1.2)
Total Protein: 7.3 g/dL (ref 6.5–8.1)

## 2017-07-31 LAB — POCT PREGNANCY, URINE: PREG TEST UR: NEGATIVE

## 2017-07-31 LAB — LACTIC ACID, PLASMA: Lactic Acid, Venous: 1.1 mmol/L (ref 0.5–1.9)

## 2017-07-31 MED ORDER — KETOROLAC TROMETHAMINE 30 MG/ML IJ SOLN
15.0000 mg | Freq: Once | INTRAMUSCULAR | Status: AC
  Administered 2017-07-31: 15 mg via INTRAVENOUS

## 2017-07-31 MED ORDER — SODIUM CHLORIDE 0.9 % IV SOLN
1.0000 g | Freq: Once | INTRAVENOUS | Status: AC
  Administered 2017-07-31: 1 g via INTRAVENOUS

## 2017-07-31 MED ORDER — ONDANSETRON HCL 4 MG/2ML IJ SOLN
4.0000 mg | Freq: Once | INTRAMUSCULAR | Status: AC
  Administered 2017-07-31: 4 mg via INTRAVENOUS

## 2017-07-31 MED ORDER — SODIUM CHLORIDE 0.9 % IV BOLUS (SEPSIS)
1000.0000 mL | Freq: Once | INTRAVENOUS | Status: AC
  Administered 2017-07-31: 1000 mL via INTRAVENOUS

## 2017-07-31 MED ORDER — SODIUM CHLORIDE 0.9 % IV SOLN
INTRAVENOUS | Status: AC
  Administered 2017-07-31: 1 g via INTRAVENOUS
  Filled 2017-07-31: qty 10

## 2017-07-31 MED ORDER — ONDANSETRON HCL 4 MG/2ML IJ SOLN
INTRAMUSCULAR | Status: AC
  Administered 2017-07-31: 4 mg via INTRAVENOUS
  Filled 2017-07-31: qty 2

## 2017-07-31 MED ORDER — KETOROLAC TROMETHAMINE 30 MG/ML IJ SOLN
INTRAMUSCULAR | Status: AC
  Administered 2017-07-31: 15 mg via INTRAVENOUS
  Filled 2017-07-31: qty 1

## 2017-07-31 NOTE — ED Triage Notes (Signed)
Pt with fever and chills, was seen her yesterday for same.

## 2017-07-31 NOTE — H&P (Signed)
[facility] [place] [facility]   PATIENT NAME: Latoya Benson    MR#:  [id number]  DATE OF BIRTH:  1996-05-17  DATE OF ADMISSION:  07/31/2017  PRIMARY CARE PHYSICIAN: [facility]   REQUESTING/REFERRING PHYSICIAN:   CHIEF COMPLAINT:   Chief Complaint  Patient presents with  . Generalized Body Aches    HISTORY OF PRESENT ILLNESS: Latoya Benson  is a 22 y.o. female with a known history of recurrent urinary tract infections. Patient presented to emergency room for severe generalized weakness and body aches, nausea, vomiting and fever up to 102, going on for the past 2-3 days, gradually getting worse.  She denies any chest pain, shortness of breath, cough or diarrhea.  She was seen yesterday for the same symptoms, thought to have a viral syndrome; flu test was negative. Patient has been on Flagyl 3 times a day for bacterial vaginosis, prescribed 6 days ago. Upon evaluation in the emergency room, she is noted with fever and tachycardia.  She has bilateral flank tenderness, left worse than the right side.  UA is positive for UTI.  Creatinine level is slightly elevated at 1.07 and WBC is elevated at 19.7. She is admitted for further evaluation and treatment  PAST MEDICAL HISTORY:   Past Medical History:  Diagnosis Date  . Medical history non-contributory     PAST SURGICAL HISTORY:  Past Surgical History:  Procedure Laterality Date  . CHOLECYSTECTOMY    . COLON SURGERY    . intestinal surgery at birth      SOCIAL HISTORY:  Social History   Tobacco Use  . Smoking status: Current Some Day Smoker  . Smokeless tobacco: Never Used  Substance Use Topics  . Alcohol use: Yes    FAMILY HISTORY:  Family History  Problem Relation Age of Onset  . Diabetes Neg Hx     DRUG ALLERGIES: No Known Allergies  REVIEW OF SYSTEMS:   CONSTITUTIONAL: Positive for fever, fatigue and generalized weakness.  EYES: No blurred or double  vision.  EARS, NOSE, AND THROAT: No tinnitus or ear pain.  RESPIRATORY: No cough, shortness of breath, wheezing or hemoptysis.  CARDIOVASCULAR: No chest pain, orthopnea, edema.  GASTROINTESTINAL: Positive for nausea and vomiting; no diarrhea or abdominal pain.  GENITOURINARY: No dysuria, hematuria.  ENDOCRINE: No polyuria, nocturia,  HEMATOLOGY: No bleeding. SKIN: No rash or lesion. MUSCULOSKELETAL: No joint pain.   NEUROLOGIC: No focal weakness.  PSYCHIATRY: No anxiety or depression.   MEDICATIONS AT HOME:  Prior to Admission medications   Medication Sig Start Date End Date Taking? Authorizing Provider  acetaminophen (TYLENOL) 500 MG tablet Take 2,000 mg by mouth every 6 (six) hours as needed.   Yes [provider]  metroNIDAZOLE (FLAGYL) 500 MG tablet Take 1 tablet (500 mg total) by mouth 3 (three) times daily. 07/26/17  Yes Fisher, Roselyn Bering, PA-C  ondansetron (ZOFRAN-ODT) 4 MG disintegrating tablet Take 1 tablet (4 mg total) by mouth every 8 (eight) hours as needed for nausea or vomiting. 07/30/17  Yes Fisher, Roselyn Bering, PA-C  oseltamivir (TAMIFLU) 75 MG capsule Take 1 capsule (75 mg total) by mouth 2 (two) times daily. 07/30/17  Yes Fisher, Roselyn Bering, PA-C  famotidine (PEPCID) 20 MG tablet Take 1 tablet (20 mg total) by mouth 2 (two) times daily. Patient not taking: Reported on 07/31/2017 06/30/17   Irean Hong, MD  meloxicam (MOBIC) 15 MG tablet Take 1 tablet (15 mg total) by mouth daily. Patient not  taking: Reported on 07/31/2017 07/11/16   Cuthriell, Delorise Royals, PA-C  meloxicam (MOBIC) 15 MG tablet Take 1 tablet (15 mg total) by mouth daily. Patient not taking: Reported on 07/31/2017 01/27/17 01/27/18  Phineas Semen, MD  penicillin v potassium (VEETID) 500 MG tablet Take 1 tablet (500 mg total) by mouth 3 (three) times daily. Patient not taking: Reported on 07/31/2017 07/02/16   Mackuen, Courteney Lyn, MD  sucralfate (CARAFATE) 1 GM/10ML suspension Take 10 mLs (1 g total) by mouth 4  (four) times daily. Patient not taking: Reported on 07/31/2017 06/30/17   Irean Hong, MD  traMADol (ULTRAM) 50 MG tablet Take 1 tablet (50 mg total) by mouth every 6 (six) hours as needed. Patient not taking: Reported on 07/31/2017 07/02/16   Mackuen, Courteney Lyn, MD      PHYSICAL EXAMINATION:   VITAL SIGNS: Blood pressure (!) 121/57, pulse (!) 118, temperature 98.4 F (36.9 C), temperature source Oral, resp. rate 18, height 5\' 2"  (1.575 m), weight 86.2 kg (190 lb), last menstrual period 05/22/2017, SpO2 100 %.  GENERAL:  22 y.o.-year-old patient lying in the bed, in moderate distress, secondary to pain and fever.  EYES: Pupils equal, round, reactive to light and accommodation. No scleral icterus. Extraocular muscles intact.  HEENT: Head atraumatic, normocephalic. Oropharynx and nasopharynx clear.  NECK:  Supple, no jugular venous distention. No thyroid enlargement, no tenderness.  LUNGS: Normal breath sounds bilaterally, no wheezing, rales,rhonchi or crepitation. No use of accessory muscles of respiration.  CARDIOVASCULAR: S1, S2 normal. No murmurs, rubs, or gallops.  ABDOMEN: Soft, nondistended;  bowel sounds present. No organomegaly or mass.  There is bilateral CVA tenderness, left side greater than the right side.  EXTREMITIES: No pedal edema, cyanosis, or clubbing.  NEUROLOGIC: No focal weakness. Gait not checked.  PSYCHIATRIC: The patient is alert and oriented x 3.  SKIN: No obvious rash, lesion, or ulcer.   LABORATORY PANEL:   CBC Recent Labs  Lab 07/31/17 2143  WBC 19.7*  HGB 12.6  HCT 36.8  PLT 413  MCV 89.5  MCH 30.6  MCHC 34.2  RDW 13.6   ------------------------------------------------------------------------------------------------------------------  Chemistries  Recent Labs  Lab 07/31/17 2246  NA 135  K 3.6  CL 106  CO2 16*  GLUCOSE 98  BUN 13  CREATININE 1.07*  CALCIUM 8.2*  AST 15  ALT 14  ALKPHOS 50  BILITOT 1.1    ------------------------------------------------------------------------------------------------------------------ estimated creatinine clearance is 83.3 mL/min (A) (by C-G formula based on SCr of 1.07 mg/dL (H)). ------------------------------------------------------------------------------------------------------------------ No results for input(s): TSH, T4TOTAL, T3FREE, THYROIDAB in the last 72 hours.  Invalid input(s): FREET3   Coagulation profile No results for input(s): INR, PROTIME in the last 168 hours. ------------------------------------------------------------------------------------------------------------------- No results for input(s): DDIMER in the last 72 hours. -------------------------------------------------------------------------------------------------------------------  Cardiac Enzymes No results for input(s): CKMB, TROPONINI, MYOGLOBIN in the last 168 hours.  Invalid input(s): CK ------------------------------------------------------------------------------------------------------------------ Invalid input(s): POCBNP  ---------------------------------------------------------------------------------------------------------------  Urinalysis    Component Value Date/Time   COLORURINE YELLOW (A) 07/31/2017 1848   APPEARANCEUR CLOUDY (A) 07/31/2017 1848   APPEARANCEUR Hazy 05/16/2012 0858   LABSPEC 1.015 07/31/2017 1848   LABSPEC 1.011 05/16/2012 0858   PHURINE 5.0 07/31/2017 1848   GLUCOSEU NEGATIVE 07/31/2017 1848   GLUCOSEU Negative 05/16/2012 0858   HGBUR MODERATE (A) 07/31/2017 1848   BILIRUBINUR NEGATIVE 07/31/2017 1848   BILIRUBINUR Negative 05/16/2012 0858   KETONESUR NEGATIVE 07/31/2017 1848   PROTEINUR 30 (A) 07/31/2017 1848   NITRITE NEGATIVE 07/31/2017 1848   LEUKOCYTESUR  MODERATE (A) 07/31/2017 1848   LEUKOCYTESUR Trace 05/16/2012 0858     RADIOLOGY: No results found.  EKG: Orders placed or performed during the hospital encounter of  06/30/17  . EKG 12-Lead  . EKG 12-Lead  . EKG 12-Lead  . EKG 12-Lead  . EKG    IMPRESSION AND PLAN:  1.  SIRS, secondary to pyelonephritis.  We will start IV ceftriaxone and fluid resuscitation, while waiting for urine and blood culture final results.  Continue supportive measures. 2.  Acute, bilateral pyelonephritis.  Start ceftriaxone IV and fluid resuscitation, while waiting for urine and blood cultures final results. 3.  Acute renal failure, likely prerenal.  Creatinine is mildly elevated to 1.07.  We will start IV fluids and monitor kidney function closely.  Avoid nephrotoxic medications. 4.  Bacterial vaginosis, improved, will finish Flagyl course.  All the records are reviewed and case discussed with ED provider. Management plans discussed with the patient, family and they are in agreement.  CODE STATUS: Code Status History    Date Active Date Inactive Code Status Order ID Comments User Context   04/02/2016 10:24 04/05/2016 16:19 Full Code [id number]  Hower, Cletis Athensavid K, MD ED       TOTAL TIME TAKING CARE OF THIS PATIENT: 35 minutes.    Cammy CopaAngela Depaul Arizpe M.D on 07/31/2017 at 11:40 PM  Between 7am to 6pm - Pager - [phone]  After 6pm go to [email or web address] - password [email or web address] [facility]  [facility] [place] [facility]  Office  [phone]  CC: Primary care physician; [facility]

## 2017-07-31 NOTE — ED Notes (Signed)
Pt ambulatory to toilet by self.  

## 2017-07-31 NOTE — ED Provider Notes (Signed)
[facility] Emergency Department Provider Note  Time seen: 9:42 PM  I have reviewed the triage vital signs and the nursing notes.   HISTORY  Chief Complaint Generalized Body Aches    HPI Latoya Benson is a 22 y.o. female with no significant past medical history presents to the emergency department with concerns of fever nausea vomiting generalized body aches.  According to the patient since yesterday she has had a fever to 102 intermittently, taking Aleve throughout the day for fever.  States she has had diffuse body aches in her arms legs chest abdomen and back since yesterday as well.  Denies any cough states mild congestion mild sore throat.  Denies diarrhea.  States she has been nauseated all day today and had one episode of vomiting.  Patient was seen yesterday for the same and states she had a negative flu test.  Past Medical History:  Diagnosis Date  . Medical history non-contributory     Patient Active Problem List   Diagnosis Date Noted  . Sepsis (HCC) 04/02/2016    Past Surgical History:  Procedure Laterality Date  . CHOLECYSTECTOMY    . COLON SURGERY    . 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 Authorizing Provider  acetaminophen (TYLENOL) 500 MG tablet Take 2,000 mg by mouth every 6 (six) hours as needed.    [provider]  famotidine (PEPCID) 20 MG tablet Take 1 tablet (20 mg total) by mouth 2 (two) times daily. 06/30/17   Irean HongSung, Jade J, MD  meloxicam (MOBIC) 15 MG tablet Take 1 tablet (15 mg total) by mouth daily. 07/11/16   Cuthriell, Delorise RoyalsJonathan D, PA-C  meloxicam (MOBIC) 15 MG tablet Take 1 tablet (15 mg total) by mouth daily. 01/27/17 01/27/18  Phineas SemenGoodman, Graydon, MD  metroNIDAZOLE (FLAGYL) 500 MG tablet Take 1 tablet (500 mg total) by mouth 3 (three) times daily. 07/26/17   Fisher, Roselyn BeringSusan W, PA-C  ondansetron (ZOFRAN-ODT) 4 MG disintegrating tablet Take 1 tablet (4  mg total) by mouth every 8 (eight) hours as needed for nausea or vomiting. 07/30/17   Sherrie MustacheFisher, Roselyn BeringSusan W, PA-C  oseltamivir (TAMIFLU) 75 MG capsule Take 1 capsule (75 mg total) by mouth 2 (two) times daily. 07/30/17   Sherrie MustacheFisher, Roselyn BeringSusan W, PA-C  penicillin v potassium (VEETID) 500 MG tablet Take 1 tablet (500 mg total) by mouth 3 (three) times daily. 07/02/16   Mackuen, Courteney Lyn, MD  sucralfate (CARAFATE) 1 GM/10ML suspension Take 10 mLs (1 g total) by mouth 4 (four) times daily. 06/30/17   Irean HongSung, Jade J, MD  traMADol (ULTRAM) 50 MG tablet Take 1 tablet (50 mg total) by mouth every 6 (six) hours as needed. 07/02/16   Mackuen, Courteney Lyn, MD    No Known Allergies  Family History  Problem Relation Age of Onset  . Diabetes Neg Hx     Social History Social History   Tobacco Use  . Smoking status: Current Some Day Smoker  . Smokeless tobacco: Never Used  Substance Use Topics  . Alcohol use: Yes  . Drug use: No    Review of Systems Constitutional: Positive for fever times 2 days Eyes: Negative for visual complaints ENT: Mild congestion Cardiovascular: Negative for chest pain. Respiratory: Negative for shortness of breath. Gastrointestinal: Negative for abdominal pain.  Positive for nausea and vomiting.  Negative for diarrhea Genitourinary: Negative for dysuria Musculoskeletal: Generalized body aches Skin: Negative for  skin complaints  Neurological: Negative for headache All other ROS negative  ____________________________________________   PHYSICAL EXAM:  VITAL SIGNS: ED Triage Vitals  Enc Vitals Group     BP 07/31/17 1836 114/81     Pulse Rate 07/31/17 1836 (!) 138     Resp 07/31/17 1836 (!) 26     Temp 07/31/17 1836 (!) 101.9 F (38.8 C)     Temp Source 07/31/17 1836 Oral     SpO2 07/31/17 1836 98 %     Weight 07/31/17 1837 190 lb (86.2 kg)     Height 07/31/17 1837 5\' 2"  (1.575 m)     Head Circumference --      Peak Flow --      Pain Score --      Pain Loc --       Pain Edu? --      Excl. in GC? --    Constitutional: Alert and oriented. Well appearing and in no distress. Eyes: Normal exam ENT   Head: Normocephalic and atraumatic.   Mouth/Throat: Mucous membranes are moist.  Normal-appearing oropharynx without erythema exudate or hypertrophy. Cardiovascular: Normal rate, regular rhythm. No murmur Respiratory: Normal respiratory effort without tachypnea nor retractions. Breath sounds are clear  Gastrointestinal: Soft and nontender. No distention.  Musculoskeletal: Mild diffuse tenderness in arms and legs.  No erythema or edema. Neurologic:  Normal speech and language. No gross focal neurologic deficits  Skin:  Skin is warm, dry and intact.  Psychiatric: Mood and affect are normal.    INITIAL IMPRESSION / ASSESSMENT AND PLAN / ED COURSE  Pertinent labs & imaging results that were available during my care of the patient were reviewed by me and considered in my medical decision making (see chart for details).  Patient presents to the emergency department for fatigue, fever, body aches nausea vomiting for the past 2 days.  Overall the patient appears well, no acute distress.  Mild tachycardia but currently afebrile with a had a fever upon arrival.  Patient has clear lung sounds, largely nontender abdomen but has diffuse muscle pain and tenderness.  Patient was seen in the emergency department yesterday with a negative influenza test.  Differential this time would include other viral illness, upper respiratory illness, gastroenteritis, urinary tract infection, left lateral metabolic abnormality due to infectious etiology.  We will check labs, IV hydrate, treat with Zofran and Toradol and continue to closely monitor in the emergency department.  This is labs show significant leukocytosis of 19,000, too numerous to count red cells white cells and many bacteria.  On repeat exam patient has moderate CVA tenderness to palpation.  Given the patient's fever  tachycardia we will check blood cultures, lactic acid start on IV Rocephin for likely pyelonephritis.  Patient states she has had a kidney infection before but this 1 is much worse.  Anticipate likely admission to the hospital for pyelonephritis treatment.  ____________________________________________   FINAL CLINICAL IMPRESSION(S) / ED DIAGNOSES  Nausea vomiting Fever Weakness Pyelonephritis   Minna Antis, MD 07/31/17 2251

## 2017-08-01 ENCOUNTER — Other Ambulatory Visit: Payer: Self-pay

## 2017-08-01 LAB — BASIC METABOLIC PANEL
Anion gap: 9 (ref 5–15)
BUN: 12 mg/dL (ref 6–20)
CALCIUM: 7.8 mg/dL — AB (ref 8.9–10.3)
CO2: 21 mmol/L — ABNORMAL LOW (ref 22–32)
CREATININE: 1.2 mg/dL — AB (ref 0.44–1.00)
Chloride: 105 mmol/L (ref 101–111)
GFR calc Af Amer: >60 mL/min (ref >60)
GLUCOSE: 101 mg/dL — AB (ref 65–99)
Potassium: 3.5 mmol/L (ref 3.5–5.1)
Sodium: 135 mmol/L (ref 135–145)

## 2017-08-01 LAB — CBC
HCT: 32.4 % — ABNORMAL LOW (ref 35.0–47.0)
Hemoglobin: 10.8 g/dL — ABNORMAL LOW (ref 12.0–16.0)
MCH: 30 pg (ref 26.0–34.0)
MCHC: 33.4 g/dL (ref 32.0–36.0)
MCV: 90 fL (ref 80.0–100.0)
Platelets: 255 10*3/uL (ref 150–440)
RBC: 3.6 MIL/uL — ABNORMAL LOW (ref 3.80–5.20)
RDW: 13.4 % (ref 11.5–14.5)
WBC: 11.8 10*3/uL — ABNORMAL HIGH (ref 3.6–11.0)

## 2017-08-01 LAB — GLUCOSE, CAPILLARY: GLUCOSE-CAPILLARY: 97 mg/dL (ref 65–99)

## 2017-08-01 MED ORDER — SODIUM CHLORIDE 0.9 % IV SOLN
2.0000 g | INTRAVENOUS | Status: DC
  Administered 2017-08-01 – 2017-08-02 (×2): 2 g via INTRAVENOUS
  Filled 2017-08-01 (×3): qty 20

## 2017-08-01 MED ORDER — ONDANSETRON HCL 4 MG PO TABS: 4.0000 mg | ORAL_TABLET | Freq: Four times a day (QID) | ORAL | Status: DC | PRN

## 2017-08-01 MED ORDER — ONDANSETRON HCL 4 MG/2ML IJ SOLN
4.0000 mg | Freq: Four times a day (QID) | INTRAMUSCULAR | Status: DC | PRN
  Administered 2017-08-01 – 2017-08-04 (×5): 4 mg via INTRAVENOUS
  Filled 2017-08-01 (×6): qty 2

## 2017-08-01 MED ORDER — HYDROCODONE-ACETAMINOPHEN 5-325 MG PO TABS
1.0000 | ORAL_TABLET | ORAL | Status: DC | PRN
  Administered 2017-08-01 (×2): 1 via ORAL
  Filled 2017-08-01 (×2): qty 1

## 2017-08-01 MED ORDER — BISACODYL 5 MG PO TBEC
5.0000 mg | DELAYED_RELEASE_TABLET | Freq: Every day | ORAL | Status: DC | PRN
  Administered 2017-08-01 – 2017-08-03 (×3): 5 mg via ORAL
  Filled 2017-08-01 (×4): qty 1

## 2017-08-01 MED ORDER — HEPARIN SODIUM (PORCINE) 5000 UNIT/ML IJ SOLN
5000.0000 [IU] | Freq: Three times a day (TID) | INTRAMUSCULAR | Status: DC
  Administered 2017-08-01: 06:00:00 5000 [IU] via SUBCUTANEOUS
  Filled 2017-08-01 (×3): qty 1

## 2017-08-01 MED ORDER — ACETAMINOPHEN 325 MG PO TABS
650.0000 mg | ORAL_TABLET | Freq: Four times a day (QID) | ORAL | Status: DC | PRN
  Administered 2017-08-01 – 2017-08-03 (×2): 650 mg via ORAL
  Filled 2017-08-01 (×2): qty 2

## 2017-08-01 MED ORDER — OXYCODONE-ACETAMINOPHEN 5-325 MG PO TABS
1.0000 | ORAL_TABLET | Freq: Three times a day (TID) | ORAL | Status: DC | PRN
  Administered 2017-08-01 – 2017-08-02 (×5): 1 via ORAL
  Filled 2017-08-01 (×6): qty 1

## 2017-08-01 MED ORDER — DOCUSATE SODIUM 100 MG PO CAPS
100.0000 mg | ORAL_CAPSULE | Freq: Two times a day (BID) | ORAL | Status: DC
  Administered 2017-08-01 – 2017-08-04 (×7): 100 mg via ORAL
  Filled 2017-08-01 (×8): qty 1

## 2017-08-01 MED ORDER — METRONIDAZOLE IN NACL 5-0.79 MG/ML-% IV SOLN
500.0000 mg | Freq: Three times a day (TID) | INTRAVENOUS | Status: DC
Start: 2017-08-01 — End: 2017-08-03
  Administered 2017-08-01 – 2017-08-03 (×7): 500 mg via INTRAVENOUS
  Filled 2017-08-01 (×11): qty 100

## 2017-08-01 MED ORDER — TRAZODONE HCL 50 MG PO TABS
25.0000 mg | ORAL_TABLET | Freq: Every evening | ORAL | Status: DC | PRN
  Administered 2017-08-01: 25 mg via ORAL
  Filled 2017-08-01: qty 1

## 2017-08-01 MED ORDER — SODIUM CHLORIDE 0.9 % IV SOLN
INTRAVENOUS | Status: DC
  Administered 2017-08-01 – 2017-08-03 (×6): via INTRAVENOUS

## 2017-08-01 MED ORDER — ACETAMINOPHEN 650 MG RE SUPP: 650.0000 mg | Freq: Four times a day (QID) | RECTAL | Status: DC | PRN

## 2017-08-01 NOTE — Plan of Care (Signed)
  Progressing Education: Knowledge of General Education information will improve 08/01/2017 1521 - Progressing by Burnett KanarisPerez, Laydon Martis N, RN Health Behavior/Discharge Planning: Ability to manage health-related needs will improve 08/01/2017 1521 - Progressing by Burnett KanarisPerez, Miaisabella Bacorn N, RN Clinical Measurements: Ability to maintain clinical measurements within normal limits will improve 08/01/2017 1521 - Progressing by Burnett KanarisPerez, Latiffany Harwick N, RN Will remain free from infection 08/01/2017 1521 - Progressing by Burnett KanarisPerez, Shelli Portilla N, RN Diagnostic test results will improve 08/01/2017 1521 - Progressing by Burnett KanarisPerez, Eudelia Hiltunen N, RN Respiratory complications will improve 08/01/2017 1521 - Progressing by Burnett KanarisPerez, Jadamarie Butson N, RN Cardiovascular complication will be avoided 08/01/2017 1521 - Progressing by Burnett KanarisPerez, Besnik Febus N, RN Activity: Risk for activity intolerance will decrease 08/01/2017 1521 - Progressing by Burnett KanarisPerez, Zachory Mangual N, RN Nutrition: Adequate nutrition will be maintained 08/01/2017 1521 - Progressing by Burnett KanarisPerez, Karan Inclan N, RN Coping: Level of anxiety will decrease 08/01/2017 1521 - Progressing by Burnett KanarisPerez, Keyasha Miah N, RN Elimination: Will not experience complications related to bowel motility 08/01/2017 1521 - Progressing by Burnett KanarisPerez, Vernell Townley N, RN Will not experience complications related to urinary retention 08/01/2017 1521 - Progressing by Burnett KanarisPerez, Sueo Cullen N, RN Pain Managment: General experience of comfort will improve 08/01/2017 1521 - Progressing by Burnett KanarisPerez, Latresa Gasser N, RN Safety: Ability to remain free from injury will improve 08/01/2017 1521 - Progressing by Burnett KanarisPerez, Kanishk Stroebel N, RN Skin Integrity: Risk for impaired skin integrity will decrease 08/01/2017 1521 - Progressing by Burnett KanarisPerez, Ranson Belluomini N, RN

## 2017-08-01 NOTE — ED Notes (Signed)
Admitting MD at bedside.

## 2017-08-01 NOTE — Progress Notes (Signed)
[employer] - [place] at [facility]   PATIENT NAME: Latoya CockayneStephanie Benson    MR#:  [id number]  DATE OF BIRTH:  [birth year]  SUBJECTIVE:  CHIEF COMPLAINT:   Chief Complaint  Patient presents with  . Generalized Body Aches  Patient feeling slightly better, family at the bedside, no events overnight per nursing staff, follow-up on cultures  REVIEW OF SYSTEMS:  CONSTITUTIONAL: No fever, fatigue or weakness.  EYES: No blurred or double vision.  EARS, NOSE, AND THROAT: No tinnitus or ear pain.  RESPIRATORY: No cough, shortness of breath, wheezing or hemoptysis.  CARDIOVASCULAR: No chest pain, orthopnea, edema.  GASTROINTESTINAL: No nausea, vomiting, diarrhea or abdominal pain.  GENITOURINARY: No dysuria, hematuria.  ENDOCRINE: No polyuria, nocturia,  HEMATOLOGY: No anemia, easy bruising or bleeding SKIN: No rash or lesion. MUSCULOSKELETAL: No joint pain or arthritis.   NEUROLOGIC: No tingling, numbness, weakness.  PSYCHIATRY: No anxiety or depression.   ROS  DRUG ALLERGIES:  No Known Allergies  VITALS:  Blood pressure (!) 91/53, pulse (!) 102, temperature 98.8 F (37.1 C), resp. rate 18, height 5\' 2"  (1.575 m), weight 85.7 kg (188 lb 14.4 oz), last menstrual period 05/22/2017, SpO2 100 %.  PHYSICAL EXAMINATION:  GENERAL:  22 y.o.-year-old patient lying in the bed with no acute distress.  EYES: Pupils equal, round, reactive to light and accommodation. No scleral icterus. Extraocular muscles intact.  HEENT: Head atraumatic, normocephalic. Oropharynx and nasopharynx clear.  NECK:  Supple, no jugular venous distention. No thyroid enlargement, no tenderness.  LUNGS: Normal breath sounds bilaterally, no wheezing, rales,rhonchi or crepitation. No use of accessory muscles of respiration.  CARDIOVASCULAR: S1, S2 normal. No murmurs, rubs, or gallops.  ABDOMEN: Soft, nontender, nondistended. Bowel sounds present. No organomegaly or mass.  EXTREMITIES: No pedal edema, cyanosis,  or clubbing.  NEUROLOGIC: Cranial nerves II through XII are intact. Muscle strength 5/5 in all extremities. Sensation intact. Gait not checked.  PSYCHIATRIC: The patient is alert and oriented x 3.  SKIN: No obvious rash, lesion, or ulcer.   Physical Exam LABORATORY PANEL:   CBC Recent Labs  Lab 08/01/17 0632  WBC 11.8*  HGB 10.8*  HCT 32.4*  PLT 255   ------------------------------------------------------------------------------------------------------------------  Chemistries  Recent Labs  Lab 07/31/17 2246 08/01/17 0632  NA 135 135  K 3.6 3.5  CL 106 105  CO2 16* 21*  GLUCOSE 98 101*  BUN 13 12  CREATININE 1.07* 1.20*  CALCIUM 8.2* 7.8*  AST 15  --   ALT 14  --   ALKPHOS 50  --   BILITOT 1.1  --    ------------------------------------------------------------------------------------------------------------------  Cardiac Enzymes No results for input(s): TROPONINI in the last 168 hours. ------------------------------------------------------------------------------------------------------------------  RADIOLOGY:  No results found.  ASSESSMENT AND PLAN:  1 acute bilateral pyelonephritis with associated SIRS Resolving continue empiric Rocephin, IV fluids for rehydration, and follow-up on cultures  2 acute SIRs Secondary to above Plan of care as stated above  3 AKI Resolving IV fluids for rehydration, avoid nephrotoxic agents, BMP in the morning  4 acute bacterial vaginosis Resolving Continue Flagyl course  All the records are reviewed and case discussed with Care Management/Social Workerr. Management plans discussed with the patient, family and they are in agreement.  CODE STATUS: full  TOTAL TIME TAKING CARE OF THIS PATIENT: 45 minutes.     POSSIBLE D/C IN 1-3 DAYS, DEPENDING ON CLINICAL CONDITION.   Evelena AsaMontell D Kyndal Heringer M.D on 08/01/2017   Between 7am to 6pm - Pager - [phone]  After  6pm go to [email or web address] - Social research officer, government  [facility]  [place] [facility]  Office  [phone]  CC: Primary care physician; [facility]  Note: This dictation was prepared with Nurse, children's dictation along with smaller phrase technology. Any transcriptional errors that result from this process are unintentional.

## 2017-08-02 LAB — BASIC METABOLIC PANEL
ANION GAP: 7 (ref 5–15)
BUN: 10 mg/dL (ref 6–20)
CHLORIDE: 110 mmol/L (ref 101–111)
CO2: 22 mmol/L (ref 22–32)
Calcium: 7.8 mg/dL — ABNORMAL LOW (ref 8.9–10.3)
Creatinine, Ser: 1.06 mg/dL — ABNORMAL HIGH (ref 0.44–1.00)
Glucose, Bld: 109 mg/dL — ABNORMAL HIGH (ref 65–99)
POTASSIUM: 3.8 mmol/L (ref 3.5–5.1)
SODIUM: 139 mmol/L (ref 135–145)

## 2017-08-02 LAB — GLUCOSE, CAPILLARY: GLUCOSE-CAPILLARY: 104 mg/dL — AB (ref 65–99)

## 2017-08-02 LAB — HIV ANTIBODY (ROUTINE TESTING W REFLEX): HIV Screen 4th Generation wRfx: NONREACTIVE

## 2017-08-02 MED ORDER — PROMETHAZINE HCL 25 MG/ML IJ SOLN
12.5000 mg | Freq: Four times a day (QID) | INTRAMUSCULAR | Status: DC | PRN
  Administered 2017-08-02 (×2): 12.5 mg via INTRAVENOUS
  Filled 2017-08-02 (×2): qty 1

## 2017-08-02 NOTE — Progress Notes (Signed)
Dr Katheren ShamsSalary made aware that pt with emesis already had zofran, new order for PRN phenergan placed

## 2017-08-02 NOTE — Plan of Care (Signed)
  Progressing Education: Knowledge of General Education information will improve 08/02/2017 1254 - Progressing by Burnett KanarisPerez, Andric Kerce N, RN Health Behavior/Discharge Planning: Ability to manage health-related needs will improve 08/02/2017 1254 - Progressing by Burnett KanarisPerez, Brinson Tozzi N, RN Clinical Measurements: Ability to maintain clinical measurements within normal limits will improve 08/02/2017 1254 - Progressing by Burnett KanarisPerez, Asya Derryberry N, RN Will remain free from infection 08/02/2017 1254 - Progressing by Burnett KanarisPerez, Oree Hislop N, RN Diagnostic test results will improve 08/02/2017 1254 - Progressing by Burnett KanarisPerez, Dmarion Perfect N, RN Respiratory complications will improve 08/02/2017 1254 - Progressing by Burnett KanarisPerez, Asad Keeven N, RN Cardiovascular complication will be avoided 08/02/2017 1254 - Progressing by Burnett KanarisPerez, Shaun Zuccaro N, RN Activity: Risk for activity intolerance will decrease 08/02/2017 1254 - Progressing by Burnett KanarisPerez, Saramarie Stinger N, RN Nutrition: Adequate nutrition will be maintained 08/02/2017 1254 - Progressing by Burnett KanarisPerez, Shivank Pinedo N, RN Coping: Level of anxiety will decrease 08/02/2017 1254 - Progressing by Burnett KanarisPerez, Telesha Deguzman N, RN Elimination: Will not experience complications related to bowel motility 08/02/2017 1254 - Progressing by Burnett KanarisPerez, Kal Chait N, RN Will not experience complications related to urinary retention 08/02/2017 1254 - Progressing by Burnett KanarisPerez, Brynlie Daza N, RN Pain Managment: General experience of comfort will improve 08/02/2017 1254 - Progressing by Burnett KanarisPerez, Dameion Briles N, RN Safety: Ability to remain free from injury will improve 08/02/2017 1254 - Progressing by Burnett KanarisPerez, Matheson Vandehei N, RN Skin Integrity: Risk for impaired skin integrity will decrease 08/02/2017 1254 - Progressing by Burnett KanarisPerez, Adair Lauderback N, RN

## 2017-08-03 LAB — URINE CULTURE: Culture: >=100000 — AB

## 2017-08-03 LAB — GLUCOSE, CAPILLARY: GLUCOSE-CAPILLARY: 83 mg/dL (ref 65–99)

## 2017-08-03 MED ORDER — OXYCODONE-ACETAMINOPHEN 5-325 MG PO TABS
1.0000 | ORAL_TABLET | Freq: Three times a day (TID) | ORAL | Status: DC | PRN
  Administered 2017-08-03 (×3): 1 via ORAL
  Filled 2017-08-03 (×4): qty 1

## 2017-08-03 MED ORDER — SODIUM CHLORIDE 0.9 % IV SOLN
1.0000 g | INTRAVENOUS | Status: DC
  Filled 2017-08-03: qty 10

## 2017-08-03 MED ORDER — CEFAZOLIN SODIUM-DEXTROSE 2-4 GM/100ML-% IV SOLN
2.0000 g | Freq: Three times a day (TID) | INTRAVENOUS | Status: DC
  Administered 2017-08-03 – 2017-08-04 (×3): 2 g via INTRAVENOUS
  Filled 2017-08-03 (×5): qty 100

## 2017-08-03 MED ORDER — METRONIDAZOLE 500 MG PO TABS
500.0000 mg | ORAL_TABLET | Freq: Two times a day (BID) | ORAL | Status: DC
  Administered 2017-08-03 – 2017-08-04 (×3): 500 mg via ORAL
  Filled 2017-08-03 (×3): qty 1

## 2017-08-03 NOTE — Progress Notes (Signed)
[employer] - [place] at [facility]   PATIENT NAME: Latoya Benson    MR#:  [id number]  DATE OF BIRTH:  Oct 28, 1995  SUBJECTIVE:  CHIEF COMPLAINT:   Chief Complaint  Patient presents with  . Generalized Body Aches  Complains of ill feeling, nausea, headache, continued sinus tachycardia noted  REVIEW OF SYSTEMS:  CONSTITUTIONAL: No fever, fatigue or weakness.  EYES: No blurred or double vision.  EARS, NOSE, AND THROAT: No tinnitus or ear pain.  RESPIRATORY: No cough, shortness of breath, wheezing or hemoptysis.  CARDIOVASCULAR: No chest pain, orthopnea, edema.  GASTROINTESTINAL: No nausea, vomiting, diarrhea or abdominal pain.  GENITOURINARY: No dysuria, hematuria.  ENDOCRINE: No polyuria, nocturia,  HEMATOLOGY: No anemia, easy bruising or bleeding SKIN: No rash or lesion. MUSCULOSKELETAL: No joint pain or arthritis.   NEUROLOGIC: No tingling, numbness, weakness.  PSYCHIATRY: No anxiety or depression.   ROS  DRUG ALLERGIES:  No Known Allergies  VITALS:  Blood pressure 122/72, pulse 94, temperature 98.9 F (37.2 C), temperature source Oral, resp. rate 20, height 5\' 2"  (1.575 m), weight 87.8 kg (193 lb 8 oz), last menstrual period 05/22/2017, SpO2 99 %.  PHYSICAL EXAMINATION:  GENERAL:  22 y.o.-year-old patient lying in the bed with no acute distress.  EYES: Pupils equal, round, reactive to light and accommodation. No scleral icterus. Extraocular muscles intact.  HEENT: Head atraumatic, normocephalic. Oropharynx and nasopharynx clear.  NECK:  Supple, no jugular venous distention. No thyroid enlargement, no tenderness.  LUNGS: Normal breath sounds bilaterally, no wheezing, rales,rhonchi or crepitation. No use of accessory muscles of respiration.  CARDIOVASCULAR: S1, S2 normal. No murmurs, rubs, or gallops.  ABDOMEN: Soft, nontender, nondistended. Bowel sounds present. No organomegaly or mass.  EXTREMITIES: No pedal edema, cyanosis, or clubbing.  NEUROLOGIC:  Cranial nerves II through XII are intact. Muscle strength 5/5 in all extremities. Sensation intact. Gait not checked.  PSYCHIATRIC: The patient is alert and oriented x 3.  SKIN: No obvious rash, lesion, or ulcer.   Physical Exam LABORATORY PANEL:   CBC Recent Labs  Lab 08/01/17 0632  WBC 11.8*  HGB 10.8*  HCT 32.4*  PLT 255   ------------------------------------------------------------------------------------------------------------------  Chemistries  Recent Labs  Lab 07/31/17 2246  08/02/17 0416  NA 135   < > 139  K 3.6   < > 3.8  CL 106   < > 110  CO2 16*   < > 22  GLUCOSE 98   < > 109*  BUN 13   < > 10  CREATININE 1.07*   < > 1.06*  CALCIUM 8.2*   < > 7.8*  AST 15  --   --   ALT 14  --   --   ALKPHOS 50  --   --   BILITOT 1.1  --   --    < > = values in this interval not displayed.   ------------------------------------------------------------------------------------------------------------------  Cardiac Enzymes No results for input(s): TROPONINI in the last 168 hours. ------------------------------------------------------------------------------------------------------------------  RADIOLOGY:  No results found.  ASSESSMENT AND PLAN:  1 acute bilateral pyelonephritis with associatedSIRS Resolving  Urine cultures noted for pansensitive E. coli  Discontinue Rocephin, start Ancef IV, decrease IV fluids for rehydration   2 acute SIRs Secondary to above Resolved  3 AKI Resolved with IV fluids for rehydration  4 acute bacterial vaginosis Resolving Change Flagyl to p.o. to complete 5-day course   5 acute headache  Tylenol mild headache, Percocet as needed severe headache   6 acute nausea  Most likely secondary to pyelonephritis Antiemetics PRN   Disposition to home on tomorrow barring any complications   All the records are reviewed and case discussed with Care Management/Social Workerr. Management plans discussed with the patient, family and  they are in agreement.  CODE STATUS: full  TOTAL TIME TAKING CARE OF THIS PATIENT: 35 minutes.     POSSIBLE D/C IN 1 DAYS, DEPENDING ON CLINICAL CONDITION.   Evelena AsaMontell D Aryanna Shaver M.D on 08/03/2017   Between 7am to 6pm - Pager - [phone]  After 6pm go to [email or web address] - Social research officer, governmentpassword EPAS [facility]  [facility] [place] [facility]  Office  [phone]  CC: Primary care physician; [facility]  Note: This dictation was prepared with Nurse, children'sDragon dictation along with smaller phrase technology. Any transcriptional errors that result from this process are unintentional.

## 2017-08-03 NOTE — Progress Notes (Signed)
Pharmacy Antibiotic Note  Latoya Benson is a 22 y.o. female admitted on 07/31/2017 with pyelonephritis.  Pharmacy has been consulted for ceftriaxone dosing.  This is day #4 of IV antibiotics  Plan: Change ceftriaxone to 1 g IV q24h  Height: 5\' 2"  (157.5 cm) Weight: 193 lb 8 oz (87.8 kg) IBW/kg (Calculated) : 50.1  Temp (24hrs), Avg:98.5 F (36.9 C), Min:97.7 F (36.5 C), Max:98.9 F (37.2 C)  Recent Labs  Lab 07/31/17 2143 07/31/17 2246 08/01/17 0632 08/02/17 0416  WBC 19.7*  --  11.8*  --   CREATININE  --  1.07* 1.20* 1.06*  LATICACIDVEN  --  1.1  --   --     Estimated Creatinine Clearance: 85 mL/min (A) (by C-G formula based on SCr of 1.06 mg/dL (H)).    No Known Allergies  Antimicrobials this admission: ceftriaxone 2/19 >>  metronidazole 2/20 >> 2/23  Dose adjustments this admission: 2/22 Changed CTX from 2 g IV daily to 1 g IV daily  Microbiology results: 2/22 UCx: E. coli   Thank you for allowing pharmacy to be a part of this patient's care.  Cindi CarbonMary M Cathryn Gallery, PharmD Clinical Pharmacist 08/03/2017 11:38 AM

## 2017-08-04 MED ORDER — CEPHALEXIN 500 MG PO CAPS: 500.0000 mg | ORAL_CAPSULE | Freq: Three times a day (TID) | ORAL | 0 refills | Status: AC

## 2017-08-04 MED ORDER — ONDANSETRON 4 MG PO TBDP: 4.0000 mg | ORAL_TABLET | Freq: Three times a day (TID) | ORAL | 0 refills | Status: DC | PRN

## 2017-08-04 MED ORDER — METRONIDAZOLE 500 MG PO TABS: 500.0000 mg | ORAL_TABLET | Freq: Three times a day (TID) | ORAL | 0 refills | Status: DC

## 2017-08-04 MED ORDER — ONDANSETRON HCL 4 MG/2ML IJ SOLN
4.0000 mg | Freq: Once | INTRAMUSCULAR | Status: AC
  Administered 2017-08-04: 09:00:00 4 mg via INTRAVENOUS

## 2017-08-04 NOTE — Progress Notes (Signed)
Discharge teaching given to pt. And spouse. With teach back. Pt. Spouse states he just ordered guest tray. This nurse tells both pt. And spouse to call nurse when ready for transport to discharge pt. Both agreed. Pt. Was given prescriptions and IV removed x1. When making rounds assigned CNA noted pt. And spouse not to be in room staff unsure how long pt has been gone. Dr. Elpidio AnisSudini made aware charge nurse aware.

## 2017-08-04 NOTE — Care Management Note (Addendum)
Case Management Note  Patient Details  Name: Tonye PearsonStephanie L Fouts MRN: [id number] Date of Birth: 10/24/1995  Subjective/Objective:    Ms Fayrene FearingJames was provided with a discount coupon for Keflex 500mg  PO x 20 days.  ms Fayrene FearingJames will follow up with the [facility] as an outpatient.              Action/Plan:   Expected Discharge Date:  08/04/17               Expected Discharge Plan:     In-House Referral:     Discharge planning Services     Post Acute Care Choice:    Choice offered to:     DME Arranged:    DME Agency:     HH Arranged:    HH Agency:     Status of Service:     If discussed at [employer] Meetings, dates discussed:    Additional Comments:  Alta Shober A, RN 08/04/2017, 9:54 AM

## 2017-08-04 NOTE — Discharge Instructions (Signed)
Resume diet and activity as before ° ° °

## 2017-08-05 LAB — CULTURE, BLOOD (ROUTINE X 2)
Culture: NO GROWTH
Culture: NO GROWTH
Special Requests: ADEQUATE

## 2017-08-10 NOTE — Discharge Summary (Signed)
[employer] [facility] - [place] at [facility]   PATIENT NAME: Latoya Benson    MR#:  [id number]  DATE OF BIRTH:  12-30-1995  DATE OF ADMISSION:  07/31/2017 ADMITTING PHYSICIAN: Cammy Copa, MD  DATE OF DISCHARGE: 08/04/2017 11:00 AM  PRIMARY CARE PHYSICIAN: [facility]   ADMISSION DIAGNOSIS:  Pyelonephritis [N12]  DISCHARGE DIAGNOSIS:  Active Problems:   Pyelonephritis   SECONDARY DIAGNOSIS:   Past Medical History:  Diagnosis Date  . Medical history non-contributory      ADMITTING HISTORY  HISTORY OF PRESENT ILLNESS: Latoya Benson  is a 22 y.o. female with a known history of recurrent urinary tract infections. Patient presented to emergency room for severe generalized weakness and body aches, nausea, vomiting and fever up to 102, going on for the past 2-3 days, gradually getting worse.  She denies any chest pain, shortness of breath, cough or diarrhea.  She was seen yesterday for the same symptoms, thought to have a viral syndrome; flu test was negative. Patient has been on Flagyl 3 times a day for bacterial vaginosis, prescribed 6 days ago. Upon evaluation in the emergency room, she is noted with fever and tachycardia.  She has bilateral flank tenderness, left worse than the right side.  UA is positive for UTI.  Creatinine level is slightly elevated at 1.07 and WBC is elevated at 19.7. She is admitted for further evaluation and treatment    HOSPITAL COURSE:   *   Acute bilateral pyelonephritis secondary to E coli *  Sepsis present on admission *  Acute kidney injury *  Bacterial vaginosis   patient was started on IV antibiotics. , IV fluids.  Sepsis resolved.  Patient's urine cultures returned with pansensitive E coli.  After this patient was switched to oral antibiotics and discharged home in stable condition.  She was also treated with Flagyl for her bacterial vaginosis.  Prescriptions given at discharge.  Follow-up with primary care  physician in 1 week.  CONSULTS OBTAINED:    DRUG ALLERGIES:  No Known Allergies  DISCHARGE MEDICATIONS:   Allergies as of 08/04/2017   No Known Allergies     Medication List    STOP taking these medications   famotidine 20 MG tablet Commonly known as:  PEPCID   meloxicam 15 MG tablet Commonly known as:  MOBIC   oseltamivir 75 MG capsule Commonly known as:  TAMIFLU   penicillin v potassium 500 MG tablet Commonly known as:  VEETID   sucralfate 1 GM/10ML suspension Commonly known as:  CARAFATE   traMADol 50 MG tablet Commonly known as:  ULTRAM     TAKE these medications   acetaminophen 500 MG tablet Commonly known as:  TYLENOL Take 2,000 mg by mouth every 6 (six) hours as needed.   cephALEXin 500 MG capsule Commonly known as:  KEFLEX Take 1 capsule (500 mg total) by mouth 3 (three) times daily for 6 days.   metroNIDAZOLE 500 MG tablet Commonly known as:  FLAGYL Take 1 tablet (500 mg total) by mouth 3 (three) times daily.   ondansetron 4 MG disintegrating tablet Commonly known as:  ZOFRAN-ODT Take 1 tablet (4 mg total) by mouth every 8 (eight) hours as needed for nausea or vomiting.       Today   VITAL SIGNS:  Blood pressure 104/61, pulse 95, temperature 99.7 F (37.6 C), temperature source Oral, resp. rate 18, height 5\' 2"  (1.575 m), weight 87.8 kg (193 lb 8 oz), last menstrual period 05/22/2017, SpO2  98 %.  I/O:  No intake or output data in the 24 hours ending 08/10/17 2007  PHYSICAL EXAMINATION:  Physical Exam  GENERAL:  22 y.o.-year-old patient lying in the bed with no acute distress.  LUNGS: Normal breath sounds bilaterally, no wheezing, rales,rhonchi or crepitation. No use of accessory muscles of respiration.  CARDIOVASCULAR: S1, S2 normal. No murmurs, rubs, or gallops.  ABDOMEN: Soft, non-tender, non-distended. Bowel sounds present. No organomegaly or mass.  NEUROLOGIC: Moves all 4 extremities. PSYCHIATRIC: The patient is alert and oriented x  3.  SKIN: No obvious rash, lesion, or ulcer.   DATA REVIEW:   CBC No results for input(s): WBC, HGB, HCT, PLT in the last 168 hours.  Chemistries  No results for input(s): NA, K, CL, CO2, GLUCOSE, BUN, CREATININE, CALCIUM, MG, AST, ALT, ALKPHOS, BILITOT in the last 168 hours.  Invalid input(s): GFRCGP  Cardiac Enzymes No results for input(s): TROPONINI in the last 168 hours.  Microbiology Results  Results for orders placed or performed during the hospital encounter of 07/31/17  Urine Culture     Status: Abnormal   Collection Time: 07/31/17 10:46 PM  Result Value Ref Range Status   Specimen Description   Final    URINE, RANDOM Performed at [facility], [address] [place], [place] [address]    Special Requests   Final    NONE Performed at [facility], [address] [place], [place] [address]    Culture >=100,000 COLONIES/mL ESCHERICHIA COLI (A)  Final   Report Status 08/03/2017 FINAL  Final   Organism ID, Bacteria ESCHERICHIA COLI (A)  Final      Susceptibility   Escherichia coli - MIC*    AMPICILLIN <=2 SENSITIVE Sensitive     CEFAZOLIN <=4 SENSITIVE Sensitive     CEFTRIAXONE <=1 SENSITIVE Sensitive     CIPROFLOXACIN <=0.25 SENSITIVE Sensitive     GENTAMICIN <=1 SENSITIVE Sensitive     IMIPENEM <=0.25 SENSITIVE Sensitive     NITROFURANTOIN <=16 SENSITIVE Sensitive     TRIMETH/SULFA <=20 SENSITIVE Sensitive     AMPICILLIN/SULBACTAM <=2 SENSITIVE Sensitive     PIP/TAZO <=4 SENSITIVE Sensitive     Extended ESBL NEGATIVE Sensitive     * >=100,000 COLONIES/mL ESCHERICHIA COLI  Blood culture (routine x 2)     Status: None   Collection Time: 07/31/17 10:46 PM  Result Value Ref Range Status   Specimen Description BLOOD BLRA  Final   Special Requests   Final    BOTTLES DRAWN AEROBIC AND ANAEROBIC Blood Culture results may not be optimal due to an excessive volume of blood received in culture bottles   Culture   Final    NO GROWTH 5  DAYS Performed at [facility], [address] [place], [place] [address]    Report Status 08/05/2017 FINAL  Final  Blood culture (routine x 2)     Status: None   Collection Time: 07/31/17 10:46 PM  Result Value Ref Range Status   Specimen Description BLOOD RAC  Final   Special Requests   Final    BOTTLES DRAWN AEROBIC AND ANAEROBIC Blood Culture adequate volume   Culture   Final    NO GROWTH 5 DAYS Performed at [facility], [address] [place], [place] [address]    Report Status 08/05/2017 FINAL  Final    RADIOLOGY:  No results found.  Follow up with PCP in 1 week.  Management plans discussed with the patient, family and they are  in agreement.  CODE STATUS:  Code Status History    Date Active Date Inactive Code Status Order ID Comments User Context   08/01/2017 00:41 08/04/2017 14:22 Full Code [id number]  Cammy Copa, MD Inpatient   04/02/2016 10:24 04/05/2016 16:19 Full Code [id number]  Hower, Cletis [place], MD ED      TOTAL TIME TAKING CARE OF THIS PATIENT ON DAY OF DISCHARGE: more than 30 minutes.   Molinda Bailiff Tanazia Achee M.D on 08/10/2017 at 8:07 PM  Between 7am to 6pm - Pager - [phone]  After 6pm go to [email or web address] - password [email or web address] [facility]  [facility] [place] [facility]  Office  [phone]  CC: Primary care physician; [facility]  Note: This dictation was prepared with Nurse, children's dictation along with smaller phrase technology. Any transcriptional errors that result from this process are unintentional.

## 2017-10-07 ENCOUNTER — Other Ambulatory Visit: Payer: Self-pay

## 2017-10-07 ENCOUNTER — Encounter: Payer: Self-pay | Admitting: *Deleted

## 2017-10-07 DIAGNOSIS — L02215 Cutaneous abscess of perineum: Secondary | ICD-10-CM | POA: Insufficient documentation

## 2017-10-07 DIAGNOSIS — Z87891 Personal history of nicotine dependence: Secondary | ICD-10-CM | POA: Insufficient documentation

## 2017-10-07 NOTE — ED Triage Notes (Signed)
Pt has abscess to left inner thigh.  Area red and tender.  No drainage today.  Painful to ambulate.

## 2017-10-08 ENCOUNTER — Emergency Department
Admission: EM | Admit: 2017-10-08 | Discharge: 2017-10-08 | Disposition: A | Discharge status: Home / Self Care | Payer: Self-pay | Attending: Emergency Medicine | Admitting: Emergency Medicine

## 2017-10-08 DIAGNOSIS — L0291 Cutaneous abscess, unspecified: Secondary | ICD-10-CM

## 2017-10-08 LAB — POCT PREGNANCY, URINE: Preg Test, Ur: NEGATIVE

## 2017-10-08 MED ORDER — SULFAMETHOXAZOLE-TRIMETHOPRIM 800-160 MG PO TABS
1.0000 | ORAL_TABLET | Freq: Once | ORAL | Status: AC
  Administered 2017-10-08: 1 via ORAL
  Filled 2017-10-08: qty 1

## 2017-10-08 MED ORDER — SULFAMETHOXAZOLE-TRIMETHOPRIM 800-160 MG PO TABS: 1.0000 | ORAL_TABLET | Freq: Two times a day (BID) | ORAL | 0 refills | Status: DC

## 2017-10-08 MED ORDER — OXYCODONE-ACETAMINOPHEN 5-325 MG PO TABS
1.0000 | ORAL_TABLET | Freq: Once | ORAL | Status: AC
  Administered 2017-10-08: 1 via ORAL
  Filled 2017-10-08: qty 1

## 2017-10-08 MED ORDER — LIDOCAINE-EPINEPHRINE (PF) 1 %-1:200000 IJ SOLN
INTRAMUSCULAR | Status: AC
  Filled 2017-10-08: qty 30

## 2017-10-08 NOTE — ED Notes (Signed)
Patient with abscess to left side of labia that started two days ago. Patient reports that has become worse last night.

## 2017-10-08 NOTE — ED Notes (Signed)
ED Provider at bedside. 

## 2017-10-08 NOTE — ED Provider Notes (Signed)
[facility] Emergency Department Provider Note  ___________________________________________   First MD Initiated Contact with Patient 10/08/17 [phone]     (approximate)  I have reviewed the triage vital signs and the nursing notes.   HISTORY  Chief Complaint Abscess   HPI Latoya Benson is a 22 y.o. female with a history of an abscess on her back in the past was presented to the emergency department 24 hours of a painful, worsening abscess to her left perineum.  She says that her boyfriend tried to pop the abscess but it closed back up and is now gotten larger.  Patient saying that it is now hurting to walk.  Boyfriend reported on a lot of drainage from the abscess when he "popped it" approximately 24 hours ago.  Past Medical History:  Diagnosis Date  . Medical history non-contributory     Patient Active Problem List   Diagnosis Date Noted  . Pyelonephritis 07/31/2017  . Sepsis (HCC) 04/02/2016    Past Surgical History:  Procedure Laterality Date  . CHOLECYSTECTOMY    . COLON SURGERY    . 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 Authorizing Provider  acetaminophen (TYLENOL) 500 MG tablet Take 2,000 mg by mouth every 6 (six) hours as needed.    [provider]  metroNIDAZOLE (FLAGYL) 500 MG tablet Take 1 tablet (500 mg total) by mouth 3 (three) times daily. 08/04/17   Milagros Loll, MD  ondansetron (ZOFRAN-ODT) 4 MG disintegrating tablet Take 1 tablet (4 mg total) by mouth every 8 (eight) hours as needed for nausea or vomiting. 08/04/17   Milagros Loll, MD    Allergies Patient has no known allergies.  Family History  Problem Relation Age of Onset  . Diabetes Neg Hx     Social History Social History   Tobacco Use  . Smoking status: Former Games developer  . Smokeless tobacco: Never Used  Substance Use Topics  . Alcohol use: Yes  . Drug use: No    Review of  Systems  Constitutional: No fever/chills Eyes: No visual changes. ENT: No sore throat. Cardiovascular: Denies chest pain. Respiratory: Denies shortness of breath. Gastrointestinal: No abdominal pain.  No nausea, no vomiting.  No diarrhea.  No constipation. Genitourinary: Negative for dysuria. Musculoskeletal: Negative for back pain. Skin: As above Neurological: Negative for headaches, focal weakness or numbness.   ____________________________________________   PHYSICAL EXAM:  VITAL SIGNS: ED Triage Vitals  Enc Vitals Group     BP 10/07/17 2338 133/76     Pulse Rate 10/07/17 2338 79     Resp 10/07/17 2338 20     Temp 10/07/17 2338 98.9 F (37.2 C)     Temp Source 10/07/17 2338 Oral     SpO2 10/07/17 2338 100 %     Weight 10/07/17 2337 192 lb (87.1 kg)     Height 10/07/17 2337  (1.575 m)     Head Circumference --      Peak Flow --      Pain Score 10/07/17 2337 8     Pain Loc --      Pain Edu? --      Excl. in GC? --     Constitutional: Alert and oriented. Well appearing and in no acute distress. Eyes: Conjunctivae are normal.  Head: Atraumatic. Nose: No congestion/rhinnorhea. Mouth/Throat: Mucous membranes are moist.  Neck: No stridor.   Cardiovascular: Normal rate, regular  rhythm. Grossly normal heart sounds.  Respiratory: Normal respiratory effort.  No retractions. Lungs CTAB. Gastrointestinal: Soft and nontender. No distention.  Musculoskeletal: No lower extremity tenderness nor edema.  No joint effusions. Neurologic:  Normal speech and language. No gross focal neurologic deficits are appreciated. Skin: 2 cm, raised, fluctuant lesion that is erythematous and very tender to the left perineum, posterior to the left labia majora but anterior to the rectum.  Very small/0.5 cm wheal of surrounding induration. Psychiatric: Mood and affect are normal. Speech and behavior are normal.  ____________________________________________   LABS (all labs ordered are  listed, but only abnormal results are displayed)  Labs Reviewed  POCT PREGNANCY, URINE  POC URINE PREG, ED   ____________________________________________  EKG   ____________________________________________  RADIOLOGY   ____________________________________________   PROCEDURES  Procedure(s) performed:   Marland KitchenMarland KitchenIncision and Drainage Date/Time: 10/08/2017 5:22 AM Performed by: Myrna Blazer, MD Authorized by: Myrna Blazer, MD   Consent:    Consent obtained:  Verbal   Consent given by:  Patient   Risks discussed:  Bleeding, infection, incomplete drainage and pain   Alternatives discussed:  Alternative treatment, delayed treatment, observation and no treatment Location:    Type:  Abscess   Size:  3cm   Location:  Anogenital   Anogenital location:  Perineum Pre-procedure details:    Skin preparation:  Chloraprep Anesthesia (see MAR for exact dosages):    Anesthesia method:  Local infiltration   Local anesthetic:  Lidocaine 1% WITH epi Procedure type:    Complexity:  Complex Procedure details:    Needle aspiration: no     Incision types:  Single straight   Incision depth:  Subcutaneous   Scalpel blade:  11   Wound management:  Probed and deloculated and irrigated with saline   Drainage:  Bloody and serous   Drainage amount:  Moderate   Wound treatment:  Wound left open   Packing materials:  1/4 in gauze Post-procedure details:    Patient tolerance of procedure:  Tolerated well, no immediate complications    Critical Care performed:   ____________________________________________   INITIAL IMPRESSION / ASSESSMENT AND PLAN / ED COURSE  Pertinent labs & imaging results that were available during my care of the patient were reviewed by me and considered in my medical decision making (see chart for details).  DDX: Abscess, cellulitis, sebaceous cyst As part of my medical decision making, I reviewed the following data within the electronic  MEDICAL RECORD NUMBER Notes from prior ED visits  ----------------------------------------- 5:24 AM on 10/08/2017 -----------------------------------------  Patient tolerated the procedure well.  Packing left in and she is understanding of the packing needing to come out in 48 hours.  She will be discharged with Bactrim.  She will be following up with her primary care doctor.  She is understanding of the treatment plan and willing to comply. ____________________________________________   FINAL CLINICAL IMPRESSION(S) / ED DIAGNOSES  Abscess.    NEW MEDICATIONS STARTED DURING THIS VISIT:  New Prescriptions   No medications on file     Note:  This document was prepared using Dragon voice recognition software and may include unintentional dictation errors.     Myrna Blazer, MD 10/08/17 [phone]

## 2018-01-20 ENCOUNTER — Emergency Department
Admission: EM | Admit: 2018-01-20 | Discharge: 2018-01-21 | Disposition: A | Discharge status: Home / Self Care | Payer: Self-pay | Attending: Emergency Medicine | Admitting: Emergency Medicine

## 2018-01-20 ENCOUNTER — Emergency Department: Payer: Self-pay

## 2018-01-20 ENCOUNTER — Other Ambulatory Visit: Payer: Self-pay

## 2018-01-20 DIAGNOSIS — Z87891 Personal history of nicotine dependence: Secondary | ICD-10-CM | POA: Insufficient documentation

## 2018-01-20 DIAGNOSIS — M7918 Myalgia, other site: Secondary | ICD-10-CM | POA: Insufficient documentation

## 2018-01-20 DIAGNOSIS — Z79899 Other long term (current) drug therapy: Secondary | ICD-10-CM | POA: Insufficient documentation

## 2018-01-20 DIAGNOSIS — R102 Pelvic and perineal pain: Secondary | ICD-10-CM | POA: Insufficient documentation

## 2018-01-20 DIAGNOSIS — R1030 Lower abdominal pain, unspecified: Secondary | ICD-10-CM

## 2018-01-20 DIAGNOSIS — R5381 Other malaise: Secondary | ICD-10-CM | POA: Insufficient documentation

## 2018-01-20 DIAGNOSIS — R350 Frequency of micturition: Secondary | ICD-10-CM | POA: Insufficient documentation

## 2018-01-20 DIAGNOSIS — R110 Nausea: Secondary | ICD-10-CM | POA: Insufficient documentation

## 2018-01-20 LAB — CBC WITH DIFFERENTIAL/PLATELET
BASOS ABS: 0.1 10*3/uL (ref 0–0.1)
BASOS PCT: 1 %
Eosinophils Absolute: 0.3 10*3/uL (ref 0–0.7)
Eosinophils Relative: 4 %
HEMATOCRIT: 37 % (ref 35.0–47.0)
HEMOGLOBIN: 12.7 g/dL (ref 12.0–16.0)
Lymphocytes Relative: 38 %
Lymphs Abs: 2.9 10*3/uL (ref 1.0–3.6)
MCH: 31.5 pg (ref 26.0–34.0)
MCHC: 34.3 g/dL (ref 32.0–36.0)
MCV: 91.7 fL (ref 80.0–100.0)
MONOS PCT: 7 %
Monocytes Absolute: 0.6 10*3/uL (ref 0.2–0.9)
NEUTROS PCT: 50 %
Neutro Abs: 3.8 10*3/uL (ref 1.4–6.5)
Platelets: 392 10*3/uL (ref 150–440)
RBC: 4.03 MIL/uL (ref 3.80–5.20)
RDW: 13.8 % (ref 11.5–14.5)
WBC: 7.7 10*3/uL (ref 3.6–11.0)

## 2018-01-20 LAB — URINALYSIS, COMPLETE (UACMP) WITH MICROSCOPIC
Bacteria, UA: NONE SEEN
Bilirubin Urine: NEGATIVE
Glucose, UA: NEGATIVE mg/dL
Hgb urine dipstick: NEGATIVE
KETONES UR: NEGATIVE mg/dL
LEUKOCYTES UA: NEGATIVE
Nitrite: NEGATIVE
PH: 5 (ref 5.0–8.0)
Protein, ur: NEGATIVE mg/dL
SPECIFIC GRAVITY, URINE: 1.012 (ref 1.005–1.030)

## 2018-01-20 LAB — BASIC METABOLIC PANEL
ANION GAP: 8 (ref 5–15)
BUN: 14 mg/dL (ref 6–20)
CALCIUM: 9.4 mg/dL (ref 8.9–10.3)
CO2: 25 mmol/L (ref 22–32)
Chloride: 108 mmol/L (ref 98–111)
Creatinine, Ser: 0.81 mg/dL (ref 0.44–1.00)
GFR calc non Af Amer: >60 mL/min (ref >60)
Glucose, Bld: 104 mg/dL — ABNORMAL HIGH (ref 70–99)
Potassium: 3.8 mmol/L (ref 3.5–5.1)
Sodium: 141 mmol/L (ref 135–145)

## 2018-01-20 LAB — POCT PREGNANCY, URINE: Preg Test, Ur: NEGATIVE

## 2018-01-20 MED ORDER — IBUPROFEN 600 MG PO TABS: 600.0000 mg | ORAL_TABLET | Freq: Four times a day (QID) | ORAL | 0 refills | Status: DC | PRN

## 2018-01-20 NOTE — ED Triage Notes (Signed)
Patient presents with lower abdominal pain x two weeks. States now it's just constant. Frequent urination and "feels like I pee every five minutes." Denies itchy, burning or pain. Denies vaginal discharge or bleeding.

## 2018-01-20 NOTE — ED Provider Notes (Signed)
[facility] Emergency Department Provider Note ____________________________________________   First MD Initiated Contact with Patient 01/20/18 2219     (approximate)  I have reviewed the triage vital signs and the nursing notes.   HISTORY  Chief Complaint Abdominal Pain    HPI Latoya Benson is a 22 y.o. female with PMH as noted below who presents with lower abdominal pain, mainly left-sided, intermittently over the last 2 weeks, but now more constant in the last several days.  It is nonradiating.  Patient reports frequent urination, but no dysuria, and she denies any vaginal bleeding or discharge.  She states that she has not had regular periods for several months.  She also reports some generalized body aches and malaise, and nausea but no vomiting.   Past Medical History:  Diagnosis Date  . Medical history non-contributory     Patient Active Problem List   Diagnosis Date Noted  . Pyelonephritis 07/31/2017  . Sepsis (HCC) 04/02/2016    Past Surgical History:  Procedure Laterality Date  . CHOLECYSTECTOMY    . COLON SURGERY    . 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 Authorizing Provider  acetaminophen (TYLENOL) 500 MG tablet Take 2,000 mg by mouth every 6 (six) hours as needed.    [provider]  ibuprofen (ADVIL,MOTRIN) 600 MG tablet Take 1 tablet (600 mg total) by mouth every 6 (six) hours as needed. 01/20/18   Dionne Bucy, MD  metroNIDAZOLE (FLAGYL) 500 MG tablet Take 1 tablet (500 mg total) by mouth 3 (three) times daily. 08/04/17   Milagros Loll, MD  ondansetron (ZOFRAN-ODT) 4 MG disintegrating tablet Take 1 tablet (4 mg total) by mouth every 8 (eight) hours as needed for nausea or vomiting. 08/04/17   Milagros Loll, MD  sulfamethoxazole-trimethoprim (BACTRIM DS,SEPTRA DS) 800-160 MG tablet Take 1 tablet by mouth 2 (two) times daily. 10/08/17    Schaevitz, Myra Rude, MD    Allergies Patient has no known allergies.  Family History  Problem Relation Age of Onset  . Diabetes Neg Hx     Social History Social History   Tobacco Use  . Smoking status: Former Games developer  . Smokeless tobacco: Never Used  Substance Use Topics  . Alcohol use: Yes  . Drug use: No    Review of Systems  Constitutional: No fever. Eyes: No redness. ENT: No sore throat. Cardiovascular: Denies chest pain. Respiratory: Denies shortness of breath. Gastrointestinal: Positive for nausea.  No vomiting, diarrhea, or constipation.  Genitourinary: Negative for dysuria.  No vaginal bleeding or discharge. Musculoskeletal: Negative for back pain. Skin: Negative for rash. Neurological: Negative for headache.   ____________________________________________   PHYSICAL EXAM:  VITAL SIGNS: ED Triage Vitals  Enc Vitals Group     BP 01/20/18 2109 (!) 138/91     Pulse Rate 01/20/18 2109 (!) 104     Resp 01/20/18 2109 16     Temp 01/20/18 2109 98.7 F (37.1 C)     Temp Source 01/20/18 2109 Oral     SpO2 01/20/18 2109 100 %     Weight 01/20/18 2110 200 lb (90.7 kg)     Height 01/20/18 2110 5\' 2"  (1.575 m)     Head Circumference --      Peak Flow --      Pain Score 01/20/18 2125 5     Pain Loc --      Pain Edu? --  Excl. in GC? --     Constitutional: Alert and oriented. Well appearing and in no acute distress. Eyes: Conjunctivae are normal.  No scleral icterus. Head: Atraumatic. Nose: No congestion/rhinnorhea. Mouth/Throat: Mucous membranes are moist.   Neck: Normal range of motion.  Cardiovascular:  Good peripheral circulation. Respiratory: Normal respiratory effort.  No retractions. Gastrointestinal: Soft with mild left suprapubic tenderness. No distention.  Genitourinary: No flank tenderness. Musculoskeletal: Extremities warm and well perfused.  Neurologic:  Normal speech and language. No gross focal neurologic deficits are appreciated.   Skin:  Skin is warm and dry. No rash noted. Psychiatric: Mood and affect are normal. Speech and behavior are normal.  ____________________________________________   LABS (all labs ordered are listed, but only abnormal results are displayed)  Labs Reviewed  URINALYSIS, COMPLETE (UACMP) WITH MICROSCOPIC - Abnormal; Notable for the following components:      Result Value   Color, Urine YELLOW (*)    APPearance HAZY (*)    All other components within normal limits  BASIC METABOLIC PANEL - Abnormal; Notable for the following components:   Glucose, Bld 104 (*)    All other components within normal limits  CBC WITH DIFFERENTIAL/PLATELET  POCT PREGNANCY, URINE   ____________________________________________  EKG   ____________________________________________  RADIOLOGY  US pelvis: Pending  ____________________________________________   PROCEDURES  Procedure(s) performed: No  Procedures  Critical Care performed: No ____________________________________________   INITIAL IMPRESSION / ASSESSMENT AND PLAN / ED COURSE  Pertinent labs & imaging results that were available during my care of the patient were reviewed by me and considered in my medical decision making (see chart for details).  22 year old female with PMH as noted above presents with left suprapubic/pelvic pain over the last several weeks, associated with urinary frequency.  The patient denies any dysuria, fever, flank pain, or any vaginal bleeding or discharge.  She is sexually active with one partner.  On exam, the patient is well-appearing, vital signs are normal except for borderline elevated heart rate at triage, the abdomen is soft with very mild tenderness in the left suprapubic area.  The patient was offered pelvic exam but declines.  Overall I suspect most likely subacute gynecologic cause such as ovarian cyst, fibroid, endometriosis, or other benign cause.  Given the patient's well appearance there is no  evidence of torsion.  She has no GI symptoms to suggest colitis or diverticulitis.  She is low risk for PID and given the lack of discharge or other pelvic symptoms, there is no clinical evidence for this.  I would also consider UTI, however the urine is completely unremarkable.  Plan: Basic labs, pelvic ultrasound, and reassess.  Anticipate discharge home with OB/GYN follow-up.  Clinical Course as of Jan 21 2312  Wynelle LinkSun Jan 20, 2018  2231 Bacteria, UA: NONE SEEN [SS]    Clinical Course User Index [SS] Dionne BucySiadecki, Harlyn Rathmann, MD   ----------------------------------------- 11:13 PM on 01/20/2018 -----------------------------------------  Patient is pending ultrasound.  I am signing the patient out to the oncoming physician Dr. Zenda AlpersWebster.  ____________________________________________   FINAL CLINICAL IMPRESSION(S) / ED DIAGNOSES  Final diagnoses:  Pelvic pain      NEW MEDICATIONS STARTED DURING THIS VISIT:  New Prescriptions   IBUPROFEN (ADVIL,MOTRIN) 600 MG TABLET    Take 1 tablet (600 mg total) by mouth every 6 (six) hours as needed.     Note:  This document was prepared using Dragon voice recognition software and may include unintentional dictation errors.    Dionne BucySiadecki, Kerisha Goughnour, MD 01/20/18 [phone]

## 2018-01-20 NOTE — ED Notes (Signed)
Pt reports she has not had her period since May. Reports negative pregnancy test. Reports having constant dull aching pain to her pelvic region. Denies urinary sx. Denies vaginal discharge.

## 2018-01-20 NOTE — Discharge Instructions (Addendum)
The ibuprofen as needed for pain.  Make an appointment to follow-up with the OB/GYN within the next few weeks for further outpatient management of the pain.  Return to the ER for new, worsening, persistent severe pain, vomiting, fever, vaginal bleeding or discharge, or any other new or worsening symptoms that concern you.

## 2018-01-21 MED ORDER — IBUPROFEN 600 MG PO TABS
600.0000 mg | ORAL_TABLET | Freq: Once | ORAL | Status: AC
  Administered 2018-01-21: 600 mg via ORAL
  Filled 2018-01-21: qty 1

## 2018-01-21 NOTE — ED Notes (Signed)
Pt sleeping on the bed with her boyfriend at this time. Awakens to voice.

## 2018-01-21 NOTE — ED Provider Notes (Signed)
-----------------------------------------   12:56 AM on 01/21/2018 -----------------------------------------   Blood pressure (!) 137/93, pulse 85, temperature 98.7 F (37.1 C), temperature source Oral, resp. rate 16, height 5\' 2"  (1.575 m), weight 90.7 kg, SpO2 99 %.  Assuming care from Dr. Marisa SeverinSiadecki.  In short, Latoya Benson is a 22 y.o. female with a chief complaint of Abdominal Pain .  Refer to the original H&P for additional details.  The current plan of care is to follow up the results of the ultrasound and disposition the patient.   Clinical Course as of Jan 22 55  Wynelle LinkSun Jan 20, 2018  2231 Bacteria, UA: NONE SEEN [SS]    Clinical Course User Index [SS] Dionne BucySiadecki, Sebastian, MD   Ultrasound pelvis: No acute process identified, numerous small follicles in the ovaries greater than 25 follicles in the right ovary and greater than 10 cc volume findings compatible with polycystic ovarian syndrome.  I did give the patient a dose of ibuprofen.  She will be discharged home and encouraged to follow-up with OB/GYN.   Rebecka ApleyWebster, Dalana Pfahler P, MD 01/21/18 [phone]

## 2018-02-15 ENCOUNTER — Encounter: Payer: Self-pay | Admitting: Obstetrics and Gynecology

## 2018-02-15 ENCOUNTER — Ambulatory Visit: Payer: Self-pay | Admitting: Obstetrics and Gynecology

## 2018-02-15 VITALS — BP 142/78 | HR 85 | Ht 62.0 in | Wt 207.0 lb

## 2018-02-15 DIAGNOSIS — E282 Polycystic ovarian syndrome: Secondary | ICD-10-CM

## 2018-02-15 DIAGNOSIS — N970 Female infertility associated with anovulation: Secondary | ICD-10-CM

## 2018-02-15 NOTE — Progress Notes (Signed)
HPI:      [name] is a 22 y.o. No obstetric history on file. who LMP was Patient's last menstrual period was 11/01/2017 (exact date).  Subjective:   She presents today after being seen in the emergency department for pelvic pain.  An ultrasound performed at that time showed multicystic ovaries possibly consistent with PCO.  Patient states that she has had irregular cycles over the last 2 years since a second trimester miscarriage.  She is considering pregnancy in the near future, although she is currently doubt of exact timing of this pregnancy.  She wants to know if she really has PCO and what it means for future fertility.    Hx: The following portions of the patient's history were reviewed and updated as appropriate:             She  has a past medical history of Medical history non-contributory. She does not have any pertinent problems on file. She  has a past surgical history that includes Colon surgery; Cholecystectomy; and intestinal surgery at birth. Her family history is not on file. She  reports that she has quit smoking. She has never used smokeless tobacco. She reports that she drinks alcohol. She reports that she does not use drugs. She has a current medication list which includes the following prescription(s): acetaminophen, ibuprofen, metronidazole, ondansetron, and sulfamethoxazole-trimethoprim. She has No Known Allergies.       Review of Systems:  Review of Systems  Constitutional: Denied constitutional symptoms, night sweats, recent illness, fatigue, fever, insomnia and weight loss.  Eyes: Denied eye symptoms, eye pain, photophobia, vision change and visual disturbance.  Ears/Nose/Throat/Neck: Denied ear, nose, throat or neck symptoms, hearing loss, nasal discharge, sinus congestion and sore throat.  Cardiovascular: Denied cardiovascular symptoms, arrhythmia, chest pain/pressure, edema, exercise intolerance, orthopnea and palpitations.  Respiratory: Denied  pulmonary symptoms, asthma, pleuritic pain, productive sputum, cough, dyspnea and wheezing.  Gastrointestinal: Denied, gastro-esophageal reflux, melena, nausea and vomiting.  Genitourinary: Denied genitourinary symptoms including symptomatic vaginal discharge, pelvic relaxation issues, and urinary complaints.  Musculoskeletal: Denied musculoskeletal symptoms, stiffness, swelling, muscle weakness and myalgia.  Dermatologic: Denied dermatology symptoms, rash and scar.  Neurologic: Denied neurology symptoms, dizziness, headache, neck pain and syncope.  Psychiatric: Denied psychiatric symptoms, anxiety and depression.  Endocrine: Denied endocrine symptoms including hot flashes and night sweats.   Meds:   Current Outpatient Medications on File Prior to Visit  Medication Sig Dispense Refill  . acetaminophen (TYLENOL) 500 MG tablet Take 2,000 mg by mouth every 6 (six) hours as needed.    Marland Kitchen ibuprofen (ADVIL,MOTRIN) 600 MG tablet Take 1 tablet (600 mg total) by mouth every 6 (six) hours as needed. (Patient not taking: Reported on 02/15/2018) 30 tablet 0  . metroNIDAZOLE (FLAGYL) 500 MG tablet Take 1 tablet (500 mg total) by mouth 3 (three) times daily. (Patient not taking: Reported on 02/15/2018) 12 tablet 0  . ondansetron (ZOFRAN-ODT) 4 MG disintegrating tablet Take 1 tablet (4 mg total) by mouth every 8 (eight) hours as needed for nausea or vomiting. (Patient not taking: Reported on 02/15/2018) 20 tablet 0  . sulfamethoxazole-trimethoprim (BACTRIM DS,SEPTRA DS) 800-160 MG tablet Take 1 tablet by mouth 2 (two) times daily. (Patient not taking: Reported on 02/15/2018) 20 tablet 0   No current facility-administered medications on file prior to visit.     Objective:     Vitals:   02/15/18 1003  BP: (!) 142/78  Pulse: 85    Ultrasound results reviewed revealing multicystic ovaries.  Assessment:    No obstetric history on file. Patient Active Problem List   Diagnosis Date Noted  .  Pyelonephritis 07/31/2017  . Sepsis (HCC) 04/02/2016     1. Oligo-ovulation   2. PCOS (polycystic ovarian syndrome)     Patient with symptoms and physical signs (multicystic ovaries) of PCO.   Plan:            1.  Standard PCO labs to confirm diagnosis  2.  Patient to decide regarding management of PCO if she has it.  (I.e. immediate pregnancy versus OCPs for birth control and cycle control) Orders Orders Placed This Encounter  Procedures  . DHEA-sulfate  . FSH/LH  . Glucose, fasting  . Insulin, random  . Prolactin  . Testosterone, Free, Total, SHBG  . TSH    No orders of the defined types were placed in this encounter.     F/U  Return for We will contact her with any abnormal test results. I spent 31 minutes involved in the care of this patient of which greater than 50% was spent discussing PCO, signs and symptoms, meaning of irregular cycles, risk of endometrial cancer, fertility issues, lack of ovulation, possible treatment options including OCPs and possible future fertility care.  All questions answered.  Elonda Husky, M.D. 02/15/2018 2:20 PM

## 2018-02-15 NOTE — Progress Notes (Signed)
Pt states worsening pain since the beginning of August. Pt also states no regular period since May. ER Dr. Diagnosed her with PCOS on August 12, per patient.

## 2018-02-18 ENCOUNTER — Other Ambulatory Visit: Payer: Self-pay

## 2018-02-18 NOTE — Addendum Note (Signed)
Addended by: Smith Mince on: 02/18/2018 08:35 AM   Modules accepted: Orders

## 2018-02-19 LAB — TESTOSTERONE, FREE, TOTAL, SHBG
Sex Hormone Binding: 12.1 nmol/L — ABNORMAL LOW (ref 24.6–122.0)
Testosterone, Free: 5.1 pg/mL — ABNORMAL HIGH (ref 0.0–4.2)
Testosterone: 49 ng/dL — ABNORMAL HIGH (ref 8–48)

## 2018-02-19 LAB — DHEA-SULFATE: DHEA-SO4: 255.9 ug/dL (ref 110.0–431.7)

## 2018-02-19 LAB — FSH/LH
FSH: 5.8 m[IU]/mL
LH: 12.8 m[IU]/mL

## 2018-02-19 LAB — TSH: TSH: 3.94 u[IU]/mL (ref 0.450–4.500)

## 2018-02-19 LAB — INSULIN, RANDOM: INSULIN: 50.4 u[IU]/mL — ABNORMAL HIGH (ref 2.6–24.9)

## 2018-02-19 LAB — PROLACTIN: Prolactin: 22.4 ng/mL (ref 4.8–23.3)

## 2018-02-19 LAB — GLUCOSE, FASTING: GLUCOSE, PLASMA: 92 mg/dL (ref 65–99)

## 2018-02-20 ENCOUNTER — Telehealth: Payer: Self-pay

## 2018-02-20 NOTE — Telephone Encounter (Signed)
Called pt to inform her of lab results. Call was unanswered and no VM.

## 2018-02-21 NOTE — Telephone Encounter (Signed)
Informed Pt of lab results per DJE. Patient understood.

## 2018-03-02 ENCOUNTER — Other Ambulatory Visit: Payer: Self-pay

## 2018-03-02 ENCOUNTER — Emergency Department
Admission: EM | Admit: 2018-03-02 | Discharge: 2018-03-02 | Disposition: A | Discharge status: Home / Self Care | Payer: Self-pay | Attending: Student in an Organized Health Care Education/Training Program | Admitting: Student in an Organized Health Care Education/Training Program

## 2018-03-02 ENCOUNTER — Encounter: Payer: Self-pay | Admitting: Emergency Medicine

## 2018-03-02 DIAGNOSIS — L02415 Cutaneous abscess of right lower limb: Secondary | ICD-10-CM | POA: Insufficient documentation

## 2018-03-02 DIAGNOSIS — Z87891 Personal history of nicotine dependence: Secondary | ICD-10-CM | POA: Insufficient documentation

## 2018-03-02 HISTORY — DX: Polycystic ovarian syndrome: E28.2

## 2018-03-02 MED ORDER — HYDROCODONE-ACETAMINOPHEN 5-325 MG PO TABS: 1.0000 | ORAL_TABLET | Freq: Four times a day (QID) | ORAL | 0 refills | Status: DC | PRN

## 2018-03-02 MED ORDER — SULFAMETHOXAZOLE-TRIMETHOPRIM 800-160 MG PO TABS: 1.0000 | ORAL_TABLET | Freq: Two times a day (BID) | ORAL | 0 refills | Status: DC

## 2018-03-02 NOTE — ED Triage Notes (Signed)
Printed discharge signature page, topaz not working

## 2018-03-02 NOTE — ED Triage Notes (Signed)
Abscess R upper thigh x 2 days.

## 2018-03-02 NOTE — ED Provider Notes (Signed)
[facility] Emergency Department Provider Note   ____________________________________________   First MD Initiated Contact with Patient 03/02/18 [phone]     (approximate)  I have reviewed the triage vital signs and the nursing notes.   HISTORY  Chief Complaint Abscess   HPI Latoya Benson is a 22 y.o. female presents to the ED today with complaint of questionable abscess to her right thigh.  Patient states that yesterday morning she noticed a small area and by last evening area was becoming red and swollen.  This morning she reports that it is extremely sore and tender.  She denies any injury.  Rates her pain as an 8 out of 10.  Past Medical History:  Diagnosis Date  . Medical history non-contributory   . PCOS (polycystic ovarian syndrome)     Patient Active Problem List   Diagnosis Date Noted  . Pyelonephritis 07/31/2017  . Sepsis (HCC) 04/02/2016    Past Surgical History:  Procedure Laterality Date  . CHOLECYSTECTOMY    . COLON SURGERY    . 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 Authorizing Provider  acetaminophen (TYLENOL) 500 MG tablet Take 2,000 mg by mouth every 6 (six) hours as needed.    [provider]  HYDROcodone-acetaminophen (NORCO/VICODIN) 5-325 MG tablet Take 1 tablet by mouth every 6 (six) hours as needed for moderate pain. 03/02/18   Tommi RumpsSummers, Rhonda L, PA-C  sulfamethoxazole-trimethoprim (BACTRIM DS,SEPTRA DS) 800-160 MG tablet Take 1 tablet by mouth 2 (two) times daily. 03/02/18   Tommi RumpsSummers, Rhonda L, PA-C    Allergies Patient has no known allergies.  Family History  Problem Relation Age of Onset  . Diabetes Neg Hx     Social History Social History   Tobacco Use  . Smoking status: Former Games developermoker  . Smokeless tobacco: Never Used  Substance Use Topics  . Alcohol use: Yes  . Drug use: No    Review of Systems Constitutional: No  fever/chills Cardiovascular: Denies chest pain. Respiratory: Denies shortness of breath. Musculoskeletal: Negative for muscle aches. Skin: Redness right upper thigh. Neurological: Negative for headaches, focal weakness or numbness. ___________________________________________   PHYSICAL EXAM:  VITAL SIGNS: ED Triage Vitals  Enc Vitals Group     BP 03/02/18 0809 130/76     Pulse Rate 03/02/18 0809 79     Resp 03/02/18 0809 18     Temp 03/02/18 0809 98.3 F (36.8 C)     Temp Source 03/02/18 0809 Oral     SpO2 03/02/18 0809 100 %     Weight 03/02/18 0811 200 lb (90.7 kg)     Height 03/02/18 0811 5\' 2"  (1.575 m)     Head Circumference --      Peak Flow --      Pain Score 03/02/18 0811 8     Pain Loc --      Pain Edu? --      Excl. in GC? --    Constitutional: Alert and oriented. Well appearing and in no acute distress. Eyes: Conjunctivae are normal.  Head: Atraumatic. Neck: No stridor.   Cardiovascular: Normal rate, regular rhythm. Grossly normal heart sounds.  Good peripheral circulation. Respiratory: Normal respiratory effort.  No retractions. Lungs CTAB. Gastrointestinal: Soft and nontender. No distention. No abdominal bruits. No CVA tenderness. Musculoskeletal: Moves upper and lower extremities with any difficulty.  Normal gait was noted. Neurologic:  Normal speech and language. No  gross focal neurologic deficits are appreciated.  Skin:  Skin is warm, dry and intact.  Erythematous areas noted on the right upper thigh medial aspect and measures approximately 4 cm across.  There is an area that is questionably an early abscess but no fluctuance is noted in the area.  No drainage present.  No evidence of injury. Psychiatric: Mood and affect are normal. Speech and behavior are normal.  ____________________________________________   LABS (all labs ordered are listed, but only abnormal results are displayed)  Labs Reviewed - No data to display  PROCEDURES  Procedure(s)  performed: None  Procedures  Critical Care performed: No  ____________________________________________   INITIAL IMPRESSION / ASSESSMENT AND PLAN / ED COURSE  As part of my medical decision making, I reviewed the following data within the electronic MEDICAL RECORD NUMBER Notes from prior ED visits and [place] Controlled Substance Database  Patient presents to the ED with questionable area on her right thigh that became worse this morning.  Area is erythematous and tender to touch.  This appears to be an early cellulitis/abscess.  Patient was started on Bactrim DS twice daily for 10 days and Norco as needed for pain.  She is to begin using warm compresses to the area frequently.  She is instructed to return to the ED if any worsening of her symptoms.  She is also aware that she may need to return to the ED in 2 days for I&D should the center area becomes soft.  ____________________________________________   FINAL CLINICAL IMPRESSION(S) / ED DIAGNOSES  Final diagnoses:  Abscess of right thigh     ED Discharge Orders         Ordered    sulfamethoxazole-trimethoprim (BACTRIM DS,SEPTRA DS) 800-160 MG tablet  2 times daily     03/02/18 0922    HYDROcodone-acetaminophen (NORCO/VICODIN) 5-325 MG tablet  Every 6 hours PRN     03/02/18 1610           Note:  This document was prepared using Dragon voice recognition software and may include unintentional dictation errors.    Tommi Rumps, PA-C 03/02/18 1107    Willy Eddy, MD 03/02/18 1256

## 2018-03-02 NOTE — Discharge Instructions (Addendum)
Follow-up with your primary care or return to the ED in 2 days for possible incision and drainage of this area.  Begin taking antibiotics as directed with the Bactrim DS being twice a day for the next 10 days.  Take Norco 1 every 6 hours as needed for pain.  Do not take pain medication if you plan on driving.

## 2018-03-12 ENCOUNTER — Telehealth: Payer: Self-pay | Admitting: Obstetrics and Gynecology

## 2018-03-12 DIAGNOSIS — E282 Polycystic ovarian syndrome: Secondary | ICD-10-CM

## 2018-03-12 HISTORY — DX: Polycystic ovarian syndrome: E28.2

## 2018-03-12 NOTE — Telephone Encounter (Signed)
Advised pt to make an appt to discuss lab work, per [employer].

## 2018-03-12 NOTE — Telephone Encounter (Signed)
The patient is requesting a call from her nurse about her recent lab work and when she needs to start medication, please advise, thanks.

## 2018-03-14 ENCOUNTER — Encounter: Payer: Self-pay | Admitting: Obstetrics and Gynecology

## 2018-03-15 ENCOUNTER — Encounter: Payer: Self-pay | Admitting: Obstetrics and Gynecology

## 2018-03-19 ENCOUNTER — Ambulatory Visit: Payer: Self-pay | Admitting: Obstetrics and Gynecology

## 2018-03-19 ENCOUNTER — Encounter: Payer: Self-pay | Admitting: Obstetrics and Gynecology

## 2018-03-19 ENCOUNTER — Telehealth: Payer: Self-pay | Admitting: Obstetrics and Gynecology

## 2018-03-19 ENCOUNTER — Other Ambulatory Visit: Payer: Self-pay

## 2018-03-19 ENCOUNTER — Telehealth: Payer: Self-pay

## 2018-03-19 VITALS — BP 142/82 | Ht 62.0 in | Wt 207.0 lb

## 2018-03-19 DIAGNOSIS — E8881 Metabolic syndrome: Secondary | ICD-10-CM

## 2018-03-19 DIAGNOSIS — E282 Polycystic ovarian syndrome: Secondary | ICD-10-CM

## 2018-03-19 DIAGNOSIS — E88819 Insulin resistance, unspecified: Secondary | ICD-10-CM

## 2018-03-19 MED ORDER — DESOGESTREL-ETHINYL ESTRADIOL 0.15-0.02/0.01 MG (21/5) PO TABS: 1.0000 | ORAL_TABLET | Freq: Every day | ORAL | 2 refills | Status: DC

## 2018-03-19 MED ORDER — MEDROXYPROGESTERONE ACETATE 10 MG PO TABS
10.0000 mg | ORAL_TABLET | Freq: Every day | ORAL | 0 refills | Status: DC
Start: 2018-03-19 — End: 2018-03-20

## 2018-03-19 NOTE — Progress Notes (Signed)
HPI:      [name] is a 22 y.o. No obstetric history on file. who LMP was Patient's last menstrual period was 11/01/2017 (exact date).  Subjective:   She presents today to discuss lab work for PCO.  She was wondering if she had this diagnosis and what parts of PCO were significant for her.  She is not currently interested in conceiving at this time.    Hx: The following portions of the patient's history were reviewed and updated as appropriate:             She  has a past medical history of Medical history non-contributory and PCOS (polycystic ovarian syndrome). She does not have any pertinent problems on file. She  has a past surgical history that includes Colon surgery; Cholecystectomy; and intestinal surgery at birth. Her family history is not on file. She  reports that she has quit smoking. She has never used smokeless tobacco. She reports that she drinks alcohol. She reports that she does not use drugs. She has a current medication list which includes the following prescription(s): acetaminophen, desogestrel-ethinyl estradiol, hydrocodone-acetaminophen, medroxyprogesterone, and sulfamethoxazole-trimethoprim. She has No Known Allergies.       Review of Systems:  Review of Systems  Constitutional: Denied constitutional symptoms, night sweats, recent illness, fatigue, fever, insomnia and weight loss.  Eyes: Denied eye symptoms, eye pain, photophobia, vision change and visual disturbance.  Ears/Nose/Throat/Neck: Denied ear, nose, throat or neck symptoms, hearing loss, nasal discharge, sinus congestion and sore throat.  Cardiovascular: Denied cardiovascular symptoms, arrhythmia, chest pain/pressure, edema, exercise intolerance, orthopnea and palpitations.  Respiratory: Denied pulmonary symptoms, asthma, pleuritic pain, productive sputum, cough, dyspnea and wheezing.  Gastrointestinal: Denied, gastro-esophageal reflux, melena, nausea and vomiting.  Genitourinary: Denied  genitourinary symptoms including symptomatic vaginal discharge, pelvic relaxation issues, and urinary complaints.  Musculoskeletal: Denied musculoskeletal symptoms, stiffness, swelling, muscle weakness and myalgia.  Dermatologic: Denied dermatology symptoms, rash and scar.  Neurologic: Denied neurology symptoms, dizziness, headache, neck pain and syncope.  Psychiatric: Denied psychiatric symptoms, anxiety and depression.  Endocrine: Denied endocrine symptoms including hot flashes and night sweats.   Meds:   Current Outpatient Medications on File Prior to Visit  Medication Sig Dispense Refill  . acetaminophen (TYLENOL) 500 MG tablet Take 2,000 mg by mouth every 6 (six) hours as needed.    Marland Kitchen HYDROcodone-acetaminophen (NORCO/VICODIN) 5-325 MG tablet Take 1 tablet by mouth every 6 (six) hours as needed for moderate pain. (Patient not taking: Reported on 03/19/2018) 15 tablet 0  . sulfamethoxazole-trimethoprim (BACTRIM DS,SEPTRA DS) 800-160 MG tablet Take 1 tablet by mouth 2 (two) times daily. (Patient not taking: Reported on 03/19/2018) 20 tablet 0   No current facility-administered medications on file prior to visit.     Objective:     Vitals:   03/19/18 1002  BP: (!) 142/82              All lab work for PCO discussed in detail.  Assessment:    No obstetric history on file. Patient Active Problem List   Diagnosis Date Noted  . Pyelonephritis 07/31/2017  . Sepsis (HCC) 04/02/2016     1. PCO (polycystic ovaries)   2. Insulin resistance     Patient has PCO.  She is not interested in fertility at this time.   Plan:            1.  OCPs for PCO  OCPs The risks /benefits of OCPs have been explained to the patient in detail.  Product literature has been given to her.  I have instructed her in the use of OCPs and have given her literature reinforcing this information.  I have explained to the patient that OCPs are not as effective for birth control during the first month of use, and  that another form of contraception should be used during this time.  Both first-day start and Sunday start have been explained.  The risks and benefits of each was discussed.  She has been made aware of  the fact that other medications may affect the efficacy of OCPs.  I have answered all of her questions, and I believe that she has an understanding of the effectiveness and use of OCPs. Strongly advised patient to seek a family physician to follow-up with her insulin resistance. Orders No orders of the defined types were placed in this encounter.    Meds ordered this encounter  Medications  . desogestrel-ethinyl estradiol (MIRCETTE) 0.15-0.02/0.01 MG (21/5) tablet    Sig: Take 1 tablet by mouth at bedtime.    Dispense:  1 Package    Refill:  2  . medroxyPROGESTERone (PROVERA) 10 MG tablet    Sig: Take 1 tablet (10 mg total) by mouth daily.    Dispense:  10 tablet    Refill:  0      F/U  Return in about 3 months (around 06/19/2018). I spent 19 minutes involved in the care of this patient of which greater than 50% was spent discussing PCO and lab work, treatment for PCO, PCO and its effect on infertility, insulin resistance and future diabetes, possible treatment for insulin resistance.  All questions answered.  Elonda Husky, M.D. 03/19/2018 10:36 AM

## 2018-03-19 NOTE — Telephone Encounter (Signed)
Patient called stating her script for today were sent to the wrong pharmacy. They should have been sent to [facility] on [address] in [place].Thanks

## 2018-03-19 NOTE — Telephone Encounter (Signed)
Informed pt that rx had been sent to her parmacy, [facility] on [address].

## 2018-03-19 NOTE — Progress Notes (Signed)
Pt presents today to discuss lab results.

## 2018-03-19 NOTE — Telephone Encounter (Signed)
The patient is at the [facility] now, and is asking if a new script for Sprintec can be sent over due to cost of other med, and also the pharmacy sent over an electronic request by fax, located in front office in dr's box, please advise, thanks.

## 2018-03-20 ENCOUNTER — Other Ambulatory Visit: Payer: Self-pay

## 2018-03-20 ENCOUNTER — Telehealth: Payer: Self-pay

## 2018-03-20 DIAGNOSIS — E282 Polycystic ovarian syndrome: Secondary | ICD-10-CM

## 2018-03-20 MED ORDER — NORGESTIMATE-ETH ESTRADIOL 0.25-35 MG-MCG PO TABS: 1.0000 | ORAL_TABLET | Freq: Every day | ORAL | 0 refills | Status: DC

## 2018-03-20 MED ORDER — MEDROXYPROGESTERONE ACETATE 10 MG PO TABS: 10.0000 mg | ORAL_TABLET | Freq: Every day | ORAL | 0 refills | Status: DC

## 2018-03-20 NOTE — Telephone Encounter (Signed)
Pt can not afford mircette. She needs a cheaper pill.  Pt aware per DJE sprintec as been e rxed.

## 2018-07-03 ENCOUNTER — Encounter: Payer: Self-pay | Admitting: Obstetrics and Gynecology

## 2018-07-19 ENCOUNTER — Encounter: Payer: Self-pay | Admitting: Obstetrics and Gynecology

## 2018-08-06 ENCOUNTER — Emergency Department: Payer: Self-pay

## 2018-08-06 ENCOUNTER — Emergency Department
Admission: EM | Admit: 2018-08-06 | Discharge: 2018-08-06 | Disposition: A | Discharge status: Home / Self Care | Payer: Self-pay | Attending: Student in an Organized Health Care Education/Training Program | Admitting: Student in an Organized Health Care Education/Training Program

## 2018-08-06 ENCOUNTER — Encounter: Payer: Self-pay | Admitting: Emergency Medicine

## 2018-08-06 ENCOUNTER — Other Ambulatory Visit: Payer: Self-pay

## 2018-08-06 DIAGNOSIS — M79672 Pain in left foot: Secondary | ICD-10-CM

## 2018-08-06 DIAGNOSIS — Z79899 Other long term (current) drug therapy: Secondary | ICD-10-CM | POA: Insufficient documentation

## 2018-08-06 DIAGNOSIS — M109 Gout, unspecified: Secondary | ICD-10-CM | POA: Insufficient documentation

## 2018-08-06 LAB — URIC ACID: Uric Acid, Serum: 10.8 mg/dL — ABNORMAL HIGH (ref 2.5–7.1)

## 2018-08-06 MED ORDER — KETOROLAC TROMETHAMINE 30 MG/ML IJ SOLN
30.0000 mg | Freq: Once | INTRAMUSCULAR | Status: AC
  Administered 2018-08-06: 30 mg via INTRAMUSCULAR
  Filled 2018-08-06: qty 1

## 2018-08-06 MED ORDER — INDOMETHACIN 25 MG PO CAPS: 25.0000 mg | ORAL_CAPSULE | Freq: Three times a day (TID) | ORAL | 0 refills | Status: DC

## 2018-08-06 MED ORDER — TRAMADOL HCL 50 MG PO TABS: 50.0000 mg | ORAL_TABLET | Freq: Four times a day (QID) | ORAL | 0 refills | Status: DC | PRN

## 2018-08-06 NOTE — Discharge Instructions (Signed)
Follow-up with your primary care provider or doctor of your choice.  Also other options are [facility], [facility], [facility] or [facility] acute care.  Begin taking indomethacin 25 mg 3 times daily with food.  Do not take this medication on an empty stomach.  Also discontinue taking this medication if there is any stomach upset.  Tramadol is for pain only and should be taken if you are not driving or operating machinery.  Read the information about a low purine eating plan as this may help with your uric acid.

## 2018-08-06 NOTE — ED Triage Notes (Signed)
Pt to triage via w/c with no distress noted; Pt st swelling and pain to foot, nonradiating; denies any known injury

## 2018-08-06 NOTE — ED Provider Notes (Signed)
[facility] Emergency Department Provider Note  ____________________________________________   First MD Initiated Contact with Patient 08/06/18 [phone]     (approximate)  I have reviewed the triage vital signs and the nursing notes.   HISTORY  Chief Complaint Foot Pain   HPI Latoya Benson is a 23 y.o. female presents to the ED with complaint of left foot pain for several days.  Patient denies any history of injury.  She states that this happened to her in the past and was told that it was "a bruised bone.   Patient states that this happens to her left foot without history of injury and then after several days of severe pain goes away without medication.  She denies any previous fractures to her foot.  Rates her pain as a 10/10.   Past Medical History:  Diagnosis Date  . Medical history non-contributory   . PCOS (polycystic ovarian syndrome)     Patient Active Problem List   Diagnosis Date Noted  . Pyelonephritis 07/31/2017  . Sepsis (HCC) 04/02/2016    Past Surgical History:  Procedure Laterality Date  . CHOLECYSTECTOMY    . COLON SURGERY    . 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 Authorizing Provider  acetaminophen (TYLENOL) 500 MG tablet Take 2,000 mg by mouth every 6 (six) hours as needed.    [provider]  desogestrel-ethinyl estradiol (MIRCETTE) 0.15-0.02/0.01 MG (21/5) tablet Take 1 tablet by mouth at bedtime. 03/19/18   Linzie Collin, MD  indomethacin (INDOCIN) 25 MG capsule Take 1 capsule (25 mg total) by mouth 3 (three) times daily with meals. 08/06/18   Tommi Rumps, PA-C  medroxyPROGESTERone (PROVERA) 10 MG tablet Take 1 tablet (10 mg total) by mouth daily for 10 days. 03/20/18 03/30/18  Linzie Collin, MD  norgestimate-ethinyl estradiol (SPRINTEC 28) 0.25-35 MG-MCG tablet Take 1 tablet by mouth daily. 03/20/18   Linzie Collin, MD    traMADol (ULTRAM) 50 MG tablet Take 1 tablet (50 mg total) by mouth every 6 (six) hours as needed. 08/06/18   Tommi Rumps, PA-C    Allergies Patient has no known allergies.  Family History  Problem Relation Age of Onset  . Diabetes Neg Hx     Social History Social History   Tobacco Use  . Smoking status: Former Games developer  . Smokeless tobacco: Never Used  Substance Use Topics  . Alcohol use: Yes  . Drug use: No    Review of Systems Constitutional: No fever/chills Cardiovascular: Denies chest pain. Respiratory: Denies shortness of breath. Musculoskeletal: Positive acute left foot pain. Skin: Negative for rash. Neurological: Negative for  focal weakness or numbness. ____________________________________________   PHYSICAL EXAM:  VITAL SIGNS: ED Triage Vitals  Enc Vitals Group     BP 08/06/18 0619 126/73     Pulse Rate 08/06/18 0619 (!) 116     Resp 08/06/18 0619 18     Temp 08/06/18 0619 98 F (36.7 C)     Temp Source 08/06/18 0619 Oral     SpO2 08/06/18 0619 98 %     Weight 08/06/18 0618 210 lb (95.3 kg)     Height 08/06/18 0618 5\' 2"  (1.575 m)     Head Circumference --      Peak Flow --      Pain Score 08/06/18 0618 10     Pain Loc --  Pain Edu? --      Excl. in GC? --    Constitutional: Alert and oriented. Well appearing and in no acute distress. Eyes: Conjunctivae are normal.  Head: Atraumatic. Neck: No stridor.   Cardiovascular: Normal rate, regular rhythm. Grossly normal heart sounds.  Good peripheral circulation. Respiratory: Normal respiratory effort.  No retractions. Lungs CTAB. Musculoskeletal: Examination of the left foot there is moderate soft tissue tenderness with minimal edema.  The left ankle is markedly tender to touch and patient is restricted to range of motion due to her pain.  Pulses present.  Skin is intact.  No point tenderness on palpation of the Achilles tendon. Neurologic:  Normal speech and language. No gross focal neurologic  deficits are appreciated.  Skin:  Skin is warm, dry and intact.  Psychiatric: Mood and affect are normal. Speech and behavior are normal.  ____________________________________________   LABS (all labs ordered are listed, but only abnormal results are displayed)  Labs Reviewed  URIC ACID - Abnormal; Notable for the following components:      Result Value   Uric Acid, Serum 10.8 (*)    All other components within normal limits    RADIOLOGY  ED MD interpretation:   Left foot is negative for acute bony injury.  Official radiology report(s): Dg Foot Complete Left  Result Date: 08/06/2018 CLINICAL DATA:  Pain and swelling EXAM: LEFT FOOT - COMPLETE 3+ VIEW COMPARISON:  January 27, 2017 FINDINGS: Frontal, oblique, and lateral views obtained. There is mild soft tissue swelling. No fracture or dislocation. Joint spaces appear normal. No erosive change. IMPRESSION: Mild soft tissue swelling. No fracture or dislocation. No evident arthropathy. Electronically Signed   By: Bretta BangWilliam  Woodruff III M.D.   On: 08/06/2018 07:28    ____________________________________________   PROCEDURES  Procedure(s) performed (including Critical Care):  Procedures   ____________________________________________   INITIAL IMPRESSION / ASSESSMENT AND PLAN / ED COURSE  As part of my medical decision making, I reviewed the following data within the electronic MEDICAL RECORD NUMBER Notes from prior ED visits and [place] Controlled Substance Database  Patient presents to the ED with complaint of sudden onset of left foot pain without history of injury.  Patient gives a history that this is occurred several times and that it eventually clears.  She denies any known problems with uric acid.  X-rays were negative for acute bony injury.  Uric acid level was 10.8.  Patient was given Toradol while in the ED.  We discussed uric acid and she is to follow-up with her PCP.  She was discharged with prescription for indomethacin 25 mg  3 times daily with food.  She is aware that she needs to discontinue this medication if she gets any stomach upset.  Tramadol 50 mg every 6 hours as needed for pain.  She was also given a list of foods to try to avoid to help with her uric acid.  ____________________________________________   FINAL CLINICAL IMPRESSION(S) / ED DIAGNOSES  Final diagnoses:  Acute foot pain, left  Acute gout of left ankle, unspecified cause     ED Discharge Orders         Ordered    indomethacin (INDOCIN) 25 MG capsule  3 times daily with meals     08/06/18 0835    traMADol (ULTRAM) 50 MG tablet  Every 6 hours PRN     08/06/18 0835           Note:  This document was prepared using Dragon voice  recognition software and may include unintentional dictation errors.    Tommi Rumps, PA-C 08/06/18 1236    Willy Eddy, MD 08/06/18 1430

## 2018-08-06 NOTE — ED Notes (Signed)
See triage note  Presents with pain to top of left foot for couple of days   Denies any injury  States pain is mainly top/lateral aspect of foot  Min swelling noted  Good pulses   Also states she is not able to bear wt

## 2018-08-13 ENCOUNTER — Ambulatory Visit: Payer: Self-pay

## 2018-08-15 ENCOUNTER — Other Ambulatory Visit: Payer: Self-pay

## 2018-08-15 ENCOUNTER — Ambulatory Visit: Payer: Self-pay | Admitting: Adult Health Nurse Practitioner

## 2018-08-15 VITALS — BP 144/100 | HR 112 | Temp 98.4°F | Ht 60.24 in | Wt 208.5 lb

## 2018-08-15 DIAGNOSIS — M109 Gout, unspecified: Secondary | ICD-10-CM

## 2018-08-15 DIAGNOSIS — R7303 Prediabetes: Secondary | ICD-10-CM

## 2018-08-15 MED ORDER — COLCHICINE 0.6 MG PO TABS: ORAL_TABLET | ORAL | 0 refills | Status: DC

## 2018-08-15 MED ORDER — ALLOPURINOL 100 MG PO TABS: 100.0000 mg | ORAL_TABLET | Freq: Every day | ORAL | 0 refills | Status: DC

## 2018-08-15 NOTE — Progress Notes (Signed)
  Patient: Latoya Benson Female    DOB: 01/22/1996   23 y.o.   MRN: [id number] Visit Date: 08/15/2018  Today's Provider: Jacelyn Pi, NP   Chief Complaint  Patient presents with  . Gout    left ankle    Subjective:    HPI   States she would like OCP due to her PCOS and she is trying to get pregnant.   Went to hospital on 2.25 with foot pain and was diagnosed with gout- was given Indomethacin with tramadol for pain. She states that the symptoms subsided for a day then they returned and she can barely walk. Recent xray was negative.    Takes no medications regularly.   Pre-DM.   No Known Allergies Previous Medications   ACETAMINOPHEN (TYLENOL) 500 MG TABLET    Take 2,000 mg by mouth every 6 (six) hours as needed.   DESOGESTREL-ETHINYL ESTRADIOL (MIRCETTE) 0.15-0.02/0.01 MG (21/5) TABLET    Take 1 tablet by mouth at bedtime.   INDOMETHACIN (INDOCIN) 25 MG CAPSULE    Take 1 capsule (25 mg total) by mouth 3 (three) times daily with meals.   MEDROXYPROGESTERONE (PROVERA) 10 MG TABLET    Take 1 tablet (10 mg total) by mouth daily for 10 days.   NORGESTIMATE-ETHINYL ESTRADIOL (SPRINTEC 28) 0.25-35 MG-MCG TABLET    Take 1 tablet by mouth daily.   TRAMADOL (ULTRAM) 50 MG TABLET    Take 1 tablet (50 mg total) by mouth every 6 (six) hours as needed.    Review of Systems  All other systems reviewed and are negative.   Social History   Tobacco Use  . Smoking status: Former Games developer  . Smokeless tobacco: Never Used  Substance Use Topics  . Alcohol use: Yes   Objective:   BP (!) 144/100 (BP Location: Left Arm, Patient Position: Sitting, Cuff Size: Normal) Comment: manual  Pulse (!) 112   Temp 98.4 F (36.9 C)   Ht 5' 0.24" (1.53 m)   Wt 208 lb 8 oz (94.6 kg)   LMP 06/22/2018 Comment: PCOS  BMI 40.40 kg/m   Physical Exam Vitals signs reviewed.  Constitutional:      Appearance: Normal appearance.  HENT:     Head: Normocephalic and atraumatic.  Neck:   Musculoskeletal: Normal range of motion and neck supple.  Cardiovascular:     Rate and Rhythm: Regular rhythm. Tachycardia present.     Pulses: Normal pulses.     Heart sounds: Normal heart sounds.  Pulmonary:     Effort: Pulmonary effort is normal.     Breath sounds: Normal breath sounds.  Abdominal:     General: Bowel sounds are normal.     Palpations: Abdomen is soft.  Skin:    Comments: LLE tender to touch, non-pitting edema.   Neurological:     Mental Status: She is alert.         Assessment & Plan:        FU in 2 weeks and reassess BP and review labs.  [facility]   Gout: Colchicine tomorrow as directed.  Allopurinol daily.  If not improved may FU sooner.   Routine labs.    Jacelyn Pi, NP   [facility]

## 2018-08-15 NOTE — Patient Instructions (Signed)

## 2018-08-16 LAB — COMPREHENSIVE METABOLIC PANEL
ALT: 24 IU/L (ref 0–32)
AST: 17 IU/L (ref 0–40)
Albumin/Globulin Ratio: 1.5 (ref 1.2–2.2)
Albumin: 4.8 g/dL (ref 3.9–5.0)
Alkaline Phosphatase: 60 IU/L (ref 39–117)
BUN/Creatinine Ratio: 12 (ref 9–23)
BUN: 10 mg/dL (ref 6–20)
Bilirubin Total: 0.2 mg/dL (ref 0.0–1.2)
CO2: 21 mmol/L (ref 20–29)
CREATININE: 0.86 mg/dL (ref 0.57–1.00)
Calcium: 10.5 mg/dL — ABNORMAL HIGH (ref 8.7–10.2)
Chloride: 104 mmol/L (ref 96–106)
GFR calc Af Amer: 109 mL/min/{1.73_m2} (ref >59)
GFR calc non Af Amer: 95 mL/min/{1.73_m2} (ref >59)
Globulin, Total: 3.2 g/dL (ref 1.5–4.5)
Glucose: 97 mg/dL (ref 65–99)
Potassium: 5 mmol/L (ref 3.5–5.2)
Sodium: 143 mmol/L (ref 134–144)
Total Protein: 8 g/dL (ref 6.0–8.5)

## 2018-08-16 LAB — HEMOGLOBIN A1C
Est. average glucose Bld gHb Est-mCnc: 123 mg/dL
Hgb A1c MFr Bld: 5.9 % — ABNORMAL HIGH (ref 4.8–5.6)

## 2018-08-16 LAB — CBC
Hematocrit: 35.3 % (ref 34.0–46.6)
Hemoglobin: 12.4 g/dL (ref 11.1–15.9)
MCH: 30.1 pg (ref 26.6–33.0)
MCHC: 35.1 g/dL (ref 31.5–35.7)
MCV: 86 fL (ref 79–97)
Platelets: 546 10*3/uL — ABNORMAL HIGH (ref 150–450)
RBC: 4.12 x10E6/uL (ref 3.77–5.28)
RDW: 12.9 % (ref 11.7–15.4)
WBC: 10.7 10*3/uL (ref 3.4–10.8)

## 2018-08-24 ENCOUNTER — Encounter: Payer: Self-pay | Admitting: Emergency Medicine

## 2018-08-24 ENCOUNTER — Emergency Department: Payer: Self-pay

## 2018-08-24 ENCOUNTER — Other Ambulatory Visit: Payer: Self-pay

## 2018-08-24 ENCOUNTER — Emergency Department
Admission: EM | Admit: 2018-08-24 | Discharge: 2018-08-24 | Disposition: A | Discharge status: Home / Self Care | Payer: Self-pay | Attending: Emergency Medicine | Admitting: Emergency Medicine

## 2018-08-24 DIAGNOSIS — N3090 Cystitis, unspecified without hematuria: Secondary | ICD-10-CM | POA: Insufficient documentation

## 2018-08-24 DIAGNOSIS — Z3202 Encounter for pregnancy test, result negative: Secondary | ICD-10-CM | POA: Insufficient documentation

## 2018-08-24 DIAGNOSIS — Z87891 Personal history of nicotine dependence: Secondary | ICD-10-CM | POA: Insufficient documentation

## 2018-08-24 LAB — URINALYSIS, COMPLETE (UACMP) WITH MICROSCOPIC
Bilirubin Urine: NEGATIVE
GLUCOSE, UA: NEGATIVE mg/dL
Ketones, ur: NEGATIVE mg/dL
NITRITE: NEGATIVE
Protein, ur: 30 mg/dL — AB
RBC / HPF: >50 RBC/hpf — ABNORMAL HIGH (ref 0–5)
Specific Gravity, Urine: 1.012 (ref 1.005–1.030)
WBC, UA: >50 WBC/hpf — ABNORMAL HIGH (ref 0–5)
pH: 5 (ref 5.0–8.0)

## 2018-08-24 LAB — CBC WITH DIFFERENTIAL/PLATELET
Abs Immature Granulocytes: 0.09 10*3/uL — ABNORMAL HIGH (ref 0.00–0.07)
Basophils Absolute: 0.1 10*3/uL (ref 0.0–0.1)
Basophils Relative: 1 %
Eosinophils Absolute: 0 10*3/uL (ref 0.0–0.5)
Eosinophils Relative: 0 %
HCT: 36.8 % (ref 36.0–46.0)
Hemoglobin: 11.7 g/dL — ABNORMAL LOW (ref 12.0–15.0)
Immature Granulocytes: 1 %
Lymphocytes Relative: 9 %
Lymphs Abs: 1.4 10*3/uL (ref 0.7–4.0)
MCH: 29.5 pg (ref 26.0–34.0)
MCHC: 31.8 g/dL (ref 30.0–36.0)
MCV: 92.9 fL (ref 80.0–100.0)
Monocytes Absolute: 1.3 10*3/uL — ABNORMAL HIGH (ref 0.1–1.0)
Monocytes Relative: 9 %
Neutro Abs: 12.7 10*3/uL — ABNORMAL HIGH (ref 1.7–7.7)
Neutrophils Relative %: 80 %
Platelets: 452 10*3/uL — ABNORMAL HIGH (ref 150–400)
RBC: 3.96 MIL/uL (ref 3.87–5.11)
RDW: 14 % (ref 11.5–15.5)
WBC: 15.5 10*3/uL — ABNORMAL HIGH (ref 4.0–10.5)
nRBC: 0 % (ref 0.0–0.2)

## 2018-08-24 LAB — BASIC METABOLIC PANEL
Anion gap: 12 (ref 5–15)
BUN: 9 mg/dL (ref 6–20)
CO2: 22 mmol/L (ref 22–32)
CREATININE: 1.05 mg/dL — AB (ref 0.44–1.00)
Calcium: 8.9 mg/dL (ref 8.9–10.3)
Chloride: 103 mmol/L (ref 98–111)
GFR calc non Af Amer: >60 mL/min (ref >60)
Glucose, Bld: 116 mg/dL — ABNORMAL HIGH (ref 70–99)
Potassium: 3.4 mmol/L — ABNORMAL LOW (ref 3.5–5.1)
Sodium: 137 mmol/L (ref 135–145)

## 2018-08-24 LAB — POCT PREGNANCY, URINE: Preg Test, Ur: NEGATIVE

## 2018-08-24 LAB — INFLUENZA PANEL BY PCR (TYPE A & B)
Influenza A By PCR: NEGATIVE
Influenza B By PCR: NEGATIVE

## 2018-08-24 MED ORDER — KETOROLAC TROMETHAMINE 30 MG/ML IJ SOLN
15.0000 mg | Freq: Once | INTRAMUSCULAR | Status: AC
  Administered 2018-08-24: 15 mg via INTRAVENOUS
  Filled 2018-08-24: qty 1

## 2018-08-24 MED ORDER — NAPROXEN 500 MG PO TABS: 500.0000 mg | ORAL_TABLET | Freq: Two times a day (BID) | ORAL | 0 refills | Status: DC

## 2018-08-24 MED ORDER — ONDANSETRON 4 MG PO TBDP: 4.0000 mg | ORAL_TABLET | Freq: Three times a day (TID) | ORAL | 0 refills | Status: DC | PRN

## 2018-08-24 MED ORDER — SODIUM CHLORIDE 0.9 % IV BOLUS
1000.0000 mL | Freq: Once | INTRAVENOUS | Status: AC
  Administered 2018-08-24: 1000 mL via INTRAVENOUS

## 2018-08-24 MED ORDER — ACETAMINOPHEN 325 MG PO TABS
650.0000 mg | ORAL_TABLET | Freq: Once | ORAL | Status: AC | PRN
  Administered 2018-08-24: 650 mg via ORAL
  Filled 2018-08-24: qty 2

## 2018-08-24 MED ORDER — CEFDINIR 300 MG PO CAPS: 300.0000 mg | ORAL_CAPSULE | Freq: Two times a day (BID) | ORAL | 0 refills | Status: DC

## 2018-08-24 MED ORDER — SODIUM CHLORIDE 0.9 % IV SOLN
1.0000 g | Freq: Once | INTRAVENOUS | Status: AC
  Administered 2018-08-24: 1 g via INTRAVENOUS
  Filled 2018-08-24: qty 10

## 2018-08-24 NOTE — ED Triage Notes (Signed)
Pt to ED via POV c/o fever and generalized body aches. Pt states "I think I have a kidney infection". Pt denies dysuria. Pt states that her urine is orange. Pt is in NAD.

## 2018-08-24 NOTE — ED Provider Notes (Signed)
[facility] Emergency Department Provider Note  ____________________________________________  Time seen: Approximately 10:38 PM  I have reviewed the triage vital signs and the nursing notes.   HISTORY  Chief Complaint Fever and Generalized Body Aches    HPI Latoya Benson is a 23 y.o. female with a past medical history of PCOS and pyelonephritis who comes the ED complaining of generalized body aches and fever.  Feels like kidney infections that she has had in the past although the symptoms are not quite as severe as they were before on previous episodes.  Noticed that her urine was discolored and orange today.  Denies dysuria frequency urgency.  Denies chest pain shortness of breath cough.  Symptoms are waxing and waning, no aggravating or alleviating factors, moderate severity      Past Medical History:  Diagnosis Date  . Medical history non-contributory   . PCOS (polycystic ovarian syndrome)   . PCOS (polycystic ovarian syndrome)   . PCOS (polycystic ovarian syndrome) 03/2018     Patient Active Problem List   Diagnosis Date Noted  . Pyelonephritis 07/31/2017  . Sepsis (HCC) 04/02/2016     Past Surgical History:  Procedure Laterality Date  . CHOLECYSTECTOMY  05/22/2013  . COLON SURGERY  day after birth  . intestinal surgery at birth       Prior to Admission medications   Medication Sig Start Date End Date Taking? Authorizing Provider  acetaminophen (TYLENOL) 500 MG tablet Take 2,000 mg by mouth every 6 (six) hours as needed.    [provider]  allopurinol (ZYLOPRIM) 100 MG tablet Take 1 tablet (100 mg total) by mouth daily. 08/15/18   Doles-Johnson, Teah, NP  cefdinir (OMNICEF) 300 MG capsule Take 1 capsule (300 mg total) by mouth 2 (two) times daily. 08/24/18   Sharman Cheek, MD  colchicine 0.6 MG tablet Take 2 tablet at once then 1 additional tablet an hour later 08/15/18   Doles-Johnson, Teah, NP  desogestrel-ethinyl estradiol  (MIRCETTE) 0.15-0.02/0.01 MG (21/5) tablet Take 1 tablet by mouth at bedtime. 03/19/18   Linzie Collin, MD  indomethacin (INDOCIN) 25 MG capsule Take 1 capsule (25 mg total) by mouth 3 (three) times daily with meals. Patient not taking: Reported on 08/15/2018 08/06/18   Tommi Rumps, PA-C  medroxyPROGESTERone (PROVERA) 10 MG tablet Take 1 tablet (10 mg total) by mouth daily for 10 days. 03/20/18 03/30/18  Linzie Collin, MD  naproxen (NAPROSYN) 500 MG tablet Take 1 tablet (500 mg total) by mouth 2 (two) times daily with a meal. 08/24/18   Sharman Cheek, MD  norgestimate-ethinyl estradiol (SPRINTEC 28) 0.25-35 MG-MCG tablet Take 1 tablet by mouth daily. Patient not taking: Reported on 08/15/2018 03/20/18   Linzie Collin, MD  ondansetron (ZOFRAN ODT) 4 MG disintegrating tablet Take 1 tablet (4 mg total) by mouth every 8 (eight) hours as needed for nausea or vomiting. 08/24/18   Sharman Cheek, MD  traMADol (ULTRAM) 50 MG tablet Take 1 tablet (50 mg total) by mouth every 6 (six) hours as needed. Patient not taking: Reported on 08/15/2018 08/06/18   Tommi Rumps, PA-C     Allergies Patient has no known allergies.   Family History  Problem Relation Age of Onset  . Diabetes Mother   . COPD Father   . Hypertension Father     Social History Social History   Tobacco Use  . Smoking status: Former Games developer  . Smokeless tobacco: Never Used  Substance Use Topics  .  Alcohol use: Yes  . Drug use: No    Review of Systems  Constitutional:   Positive fever without chills.  ENT:   No sore throat. No rhinorrhea. Cardiovascular:   No chest pain or syncope. Respiratory:   No dyspnea or cough. Gastrointestinal:   Negative for abdominal pain, vomiting and diarrhea.  Musculoskeletal:   Negative for focal pain or swelling All other systems reviewed and are negative except as documented above in ROS and HPI.  ____________________________________________   PHYSICAL EXAM:  VITAL  SIGNS: ED Triage Vitals  Enc Vitals Group     BP 08/24/18 1649 99/78     Pulse Rate 08/24/18 1649 (!) 141     Resp 08/24/18 1649 16     Temp 08/24/18 1649 (!) 101.2 F (38.4 C)     Temp Source 08/24/18 1649 Oral     SpO2 08/24/18 1649 97 %     Weight 08/24/18 1652 208 lb (94.3 kg)     Height 08/24/18 1652  (1.575 m)     Head Circumference --      Peak Flow --      Pain Score 08/24/18 1651 10     Pain Loc --      Pain Edu? --      Excl. in GC? --     Vital signs reviewed, nursing assessments reviewed.   Constitutional:   Alert and oriented. Non-toxic appearance. Eyes:   Conjunctivae are normal. EOMI. PERRL. ENT      Head:   Normocephalic and atraumatic.      Nose:   No congestion/rhinnorhea.       Mouth/Throat:   MMM, no pharyngeal erythema. No peritonsillar mass.       Neck:   No meningismus. Full ROM. Hematological/Lymphatic/Immunilogical:   No cervical lymphadenopathy. Cardiovascular:   Tachycardia heart rate 105. Symmetric bilateral radial and DP pulses.  No murmurs. Cap refill less than 2 seconds. Respiratory:   Normal respiratory effort without tachypnea/retractions. Breath sounds are clear and equal bilaterally. No wheezes/rales/rhonchi. Gastrointestinal:   Soft and nontender. Non distended. There is no CVA tenderness.  No rebound, rigidity, or guarding. Musculoskeletal:   Normal range of motion in all extremities. No joint effusions.  No lower extremity tenderness.  No edema. Neurologic:   Normal speech and language.  Motor grossly intact. No acute focal neurologic deficits are appreciated.  Skin:    Skin is warm, dry and intact. No rash noted.  No petechiae, purpura, or bullae.  ____________________________________________    LABS (pertinent positives/negatives) (all labs ordered are listed, but only abnormal results are displayed) Labs Reviewed  CBC WITH DIFFERENTIAL/PLATELET - Abnormal; Notable for the following components:      Result Value   WBC 15.5  (*)    Hemoglobin 11.7 (*)    Platelets 452 (*)    Neutro Abs 12.7 (*)    Monocytes Absolute 1.3 (*)    Abs Immature Granulocytes 0.09 (*)    All other components within normal limits  BASIC METABOLIC PANEL - Abnormal; Notable for the following components:   Potassium 3.4 (*)    Glucose, Bld 116 (*)    Creatinine, Ser 1.05 (*)    All other components within normal limits  URINALYSIS, COMPLETE (UACMP) WITH MICROSCOPIC - Abnormal; Notable for the following components:   Color, Urine YELLOW (*)    APPearance CLOUDY (*)    Hgb urine dipstick LARGE (*)    Protein, ur 30 (*)    Leukocytes,Ua SMALL (*)  RBC / HPF >50 (*)    WBC, UA >50 (*)    Bacteria, UA RARE (*)    All other components within normal limits  URINE CULTURE  INFLUENZA PANEL BY PCR (TYPE A & B)  POCT PREGNANCY, URINE  POC URINE PREG, ED   ____________________________________________   EKG    ____________________________________________    RADIOLOGY  Dg Chest 2 View  Result Date: 08/24/2018 CLINICAL DATA:  Fever EXAM: CHEST - 2 VIEW COMPARISON:  April 02, 2016 FINDINGS: There is no edema or consolidation. The heart size and pulmonary vascularity are normal. No adenopathy. No bone lesions. IMPRESSION: No edema or consolidation. Electronically Signed   By: Bretta Bang III M.D.   On: 08/24/2018 17:55    ____________________________________________   PROCEDURES Procedures  ____________________________________________    CLINICAL IMPRESSION / ASSESSMENT AND PLAN / ED COURSE  Medications ordered in the ED: Medications  acetaminophen (TYLENOL) tablet 650 mg (650 mg Oral Given 08/24/18 1705)  ketorolac (TORADOL) 30 MG/ML injection 15 mg (15 mg Intravenous Given 08/24/18 1721)  sodium chloride 0.9 % bolus 1,000 mL (0 mLs Intravenous Stopped 08/24/18 2023)  cefTRIAXone (ROCEPHIN) 1 g in sodium chloride 0.9 % 100 mL IVPB (0 g Intravenous Stopped 08/24/18 2117)  sodium chloride 0.9 % bolus 1,000 mL (0  mLs Intravenous Stopped 08/24/18 2144)    Pertinent labs & imaging results that were available during my care of the patient were reviewed by me and considered in my medical decision making (see chart for details).    Patient presents with fever and tachycardia.  Considered differential including hyperthyroidism, cystitis pyelonephritis influenza viral syndrome pneumonia, among others.  Work-up does reveal a clear urinary tract infection.  She has a leukocytosis of 15,000, neutrophil predominant.  Consistent with a urinary tract infection.  Exam is not consistent with pyelonephritis.  Given her history, patient given a gram of ceftriaxone in the ED, and I will continue her on Omnicef at home.  Return precautions for worsening symptoms although I think that she is relatively young and healthy and is suitable for outpatient follow-up.      ____________________________________________   FINAL CLINICAL IMPRESSION(S) / ED DIAGNOSES    Final diagnoses:  Cystitis     ED Discharge Orders         Ordered    naproxen (NAPROSYN) 500 MG tablet  2 times daily with meals     08/24/18 2237    cefdinir (OMNICEF) 300 MG capsule  2 times daily     08/24/18 2237    ondansetron (ZOFRAN ODT) 4 MG disintegrating tablet  Every 8 hours PRN     08/24/18 2237          Portions of this note were generated with dragon dictation software. Dictation errors may occur despite best attempts at proofreading.   Sharman Cheek, MD 08/24/18 2240

## 2018-08-24 NOTE — ED Notes (Signed)
Patient requested that fluids be stopped and IV removed due to discomfort at IV site. IV is patent, no swelling or redness noted.

## 2018-08-26 LAB — URINE CULTURE

## 2018-08-29 ENCOUNTER — Other Ambulatory Visit: Payer: Self-pay

## 2018-08-29 ENCOUNTER — Ambulatory Visit: Payer: Self-pay | Admitting: Urology

## 2018-08-29 ENCOUNTER — Other Ambulatory Visit: Payer: Self-pay | Admitting: Urology

## 2018-08-29 VITALS — BP 124/90 | Temp 97.4°F | Ht 61.02 in | Wt 204.9 lb

## 2018-08-29 DIAGNOSIS — M109 Gout, unspecified: Secondary | ICD-10-CM

## 2018-08-29 DIAGNOSIS — Z0000 Encounter for general adult medical examination without abnormal findings: Secondary | ICD-10-CM

## 2018-08-29 MED ORDER — ALLOPURINOL 100 MG PO TABS: 100.0000 mg | ORAL_TABLET | Freq: Two times a day (BID) | ORAL | 0 refills | Status: DC

## 2018-08-29 NOTE — Progress Notes (Signed)
Patient: Latoya Benson Female    DOB: 01-23-1996   23 y.o.   MRN: [id number] Visit Date: 08/29/2018  Today's Provider: Michiel Cowboy, PA-C   Chief Complaint  Patient presents with  . Follow-up    gout in left foot   Subjective:    HPI Patient is currently taking 100 mg of Allopurinol and states that she has been on 200 mg in the past and found it more effective.    She states over the weekend she started having sharp shooting pains that start from the right inguinal area through the top of the thigh to the inner thigh to her medial ankle.  The pain lasts for a couple seconds and then she moves her leg and the pain goes away.    She has not had the pain since Tuesday.     No Known Allergies Previous Medications   ACETAMINOPHEN (TYLENOL) 500 MG TABLET    Take 2,000 mg by mouth every 6 (six) hours as needed.   ALLOPURINOL (ZYLOPRIM) 100 MG TABLET    Take 1 tablet (100 mg total) by mouth daily.   CEFDINIR (OMNICEF) 300 MG CAPSULE    Take 1 capsule (300 mg total) by mouth 2 (two) times daily.   COLCHICINE 0.6 MG TABLET    Take 2 tablet at once then 1 additional tablet an hour later   DESOGESTREL-ETHINYL ESTRADIOL (MIRCETTE) 0.15-0.02/0.01 MG (21/5) TABLET    Take 1 tablet by mouth at bedtime.   INDOMETHACIN (INDOCIN) 25 MG CAPSULE    Take 1 capsule (25 mg total) by mouth 3 (three) times daily with meals.   MEDROXYPROGESTERONE (PROVERA) 10 MG TABLET    Take 1 tablet (10 mg total) by mouth daily for 10 days.   NAPROXEN (NAPROSYN) 500 MG TABLET    Take 1 tablet (500 mg total) by mouth 2 (two) times daily with a meal.   NORGESTIMATE-ETHINYL ESTRADIOL (SPRINTEC 28) 0.25-35 MG-MCG TABLET    Take 1 tablet by mouth daily.   ONDANSETRON (ZOFRAN ODT) 4 MG DISINTEGRATING TABLET    Take 1 tablet (4 mg total) by mouth every 8 (eight) hours as needed for nausea or vomiting.   TRAMADOL (ULTRAM) 50 MG TABLET    Take 1 tablet (50 mg total) by mouth every 6 (six) hours as needed.    Review of  Systems  Musculoskeletal: Positive for arthralgias.  All other systems reviewed and are negative.   Social History   Tobacco Use  . Smoking status: Former Games developer  . Smokeless tobacco: Never Used  Substance Use Topics  . Alcohol use: Yes   Objective:   BP 124/90 (BP Location: Left Arm, Patient Position: Sitting, Cuff Size: Large)   Temp (!) 97.4 F (36.3 C) (Oral)   Ht 5' 1.02" (1.55 m)   Wt 204 lb 14.4 oz (92.9 kg)   BMI 38.69 kg/m   Physical Exam Vitals signs reviewed.  Constitutional:      Appearance: Normal appearance. She is obese.  HENT:     Head: Normocephalic and atraumatic.     Nose: Nose normal.  Eyes:     Conjunctiva/sclera: Conjunctivae normal.  Neck:     Musculoskeletal: Normal range of motion.  Pulmonary:     Effort: Pulmonary effort is normal.  Neurological:     Mental Status: She is alert.  Psychiatric:        Mood and Affect: Mood normal.        Behavior: Behavior normal.  Thought Content: Thought content normal.        Judgment: Judgment normal.         Assessment & Plan:    1. Gout  Increase allopurinol to 200 mg daily  Recheck uric acid levels in 6 weeks  Also needs to have CMP, CBC, TSH, lipids and Hbg A1c    2. L5 pars defect   Seen on CT in 2017  Will schedule xrays once COVID-19 restrictions lift or the pain worsens       Michiel Cowboy, PA-C   [facility]

## 2018-09-19 ENCOUNTER — Ambulatory Visit: Payer: Self-pay | Admitting: Licensed Clinical Social Worker

## 2018-09-19 ENCOUNTER — Other Ambulatory Visit: Payer: Self-pay

## 2018-09-19 DIAGNOSIS — F411 Generalized anxiety disorder: Secondary | ICD-10-CM

## 2018-09-19 DIAGNOSIS — F331 Major depressive disorder, recurrent, moderate: Secondary | ICD-10-CM
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
## 2018-10-03 ENCOUNTER — Other Ambulatory Visit: Payer: Self-pay

## 2018-10-03 ENCOUNTER — Ambulatory Visit: Payer: Self-pay | Admitting: Licensed Clinical Social Worker

## 2018-10-03 DIAGNOSIS — F331 Major depressive disorder, recurrent, moderate: Secondary | ICD-10-CM

## 2018-10-03 DIAGNOSIS — F411 Generalized anxiety disorder: Secondary | ICD-10-CM

## 2018-10-03 NOTE — BH Specialist Note (Signed)
Integrated Behavioral Health Follow Up Phone Visit  MRN: [id number] Name: Latoya Benson  Number of Integrated Behavioral Health Clinician visits: 1/6  Type of Service: Integrated Behavioral Health- Individual/Family Interpretor:No. Interpretor Name and Language: Not applicable.  SUBJECTIVE: Latoya Benson is a 23 y.o. female accompanied by herself. Patient was referred by Michiel Cowboy PA-C for mental health. Patient reports the following symptoms/concerns: She reports that she has been doing okay. She explains that there has been a few incidents between her and her husband. She explains that her husband ended up leaving for two days and went to his mom's house. She explained that they worked things out and he ended up coming back. She explains that her mood has not been the best because her dad ended up getting arrested and is worried about her grandma being taken care of. She denies suicidal and homicidal thoughts.  Duration of problem: ; Severity of problem: Moderate  OBJECTIVE: Mood: Hopeless and Affect: Appropriate Risk of harm to self or others: No plan to harm self or others  LIFE CONTEXT: Family and Social: see above. 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 Self-Care: see above. Life Changes: see above.  GOALS ADDRESSED: Patient will: 1.  Reduce symptoms of: stress  2.  Increase knowledge and/or ability of: coping skills  3.  Demonstrate ability to: Increase healthy adjustment to current life circumstances  INTERVENTIONS: Interventions utilized:  Brief CBT was utilized the clinician focusing on her relationship problems. Clinician processed with the patient regarding how she has been doing since the last follow up session. Clinician discussed with the patient regarding the recent arguments between her and her husband. Clinician explained that it sounds like one of the biggest problems in her marriage is communication. Clinician discussed coping skills with the  patient in regards to how to better manage her anger and get a handle on her emotions instead of taking it out on someone else. Clinician suggested that she find a healthy outlet to channel her symptoms of anger.  Standardized Assessments completed: GAD-7 and PHQ 9  ASSESSMENT: Patient currently experiencing relationship issues and mood swings due to family probelms.   Patient may benefit from see above.  PLAN: 1. Follow up with behavioral health clinician on : two weeks or earlier if needed. 2. Behavioral recommendations: see above 3. Referral(s): Integrated [facility] (In Clinic) 4. "From scale of 1-10, how likely are you to follow plan?": 10  Latoya Forts, LCSW

## 2018-10-16 ENCOUNTER — Telehealth: Payer: Self-pay | Admitting: Pharmacy Technician

## 2018-10-16 NOTE — Telephone Encounter (Signed)
Patient failed to provide requested financial documentation. Financial documentation is required in order to determine patient's eligibility for [facility]'s program. No additional medication assistance will be provided until patient provides requested financial documentation. Patient notified by letter.  Teona Vargus CPhT/Eligibility Specialist [facility]  

## 2018-10-17 ENCOUNTER — Ambulatory Visit: Payer: Self-pay | Admitting: Urology

## 2018-10-17 ENCOUNTER — Other Ambulatory Visit: Payer: Self-pay

## 2018-10-17 ENCOUNTER — Ambulatory Visit: Payer: Self-pay | Admitting: Licensed Clinical Social Worker

## 2018-10-17 DIAGNOSIS — M109 Gout, unspecified: Secondary | ICD-10-CM

## 2018-10-17 DIAGNOSIS — F331 Major depressive disorder, recurrent, moderate: Secondary | ICD-10-CM

## 2018-10-17 DIAGNOSIS — F411 Generalized anxiety disorder: Secondary | ICD-10-CM
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
## 2018-10-17 NOTE — Progress Notes (Signed)
Virtual Visit via Telephone Note  I connected with Tonye Pearson on 10/17/2018 at 1912 by audio/visual and verified that I am speaking with the correct person using two identifiers.  They are located at home.  I am located at my home.    This visit type was conducted due to national recommendations for restrictions regarding the COVID-19 Pandemic (e.g. social distancing).  This format is felt to be most appropriate for this patient at this time.  All issues noted in this document were discussed and addressed.  No physical exam was performed.   I discussed the limitations, risks, security and privacy concerns of performing an evaluation and management service by telephone and the availability of in person appointments. I also discussed with the patient that there may be a patient responsible charge related to this service. The patient expressed understanding and agreed to proceed.   History of Present Illness: Mrs. Ayad is a 23 year old female with a history of gout.  She is taking allopurinol and is not experiencing side effects.     Observations/Objective: She does not sound distressed.   Assessment and Plan:  1. Gout -schedule labs  Follow Up Instructions:  Follow up with labs.  See Health Department for [facility].   I discussed the assessment and treatment plan with the patient. The patient was provided an opportunity to ask questions and all were answered. The patient agreed with the plan and demonstrated an understanding of the instructions.   The patient was advised to call back or seek an in-person evaluation if the symptoms worsen or if the condition fails to improve as anticipated.  I provided 5 minutes of non-face-to-face time during this encounter.   Ulysses Alper, PA-C

## 2018-10-31 ENCOUNTER — Other Ambulatory Visit: Payer: Self-pay

## 2018-10-31 ENCOUNTER — Ambulatory Visit: Payer: Self-pay | Admitting: Licensed Clinical Social Worker

## 2018-10-31 DIAGNOSIS — F331 Major depressive disorder, recurrent, moderate: Secondary | ICD-10-CM

## 2018-11-01 LAB — TSH: TSH: 1.71 u[IU]/mL (ref 0.450–4.500)

## 2018-11-01 LAB — LIPID PANEL
Chol/HDL Ratio: 5 ratio — ABNORMAL HIGH (ref 0.0–4.4)
Cholesterol, Total: 149 mg/dL (ref 100–199)
HDL: 30 mg/dL — ABNORMAL LOW (ref >39)
LDL Calculated: 73 mg/dL (ref 0–99)
Triglycerides: 229 mg/dL — ABNORMAL HIGH (ref 0–149)
VLDL Cholesterol Cal: 46 mg/dL — ABNORMAL HIGH (ref 5–40)

## 2018-11-01 LAB — CBC WITH DIFFERENTIAL/PLATELET
Basophils Absolute: 0.1 10*3/uL (ref 0.0–0.2)
Basos: 1 %
EOS (ABSOLUTE): 0.2 10*3/uL (ref 0.0–0.4)
Eos: 3 %
Hematocrit: 35.1 % (ref 34.0–46.6)
Hemoglobin: 12 g/dL (ref 11.1–15.9)
Immature Grans (Abs): 0 10*3/uL (ref 0.0–0.1)
Immature Granulocytes: 0 %
Lymphocytes Absolute: 2.6 10*3/uL (ref 0.7–3.1)
Lymphs: 32 %
MCH: 30.7 pg (ref 26.6–33.0)
MCHC: 34.2 g/dL (ref 31.5–35.7)
MCV: 90 fL (ref 79–97)
Monocytes Absolute: 0.4 10*3/uL (ref 0.1–0.9)
Monocytes: 5 %
Neutrophils Absolute: 4.8 10*3/uL (ref 1.4–7.0)
Neutrophils: 59 %
Platelets: 533 10*3/uL — ABNORMAL HIGH (ref 150–450)
RBC: 3.91 x10E6/uL (ref 3.77–5.28)
RDW: 13.5 % (ref 11.7–15.4)
WBC: 8.2 10*3/uL (ref 3.4–10.8)

## 2018-11-01 LAB — COMPREHENSIVE METABOLIC PANEL
ALT: 36 IU/L — ABNORMAL HIGH (ref 0–32)
AST: 14 IU/L (ref 0–40)
Albumin/Globulin Ratio: 1.5 (ref 1.2–2.2)
Albumin: 4.4 g/dL (ref 3.9–5.0)
Alkaline Phosphatase: 58 IU/L (ref 39–117)
BUN/Creatinine Ratio: 9 (ref 9–23)
BUN: 8 mg/dL (ref 6–20)
Bilirubin Total: 0.2 mg/dL (ref 0.0–1.2)
CO2: 20 mmol/L (ref 20–29)
Calcium: 9.5 mg/dL (ref 8.7–10.2)
Chloride: 103 mmol/L (ref 96–106)
Creatinine, Ser: 0.85 mg/dL (ref 0.57–1.00)
GFR calc Af Amer: 111 mL/min/{1.73_m2} (ref >59)
GFR calc non Af Amer: 96 mL/min/{1.73_m2} (ref >59)
Globulin, Total: 3 g/dL (ref 1.5–4.5)
Glucose: 98 mg/dL (ref 65–99)
Potassium: 3.7 mmol/L (ref 3.5–5.2)
Sodium: 140 mmol/L (ref 134–144)
Total Protein: 7.4 g/dL (ref 6.0–8.5)

## 2018-11-01 LAB — HEMOGLOBIN A1C
Est. average glucose Bld gHb Est-mCnc: 111 mg/dL
Hgb A1c MFr Bld: 5.5 % (ref 4.8–5.6)

## 2018-11-05 ENCOUNTER — Other Ambulatory Visit: Payer: Self-pay

## 2018-11-05 ENCOUNTER — Ambulatory Visit: Payer: Self-pay | Admitting: Licensed Clinical Social Worker

## 2018-11-05 DIAGNOSIS — F411 Generalized anxiety disorder: Secondary | ICD-10-CM

## 2018-11-05 DIAGNOSIS — F331 Major depressive disorder, recurrent, moderate: Secondary | ICD-10-CM

## 2018-11-05 NOTE — BH Specialist Note (Signed)
Integrated Behavioral Health Follow Up Visit Via Phone  MRN: [id number] Name: Latoya Benson  Number of Integrated Behavioral Health Clinician visits: 3/6   Type of Service: Integrated Behavioral Health- Individual/Family Interpretor:No. Interpretor Name and Language: Not applicable.  SUBJECTIVE: Latoya Benson is a 23 y.o. female accompanied by herself. Patient was referred by Michiel Cowboy PA-C for mental health. Patient reports the following symptoms/concerns: She reports that she has been doing a lot better in the last two weeks since their last session. She reports that she has picked up more hours at work and moved into a new place with her husband. She explains that she and her husband had one major argument where she left the house for a few days but eventually they both apologize and worked things out. She explains that things at work have been stressful due to a few people quitting and the director put in her notice. She denies suicidal and homicidal thoughts. Duration of problem: ; Severity of problem: mild  OBJECTIVE: Mood: Euthymic and Affect: Appropriate Risk of harm to self or others: No plan to harm self or others  LIFE CONTEXT: Family and Social: see above. School/Work: see above. Self-Care: see above. Life Changes: see above.  GOALS ADDRESSED: Patient will: 1.  Reduce symptoms of: stress  2.  Increase knowledge and/or ability of: coping skills and self-management skills  3.  Demonstrate ability to: Increase healthy adjustment to current life circumstances  INTERVENTIONS: Interventions utilized:  Brief CBT was utilized by the clinician focusing on the patient's stress and affect on her behavior. Clinician processed with the patient regarding how she has been doing since the last follow up session. Clinician discussed with the patient the stressors that she has been experiencing in the last few weeks. Clinician explained to the patient that it sounds like she  had a stressful day at work and came home taking things out on her husband. Clinician stressed the importance of practicing self care and communicating to her husband when she has had a bad day rather than holding things in until she explodes. Standardized Assessments completed: GAD-7 and PHQ 9  ASSESSMENT: Patient currently experiencing see above.   Patient may benefit from see above.  PLAN: 1. Follow up with behavioral health clinician on : two weeks or earlier if needed. 2. Behavioral recommendations: see above. 3. Referral(s): Integrated [employer] (In Clinic) 4. "From scale of 1-10, how likely are you to follow plan?":   Althia Forts, LCSW

## 2018-11-19 ENCOUNTER — Other Ambulatory Visit: Payer: Self-pay

## 2018-11-19 ENCOUNTER — Ambulatory Visit: Payer: Self-pay | Admitting: Licensed Clinical Social Worker

## 2018-11-19 DIAGNOSIS — F331 Major depressive disorder, recurrent, moderate: Secondary | ICD-10-CM

## 2018-11-19 DIAGNOSIS — F411 Generalized anxiety disorder: Secondary | ICD-10-CM

## 2018-11-19 NOTE — BH Specialist Note (Signed)
Integrated Behavioral Health Follow Up Visit Via Phone  MRN: [id number] Name: Latoya Benson  Number of [place] Clinician visits: 4/6  Type of Service: [place] Interpretor:No. Interpretor Name and Language: N/A.  SUBJECTIVE: Latoya Benson is a 23 y.o. female accompanied by herself. Patient was referred by Zara Council PA-C for mental health . Patient reports the following symptoms/concerns: She reports that she has been working five days a week 7 or 8 an to 6 pm. She notes that things are still stressful at work with limited staff but she is glad to be able to have the extra income at the moment. She notes that due to her working a lot and her husband working for his uncle with his lawn care business that there have not been any arguments or fights between them. She explains that they are working on finding a vehicle so she doesn't have to rely on others for transportation which is stressful. She denies suicidal and homicidal thoughts.  Duration of problem: ; Severity of problem: mild  OBJECTIVE: Mood: Euthymic and Affect: Appropriate Risk of harm to self or others: No plan to harm self or others  LIFE CONTEXT: Family and Social: see above. School/Work: see above. Self-Care: see above. Life Changes: see above.  GOALS ADDRESSED: Patient will: 1.  Reduce symptoms of: stress  2.  Increase knowledge and/or ability of: self-management skills and stress reduction  3.  Demonstrate ability to: Increase healthy adjustment to current life circumstances  INTERVENTIONS: Interventions utilized:  Brief CBT was utilized by the clinician focusing on the patient's stress and affect on normal cognition. Clinician processed with the patient regarding how she has been doing since the last follow up session. Clinician explained to the patient that it sounds like she has been working a lot and is feeling overwhelmed at times. Clinician  discussed with the patient the dynamics between her and her husband. Clinician encouraged the patient to practice self care and continue to use the communication she has learned in therapy to avoid conflicts with her husband, parents, etc.  Standardized Assessments completed: GAD-7 and PHQ 9  ASSESSMENT: Patient currently experiencing see above .   Patient may benefit from see above.  PLAN: 1. Follow up with behavioral health clinician on : two weeks or earlier if needed. 2. Behavioral recommendations: see above.  3. Referral(s): [facility]) 4. "From scale of 1-10, how likely are you to follow plan?":   Bayard Hugger, LCSW

## 2018-12-03 ENCOUNTER — Telehealth: Payer: Self-pay | Admitting: Licensed Clinical Social Worker

## 2018-12-03 ENCOUNTER — Ambulatory Visit: Payer: Self-pay | Admitting: Licensed Clinical Social Worker

## 2018-12-03 NOTE — Telephone Encounter (Signed)
Clinician attempted to contact the patient twice during their scheduled phone visit and left voicemail with contact information. 

## 2018-12-22 ENCOUNTER — Emergency Department: Payer: Self-pay

## 2018-12-22 ENCOUNTER — Other Ambulatory Visit: Payer: Self-pay

## 2018-12-22 ENCOUNTER — Encounter: Payer: Self-pay | Admitting: Emergency Medicine

## 2018-12-22 ENCOUNTER — Emergency Department
Admission: EM | Admit: 2018-12-22 | Discharge: 2018-12-22 | Disposition: A | Discharge status: Home / Self Care | Payer: Self-pay | Attending: Emergency Medicine | Admitting: Emergency Medicine

## 2018-12-22 DIAGNOSIS — Z87891 Personal history of nicotine dependence: Secondary | ICD-10-CM | POA: Insufficient documentation

## 2018-12-22 DIAGNOSIS — Z79899 Other long term (current) drug therapy: Secondary | ICD-10-CM | POA: Insufficient documentation

## 2018-12-22 DIAGNOSIS — J9801 Acute bronchospasm: Secondary | ICD-10-CM | POA: Insufficient documentation

## 2018-12-22 MED ORDER — HYDROCOD POLST-CPM POLST ER 10-8 MG/5ML PO SUER: 5.0000 mL | Freq: Two times a day (BID) | ORAL | 0 refills | Status: DC

## 2018-12-22 MED ORDER — METHYLPREDNISOLONE 4 MG PO TBPK: ORAL_TABLET | ORAL | 0 refills | Status: DC

## 2018-12-22 NOTE — ED Provider Notes (Signed)
[facility] Emergency Department Provider Note   ____________________________________________   First MD Initiated Contact with Patient 12/22/18 1010     (approximate)  I have reviewed the triage vital signs and the nursing notes.   HISTORY  Chief Complaint Cough    HPI Latoya Benson is a 23 y.o. female patient presents with nonproductive cough for 1 month.  Patient states cough worsened last night and she believes she had a low-grade fever.  Patient also complain of sore throat.  Patient state had 2 episodes of vomiting last night.  Patient states  COVID 2 weeks ago was negative.  Patient also complained of nasal congestion and postnasal drainage.        Past Medical History:  Diagnosis Date  . Medical history non-contributory   . PCOS (polycystic ovarian syndrome)   . PCOS (polycystic ovarian syndrome)   . PCOS (polycystic ovarian syndrome) 03/2018    Patient Active Problem List   Diagnosis Date Noted  . Pyelonephritis 07/31/2017  . Sepsis (HCC) 04/02/2016    Past Surgical History:  Procedure Laterality Date  . CHOLECYSTECTOMY  05/22/2013  . COLON SURGERY  day after birth  . 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 Authorizing Provider  acetaminophen (TYLENOL) 500 MG tablet Take 2,000 mg by mouth every 6 (six) hours as needed.    [provider]  allopurinol (ZYLOPRIM) 100 MG tablet Take 1 tablet (100 mg total) by mouth daily. Patient not taking: Reported on 10/17/2018 08/15/18   Doles-Johnson, Teah, NP  allopurinol (ZYLOPRIM) 100 MG tablet Take 1 tablet (100 mg total) by mouth 2 (two) times daily. 08/29/18   Michiel CowboyMcGowan, Shannon A, PA-C  cefdinir (OMNICEF) 300 MG capsule Take 1 capsule (300 mg total) by mouth 2 (two) times daily. Patient not taking: Reported on 08/29/2018 08/24/18   Sharman CheekStafford, Phillip, MD  chlorpheniramine-HYDROcodone [facility](TUSSIONEX PENNKINETIC ER) 10-8  MG/5ML SUER Take 5 mLs by mouth 2 (two) times daily. 12/22/18   Joni ReiningSmith, Ronald K, PA-C  colchicine 0.6 MG tablet Take 2 tablet at once then 1 additional tablet an hour later Patient not taking: Reported on 08/29/2018 08/15/18   Doles-Johnson, Teah, NP  desogestrel-ethinyl estradiol (MIRCETTE) 0.15-0.02/0.01 MG (21/5) tablet Take 1 tablet by mouth at bedtime. 03/19/18   Linzie CollinEvans, David Mcfarlan, MD  indomethacin (INDOCIN) 25 MG capsule Take 1 capsule (25 mg total) by mouth 3 (three) times daily with meals. Patient not taking: Reported on 08/15/2018 08/06/18   Tommi RumpsSummers, Rhonda L, PA-C  medroxyPROGESTERone (PROVERA) 10 MG tablet Take 1 tablet (10 mg total) by mouth daily for 10 days. 03/20/18 03/30/18  Linzie CollinEvans, David Holbein, MD  methylPREDNISolone (MEDROL DOSEPAK) 4 MG TBPK tablet Take Tapered dose as directed 12/22/18   Joni ReiningSmith, Ronald K, PA-C  naproxen (NAPROSYN) 500 MG tablet Take 1 tablet (500 mg total) by mouth 2 (two) times daily with a meal. Patient not taking: Reported on 08/29/2018 08/24/18   Sharman CheekStafford, Phillip, MD  norgestimate-ethinyl estradiol (SPRINTEC 28) 0.25-35 MG-MCG tablet Take 1 tablet by mouth daily. Patient not taking: Reported on 08/15/2018 03/20/18   Linzie CollinEvans, David Steinhart, MD  ondansetron (ZOFRAN ODT) 4 MG disintegrating tablet Take 1 tablet (4 mg total) by mouth every 8 (eight) hours as needed for nausea or vomiting. Patient not taking: Reported on 08/29/2018 08/24/18   Sharman CheekStafford, Phillip, MD  traMADol (ULTRAM) 50 MG tablet Take 1 tablet (50 mg total) by mouth every  6 (six) hours as needed. Patient not taking: Reported on 08/15/2018 08/06/18   Tommi RumpsSummers, Rhonda L, PA-C    Allergies Patient has no known allergies.  Family History  Problem Relation Age of Onset  . Diabetes Mother   . COPD Father   . Hypertension Father     Social History Social History   Tobacco Use  . Smoking status: Former Games developermoker  . Smokeless tobacco: Never Used  Substance Use Topics  . Alcohol use: Yes  . Drug use: No    Review  of Systems  Constitutional: No fever/chills Eyes: No visual changes. ENT: Sore throat. Cardiovascular: Denies chest pain. Respiratory: Denies shortness of breath.  Nonproductive cough. Gastrointestinal: No abdominal pain.  No nausea, no vomiting.  No diarrhea.  No constipation. Genitourinary: Negative for dysuria.  PCOS Musculoskeletal: Negative for back pain. Skin: Negative for rash. Neurological: Negative for headaches, focal weakness or numbness.   ____________________________________________   PHYSICAL EXAM:  VITAL SIGNS: ED Triage Vitals  Enc Vitals Group     BP 12/22/18 0936 120/73     Pulse Rate 12/22/18 0936 93     Resp 12/22/18 0936 18     Temp 12/22/18 0936 98.8 F (37.1 C)     Temp Source 12/22/18 0936 Oral     SpO2 12/22/18 0936 98 %     Weight 12/22/18 0936 208 lb (94.3 kg)     Height 12/22/18 0936 5\' 2"  (1.575 m)     Head Circumference --      Peak Flow --      Pain Score 12/22/18 0945 0     Pain Loc --      Pain Edu? --      Excl. in [place]? --    Constitutional: Alert and oriented. Well appearing and in no acute distress. Nose: Edematous nasal turbinates clear rhinorrhea.  Mouth/Throat: Mucous membranes are moist.  Oropharynx non-erythematous.  Postnasal drainage. Neck: No stridor.   Hematological/Lymphatic/Immunilogical: No cervical lymphadenopathy. Cardiovascular: Normal rate, regular rhythm. Grossly normal heart sounds.  Good peripheral circulation. Respiratory: Normal respiratory effort.  No retractions. Lungs CTAB. Gastrointestinal: Soft and nontender. No distention. No abdominal bruits. No CVA tenderness. Genitourinary: Deferred Skin:  Skin is warm, dry and intact. No rash noted. Psychiatric: Mood and affect are normal. Speech and behavior are normal.  ____________________________________________   LABS (all labs ordered are listed, but only abnormal results are displayed)  Labs Reviewed  GROUP A STREP BY PCR    ____________________________________________  EKG   ____________________________________________  RADIOLOGY  ED MD interpretation:    Official radiology report(s): Dg Chest 2 View  Result Date: 12/22/2018 CLINICAL DATA:  Cough EXAM: CHEST - 2 VIEW COMPARISON:  August 14, 2018. FINDINGS: The lungs are clear. Heart size and pulmonary vascularity are normal. No adenopathy. No bone lesions. IMPRESSION: No edema or consolidation. Electronically Signed   By: Bretta BangWilliam  Woodruff III M.D.   On: 12/22/2018 10:33    ____________________________________________   PROCEDURES  Procedure(s) performed (including Critical Care):  Procedures   ____________________________________________   INITIAL IMPRESSION / ASSESSMENT AND PLAN / ED COURSE  As part of my medical decision making, I reviewed the following data within the electronic MEDICAL RECORD NUMBER        Latoya PearsonStephanie L Figge was evaluated in Emergency Department on 12/22/2018 for the symptoms described in the history of present illness. She was evaluated in the context of the global COVID-19 pandemic, which necessitated consideration that the patient might be at risk for infection  with the SARS-CoV-2 virus that causes COVID-19. Institutional protocols and algorithms that pertain to the evaluation of patients at risk for COVID-19 are in a state of rapid change based on information released by regulatory bodies including the CDC and federal and state organizations. These policies and algorithms were followed during the patient's care in the ED.   Patient presents with 1 month of nonproductive cough.  Patient physical exam is grossly unremarkable save for cough.  Further evaluation chest x-ray was unremarkable.  Patient given discharge care instruction for cough secondary to bronchospasms.  Patient advised to follow-up with PCP.      ____________________________________________   FINAL CLINICAL IMPRESSION(S) / ED DIAGNOSES  Final diagnoses:   Cough due to bronchospasm     ED Discharge Orders         Ordered    chlorpheniramine-HYDROcodone (TUSSIONEX PENNKINETIC ER) 10-8 MG/5ML SUER  2 times daily     12/22/18 1038    methylPREDNISolone (MEDROL DOSEPAK) 4 MG TBPK tablet     12/22/18 1038           Note:  This document was prepared using Dragon voice recognition software and may include unintentional dictation errors.    Sable Feil, PA-C 12/22/18 1040    Delman Kitten, MD 12/22/18 [phone]

## 2018-12-22 NOTE — ED Triage Notes (Signed)
Pt to ED via POV c/o cough x 1 month. Pt states that last night she felt like she had low grade fever and she threw up twice. Pt is not vomiting anymore. Pt states that she was tested for COVID 2 weeks ago and was negative. Pt is in NAD.

## 2019-02-19 ENCOUNTER — Emergency Department: Payer: Self-pay

## 2019-02-19 ENCOUNTER — Emergency Department
Admission: EM | Admit: 2019-02-19 | Discharge: 2019-02-19 | Disposition: A | Discharge status: Home / Self Care | Payer: Self-pay | Attending: Emergency Medicine | Admitting: Emergency Medicine

## 2019-02-19 ENCOUNTER — Encounter: Payer: Self-pay | Admitting: Emergency Medicine

## 2019-02-19 ENCOUNTER — Other Ambulatory Visit: Payer: Self-pay

## 2019-02-19 DIAGNOSIS — X500XXA Overexertion from strenuous movement or load, initial encounter: Secondary | ICD-10-CM | POA: Insufficient documentation

## 2019-02-19 DIAGNOSIS — S8992XA Unspecified injury of left lower leg, initial encounter: Secondary | ICD-10-CM

## 2019-02-19 DIAGNOSIS — Y93F2 Activity, caregiving, lifting: Secondary | ICD-10-CM | POA: Insufficient documentation

## 2019-02-19 DIAGNOSIS — M109 Gout, unspecified: Secondary | ICD-10-CM | POA: Insufficient documentation

## 2019-02-19 DIAGNOSIS — Y990 Civilian activity done for income or pay: Secondary | ICD-10-CM | POA: Insufficient documentation

## 2019-02-19 DIAGNOSIS — Z760 Encounter for issue of repeat prescription: Secondary | ICD-10-CM | POA: Insufficient documentation

## 2019-02-19 DIAGNOSIS — Y9259 Other trade areas as the place of occurrence of the external cause: Secondary | ICD-10-CM | POA: Insufficient documentation

## 2019-02-19 DIAGNOSIS — Z79899 Other long term (current) drug therapy: Secondary | ICD-10-CM | POA: Insufficient documentation

## 2019-02-19 DIAGNOSIS — S80912A Unspecified superficial injury of left knee, initial encounter: Secondary | ICD-10-CM | POA: Insufficient documentation

## 2019-02-19 DIAGNOSIS — Z87891 Personal history of nicotine dependence: Secondary | ICD-10-CM | POA: Insufficient documentation

## 2019-02-19 MED ORDER — ALLOPURINOL 100 MG PO TABS: 100.0000 mg | ORAL_TABLET | Freq: Two times a day (BID) | ORAL | 0 refills | Status: DC

## 2019-02-19 MED ORDER — NAPROXEN 500 MG PO TABS: 500.0000 mg | ORAL_TABLET | Freq: Two times a day (BID) | ORAL | 0 refills | Status: DC

## 2019-02-19 MED ORDER — KETOROLAC TROMETHAMINE 30 MG/ML IJ SOLN
30.0000 mg | Freq: Once | INTRAMUSCULAR | Status: AC
  Administered 2019-02-19: 30 mg via INTRAMUSCULAR
  Filled 2019-02-19: qty 1

## 2019-02-19 NOTE — ED Notes (Signed)
Pt left knee wrapped with ace wrap, pt given crutches and instructions on how to use.

## 2019-02-19 NOTE — ED Triage Notes (Signed)
Pt to ER with c/o left knee pain that started this AM after helping a resident up.  Pt also states she is having a gout flair in her right foot.

## 2019-02-19 NOTE — ED Provider Notes (Signed)
[facility] Emergency Department Provider Note  ____________________________________________  Time seen: Approximately 4:03 PM  I have reviewed the triage vital signs and the nursing notes.   HISTORY  Chief Complaint Knee Pain    HPI Latoya Benson is a 23 y.o. female that presents to the emergency department for evaluation of left knee pain after injury at work today.  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  She states that the resident was not helping her bear weight and her knee became painful during the transfer.  No previous knee injury.  Patient also states that she takes allopurinol twice daily for frequent gout flares in her left and right ankle.  She has been out for 1 week and is starting to get pain to her right ankle.  She states that she [facility] and is waiting to hear back from the medication management clinic about refill.  No fevers, numbness, tingling.   Past Medical History:  Diagnosis Date  . Medical history non-contributory   . PCOS (polycystic ovarian syndrome)   . PCOS (polycystic ovarian syndrome)   . PCOS (polycystic ovarian syndrome) 03/2018    Patient Active Problem List   Diagnosis Date Noted  . Pyelonephritis 07/31/2017  . Sepsis (HCC) 04/02/2016    Past Surgical History:  Procedure Laterality Date  . CHOLECYSTECTOMY  05/22/2013  . COLON SURGERY  day after birth  . 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 Authorizing Provider  acetaminophen (TYLENOL) 500 MG tablet Take 2,000 mg by mouth every 6 (six) hours as needed.    [provider]  allopurinol (ZYLOPRIM) 100 MG tablet Take 1 tablet (100 mg total) by mouth 2 (two) times daily. 02/19/19   Enid DerryWagner, Jaycie Kregel, PA-C  cefdinir (OMNICEF) 300 MG capsule Take 1 capsule (300 mg  total) by mouth 2 (two) times daily. Patient not taking: Reported on 08/29/2018 08/24/18   Sharman CheekStafford, Phillip, MD  chlorpheniramine-HYDROcodone [facility](TUSSIONEX PENNKINETIC ER) 10-8 MG/5ML SUER Take 5 mLs by mouth 2 (two) times daily. 12/22/18   Joni ReiningSmith, Ronald K, PA-C  colchicine 0.6 MG tablet Take 2 tablet at once then 1 additional tablet an hour later Patient not taking: Reported on 08/29/2018 08/15/18   Doles-Johnson, Teah, NP  desogestrel-ethinyl estradiol (MIRCETTE) 0.15-0.02/0.01 MG (21/5) tablet Take 1 tablet by mouth at bedtime. 03/19/18   Linzie CollinEvans, David Pellman, MD  indomethacin (INDOCIN) 25 MG capsule Take 1 capsule (25 mg total) by mouth 3 (three) times daily with meals. Patient not taking: Reported on 08/15/2018 08/06/18   Tommi RumpsSummers, Rhonda L, PA-C  medroxyPROGESTERone (PROVERA) 10 MG tablet Take 1 tablet (10 mg total) by mouth daily for 10 days. 03/20/18 03/30/18  Linzie CollinEvans, David Baez, MD  methylPREDNISolone (MEDROL DOSEPAK) 4 MG TBPK tablet Take Tapered dose as directed 12/22/18   Joni ReiningSmith, Ronald K, PA-C  naproxen (NAPROSYN) 500 MG tablet Take 1 tablet (500 mg total) by mouth 2 (two) times daily with a meal. 02/19/19 02/19/20  Enid DerryWagner, Vikash Nest, PA-C  norgestimate-ethinyl estradiol (SPRINTEC 28) 0.25-35 MG-MCG tablet Take 1 tablet by mouth daily. Patient not taking: Reported on 08/15/2018 03/20/18   Linzie CollinEvans, David Navarrete, MD  ondansetron (ZOFRAN ODT) 4 MG disintegrating tablet Take 1 tablet (4 mg total) by mouth every 8 (eight) hours as needed for nausea or vomiting. Patient not taking: Reported on  08/29/2018 08/24/18   Carrie Mew, MD  traMADol (ULTRAM) 50 MG tablet Take 1 tablet (50 mg total) by mouth every 6 (six) hours as needed. Patient not taking: Reported on 08/15/2018 08/06/18   Johnn Hai, PA-C    Allergies Patient has no known allergies.  Family History  Problem Relation Age of Onset  . Diabetes Mother   . COPD Father   . Hypertension Father     Social History Social History   Tobacco Use  .  Smoking status: Former Research scientist (life sciences)  . Smokeless tobacco: Never Used  Substance Use Topics  . Alcohol use: Yes  . Drug use: No     Review of Systems  Constitutional: No fever/chills Gastrointestinal: No nausea, no vomiting.  Musculoskeletal: Positive for left knee pain and right ankle pain. Skin: Negative for rash, abrasions, lacerations, ecchymosis. Neurological: Negative for numbness or tingling   ____________________________________________   PHYSICAL EXAM:  VITAL SIGNS: ED Triage Vitals  Enc Vitals Group     BP 02/19/19 1508 112/77     Pulse Rate 02/19/19 1508 97     Resp 02/19/19 1508 18     Temp 02/19/19 1508 99.1 F (37.3 C)     Temp src --      SpO2 02/19/19 1508 99 %     Weight 02/19/19 1511 200 lb (90.7 kg)     Height 02/19/19 1511 5\' 2"  (1.575 m)     Head Circumference --      Peak Flow --      Pain Score 02/19/19 1509 8     Pain Loc --      Pain Edu? --      Excl. in [place]? --      Constitutional: Alert and oriented. Well appearing and in no acute distress. Eyes: Conjunctivae are normal. PERRL. EOMI. Head: Atraumatic. ENT:      Ears:      Nose: No congestion/rhinnorhea.      Mouth/Throat: Mucous membranes are moist.  Neck: No stridor.  Cardiovascular: Normal rate, regular rhythm.  Good peripheral circulation. Respiratory: Normal respiratory effort without tachypnea or retractions. Lungs CTAB. Good air entry to the bases with no decreased or absent breath sounds. Musculoskeletal: Full range of motion to all extremities. No gross deformities appreciated.  Mild tenderness to palpation to left medial knee.  Full range of motion of the knee.  No swelling or ecchymosis.  Full range of motion of right ankle without pain.  No swelling, ecchymosis, erythema to ankle. Neurologic:  Normal speech and language. No gross focal neurologic deficits are appreciated.  Skin:  Skin is warm, dry and intact. No rash noted. Psychiatric: Mood and affect are normal. Speech and  behavior are normal. Patient exhibits appropriate insight and judgement.   ____________________________________________   LABS (all labs ordered are listed, but only abnormal results are displayed)  Labs Reviewed - No data to display ____________________________________________  EKG   ____________________________________________  RADIOLOGY Robinette Haines, personally viewed and evaluated these images (plain radiographs) as part of my medical decision making, as well as reviewing the written report by the radiologist.  Dg Knee Complete 4 Views Left  Result Date: 02/19/2019 CLINICAL DATA:  Knee injury EXAM: LEFT KNEE - COMPLETE 4+ VIEW COMPARISON:  None. FINDINGS: No evidence of fracture, dislocation, or joint effusion. No evidence of arthropathy or other focal bone abnormality. Soft tissues are unremarkable. IMPRESSION: Negative. Electronically Signed   By: Donavan Foil M.D.   On: 02/19/2019 16:59  ____________________________________________    PROCEDURES  Procedure(s) performed:    Procedures    Medications  ketorolac (TORADOL) 30 MG/ML injection 30 mg (30 mg Intramuscular Given 02/19/19 1612)     ____________________________________________   INITIAL IMPRESSION / ASSESSMENT AND PLAN / ED COURSE  Pertinent labs & imaging results that were available during my care of the patient were reviewed by me and considered in my medical decision making (see chart for details).  Review of the [place] CSRS was performed in accordance of the [facility] prior to dispensing any controlled drugs.     Patient's diagnosis is consistent with left knee injury, gout and medication refill.  Vital signs and exam are reassuring.  Knee x-ray negative for acute bony abnormalities.  Knee was Ace wrapped and crutches were given.  Patient was given IM Toradol for pain and inflammation.  Patient's allopurinol was refilled.  She was also given naprosyn for gout.  Patient will be discharged home with  prescriptions for allopurinol and naprosyn. Patient is to follow up with PCP and ortho as directed. Patient is given ED precautions to return to the ED for any worsening or new symptoms.  Latoya Benson was evaluated in Emergency Department on 02/19/2019 for the symptoms described in the history of present illness. She was evaluated in the context of the global COVID-19 pandemic, which necessitated consideration that the patient might be at risk for infection with the SARS-CoV-2 virus that causes COVID-19. Institutional protocols and algorithms that pertain to the evaluation of patients at risk for COVID-19 are in a state of rapid change based on information released by regulatory bodies including the CDC and federal and state organizations. These policies and algorithms were followed during the patient's care in the ED.   ____________________________________________  FINAL CLINICAL IMPRESSION(S) / ED DIAGNOSES  Final diagnoses:  Injury of left knee, initial encounter  Acute gout of right ankle, unspecified cause  Medication refill      NEW MEDICATIONS STARTED DURING THIS VISIT:  ED Discharge Orders         Ordered    naproxen (NAPROSYN) 500 MG tablet  2 times daily with meals     02/19/19 1719    allopurinol (ZYLOPRIM) 100 MG tablet  2 times daily     02/19/19 1719              This chart was dictated using voice recognition software/Dragon. Despite best efforts to proofread, errors can occur which can change the meaning. Any change was purely unintentional.    Enid Derry, PA-C 02/19/19 2021    Sharman Cheek, MD 02/19/19 2222

## 2019-02-19 NOTE — ED Notes (Addendum)
Pt report injury to leg injury today, able to stand and take couple steps and pivot onto stretcher from wheel chair. Pt also reports she usually takes gout medic daily but currently out due to having to wait on medication management, currently reports flair up of gout in left ankle/foot.

## 2019-05-02 ENCOUNTER — Other Ambulatory Visit: Payer: Self-pay

## 2019-05-02 DIAGNOSIS — Z20822 Contact with and (suspected) exposure to covid-19: Secondary | ICD-10-CM

## 2019-05-05 LAB — NOVEL CORONAVIRUS, NAA: SARS-CoV-2, NAA: NOT DETECTED

## 2019-07-03 ENCOUNTER — Telehealth: Payer: Self-pay | Admitting: Pharmacy Technician

## 2019-07-03 NOTE — Telephone Encounter (Signed)
Patient failed to provide requested 2020 financial documentation. No additional medication assistance will be provided by [facility] without the required proof of income documentation. Patient notified by letter  Danni Shima CPhT [facility] 

## 2019-07-08 ENCOUNTER — Encounter: Payer: Self-pay | Admitting: Emergency Medicine

## 2019-07-08 ENCOUNTER — Emergency Department
Admission: EM | Admit: 2019-07-08 | Discharge: 2019-07-08 | Disposition: A | Discharge status: Home / Self Care | Payer: BC Managed Care – PPO | Attending: Emergency Medicine | Admitting: Emergency Medicine

## 2019-07-08 ENCOUNTER — Emergency Department: Payer: BC Managed Care – PPO

## 2019-07-08 ENCOUNTER — Other Ambulatory Visit: Payer: Self-pay

## 2019-07-08 DIAGNOSIS — Z79899 Other long term (current) drug therapy: Secondary | ICD-10-CM | POA: Insufficient documentation

## 2019-07-08 DIAGNOSIS — A084 Viral intestinal infection, unspecified: Secondary | ICD-10-CM | POA: Insufficient documentation

## 2019-07-08 DIAGNOSIS — Z87891 Personal history of nicotine dependence: Secondary | ICD-10-CM | POA: Insufficient documentation

## 2019-07-08 DIAGNOSIS — R109 Unspecified abdominal pain: Secondary | ICD-10-CM | POA: Diagnosis present

## 2019-07-08 LAB — COMPREHENSIVE METABOLIC PANEL
ALT: 21 U/L (ref 0–44)
AST: 20 U/L (ref 15–41)
Albumin: 4 g/dL (ref 3.5–5.0)
Alkaline Phosphatase: 36 U/L — ABNORMAL LOW (ref 38–126)
Anion gap: 9 (ref 5–15)
BUN: 7 mg/dL (ref 6–20)
CO2: 23 mmol/L (ref 22–32)
Calcium: 9.3 mg/dL (ref 8.9–10.3)
Chloride: 106 mmol/L (ref 98–111)
Creatinine, Ser: 0.85 mg/dL (ref 0.44–1.00)
GFR calc Af Amer: >60 mL/min (ref >60)
GFR calc non Af Amer: >60 mL/min (ref >60)
Glucose, Bld: 95 mg/dL (ref 70–99)
Potassium: 3.4 mmol/L — ABNORMAL LOW (ref 3.5–5.1)
Sodium: 138 mmol/L (ref 135–145)
Total Bilirubin: 0.6 mg/dL (ref 0.3–1.2)
Total Protein: 8.1 g/dL (ref 6.5–8.1)

## 2019-07-08 LAB — URINALYSIS, COMPLETE (UACMP) WITH MICROSCOPIC
Bacteria, UA: NONE SEEN
Bilirubin Urine: NEGATIVE
Glucose, UA: NEGATIVE mg/dL
Hgb urine dipstick: NEGATIVE
Ketones, ur: NEGATIVE mg/dL
Leukocytes,Ua: NEGATIVE
Nitrite: NEGATIVE
Protein, ur: NEGATIVE mg/dL
Specific Gravity, Urine: 1.013 (ref 1.005–1.030)
pH: 5 (ref 5.0–8.0)

## 2019-07-08 LAB — LIPASE, BLOOD: Lipase: 29 U/L (ref 11–51)

## 2019-07-08 LAB — CBC
HCT: 36.9 % (ref 36.0–46.0)
Hemoglobin: 11.8 g/dL — ABNORMAL LOW (ref 12.0–15.0)
MCH: 29.5 pg (ref 26.0–34.0)
MCHC: 32 g/dL (ref 30.0–36.0)
MCV: 92.3 fL (ref 80.0–100.0)
Platelets: 420 10*3/uL — ABNORMAL HIGH (ref 150–400)
RBC: 4 MIL/uL (ref 3.87–5.11)
RDW: 13.5 % (ref 11.5–15.5)
WBC: 6.3 10*3/uL (ref 4.0–10.5)
nRBC: 0 % (ref 0.0–0.2)

## 2019-07-08 LAB — PREGNANCY, URINE: Preg Test, Ur: NEGATIVE

## 2019-07-08 MED ORDER — POTASSIUM CHLORIDE CRYS ER 20 MEQ PO TBCR
40.0000 meq | EXTENDED_RELEASE_TABLET | Freq: Once | ORAL | Status: AC
  Administered 2019-07-08: 40 meq via ORAL
  Filled 2019-07-08: qty 2

## 2019-07-08 MED ORDER — DICYCLOMINE HCL 10 MG PO CAPS: 10.0000 mg | ORAL_CAPSULE | Freq: Four times a day (QID) | ORAL | 0 refills | Status: DC

## 2019-07-08 MED ORDER — SODIUM CHLORIDE 0.9% FLUSH: 3.0000 mL | Freq: Once | INTRAVENOUS | Status: DC

## 2019-07-08 MED ORDER — ONDANSETRON 4 MG PO TBDP: 4.0000 mg | ORAL_TABLET | Freq: Three times a day (TID) | ORAL | 0 refills | Status: AC | PRN

## 2019-07-08 MED ORDER — IOHEXOL 300 MG/ML  SOLN
100.0000 mL | Freq: Once | INTRAMUSCULAR | Status: AC | PRN
  Administered 2019-07-08: 100 mL via INTRAVENOUS
  Filled 2019-07-08: qty 100

## 2019-07-08 NOTE — ED Notes (Signed)
See triage note  Presents with a 1 week of abd pain  States this am this pain was worse  This am  Denies any n/v or d  No fever

## 2019-07-08 NOTE — ED Provider Notes (Signed)
[facility] Emergency Department Provider Note  ____________________________________________  Time seen: Approximately 1:51 PM  I have reviewed the triage vital signs and the nursing notes.   HISTORY  Chief Complaint Abdominal Pain and Diarrhea    HPI Latoya Benson is a 24 y.o. female that presents to the emergency department for evaluation of mid abdominal pain for 4 days. Symptoms started as a headache 1 week ago. She has had a loss of appetite since Friday. No vomiting.  She has had 2 episodes of diarrhea today.  She had 5 episodes of diarrhea yesterday.  No blood or mucus in her stools. Abdominal pain is in the center of her abdomen and varies between the top of her abdomen and her lower abdomen. It is constant. No urinary symptoms. No vaginal discharge or new sexual partners. No history of STD. She had several intestinal blockages as an infant, which required surgery. Hx of cholecystectomy.    Past Medical History:  Diagnosis Date  . Medical history non-contributory   . PCOS (polycystic ovarian syndrome)   . PCOS (polycystic ovarian syndrome)   . PCOS (polycystic ovarian syndrome) 03/2018    Patient Active Problem List   Diagnosis Date Noted  . Pyelonephritis 07/31/2017  . Sepsis (HCC) 04/02/2016    Past Surgical History:  Procedure Laterality Date  . CHOLECYSTECTOMY  05/22/2013  . COLON SURGERY  day after birth  . 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 Authorizing Provider  acetaminophen (TYLENOL) 500 MG tablet Take 2,000 mg by mouth every 6 (six) hours as needed.    [provider]  allopurinol (ZYLOPRIM) 100 MG tablet Take 1 tablet (100 mg total) by mouth 2 (two) times daily. 02/19/19   Enid Derry, PA-C  chlorpheniramine-HYDROcodone (TUSSIONEX PENNKINETIC ER) 10-8 MG/5ML SUER Take 5 mLs by mouth 2 (two) times daily. 12/22/18   Joni Reining, PA-C   desogestrel-ethinyl estradiol (MIRCETTE) 0.15-0.02/0.01 MG (21/5) tablet Take 1 tablet by mouth at bedtime. 03/19/18   Linzie Collin, MD  dicyclomine (BENTYL) 10 MG capsule Take 1 capsule (10 mg total) by mouth 4 (four) times daily for 2 days. 07/08/19 07/10/19  Enid Derry, PA-C  medroxyPROGESTERone (PROVERA) 10 MG tablet Take 1 tablet (10 mg total) by mouth daily for 10 days. 03/20/18 03/30/18  Linzie Collin, MD  ondansetron (ZOFRAN ODT) 4 MG disintegrating tablet Take 1 tablet (4 mg total) by mouth every 8 (eight) hours as needed for up to 3 days for nausea or vomiting. 07/08/19 07/11/19  Enid Derry, PA-C    Allergies Patient has no known allergies.  Family History  Problem Relation Age of Onset  . Diabetes Mother   . COPD Father   . Hypertension Father     Social History Social History   Tobacco Use  . Smoking status: Former Games developer  . Smokeless tobacco: Never Used  Substance Use Topics  . Alcohol use: Yes  . Drug use: No     Review of Systems  Constitutional: No fever/chills Cardiovascular: No chest pain. Respiratory: No cough. No SOB. Gastrointestinal: Positive for abdominal pain.  No nausea, no vomiting. Positive for diarrhea. Genitourinary: Negative for dysuria. Musculoskeletal: Negative for musculoskeletal pain. Skin: Negative for rash, abrasions, lacerations, ecchymosis. Neurological: Positive for headaches   ____________________________________________   PHYSICAL EXAM:  VITAL SIGNS: ED Triage Vitals  Enc Vitals Group     BP 07/08/19 1001 (!) 150/93  Pulse Rate 07/08/19 0959 76     Resp 07/08/19 0959 18     Temp 07/08/19 0959 98.4 F (36.9 C)     Temp Source 07/08/19 0959 Oral     SpO2 07/08/19 0959 100 %     Weight 07/08/19 1000 200 lb (90.7 kg)     Height 07/08/19 1000 5\' 2"  (1.575 m)     Head Circumference --      Peak Flow --      Pain Score 07/08/19 0959 10     Pain Loc --      Pain Edu? --      Excl. in [place]? --       Constitutional: Alert and oriented. Well appearing and in no acute distress. Eyes: Conjunctivae are normal. PERRL. EOMI. Head: Atraumatic. ENT:      Ears:      Nose: No congestion/rhinnorhea.      Mouth/Throat: Mucous membranes are moist.  Neck: No stridor.   Cardiovascular: Normal rate, regular rhythm.  Good peripheral circulation. Respiratory: Normal respiratory effort without tachypnea or retractions. Lungs CTAB. Good air entry to the bases with no decreased or absent breath sounds. Gastrointestinal: Bowel sounds 4 quadrants.  Mild epigastric and suprapubic tenderness to palpation.. No guarding or rigidity. No palpable masses. No distention.  Musculoskeletal: Full range of motion to all extremities. No gross deformities appreciated. Neurologic:  Normal speech and language. No gross focal neurologic deficits are appreciated.  Skin:  Skin is warm, dry and intact. No rash noted. Psychiatric: Mood and affect are normal. Speech and behavior are normal. Patient exhibits appropriate insight and judgement.   ____________________________________________   LABS (all labs ordered are listed, but only abnormal results are displayed)  Labs Reviewed  COMPREHENSIVE METABOLIC PANEL - Abnormal; Notable for the following components:      Result Value   Potassium 3.4 (*)    Alkaline Phosphatase 36 (*)    All other components within normal limits  CBC - Abnormal; Notable for the following components:   Hemoglobin 11.8 (*)    Platelets 420 (*)    All other components within normal limits  URINALYSIS, COMPLETE (UACMP) WITH MICROSCOPIC - Abnormal; Notable for the following components:   Color, Urine YELLOW (*)    APPearance CLEAR (*)    All other components within normal limits  LIPASE, BLOOD  PREGNANCY, URINE   ____________________________________________  EKG   ____________________________________________  RADIOLOGY Robinette Haines, personally viewed and evaluated these images  (plain radiographs) as part of my medical decision making, as well as reviewing the written report by the radiologist.  CT ABDOMEN PELVIS W CONTRAST  Result Date: 07/08/2019 CLINICAL DATA:  One week abdominal pain.  Worsening abdominal pain. EXAM: CT ABDOMEN AND PELVIS WITH CONTRAST TECHNIQUE: Multidetector CT imaging of the abdomen and pelvis was performed using the standard protocol following bolus administration of intravenous contrast. CONTRAST:  139mL OMNIPAQUE IOHEXOL 300 MG/ML  SOLN COMPARISON:  None. FINDINGS: Lower chest: Lung bases are clear. Hepatobiliary: No focal hepatic lesion. Postcholecystectomy. No biliary dilatation. Pancreas: Pancreas is normal. No ductal dilatation. No pancreatic inflammation. Spleen: Normal spleen Adrenals/urinary tract: Adrenal glands and kidneys are normal. The ureters and bladder normal. Stomach/Bowel: Stomach, small bowel, appendix, and cecum are normal. The colon and rectosigmoid colon are normal. Vascular/Lymphatic: Abdominal aorta is normal caliber. No periportal or retroperitoneal adenopathy. No pelvic adenopathy. Reproductive: Uterus and ovaries normal. Other: No free fluid. Musculoskeletal: No aggressive osseous lesion. IMPRESSION: 1. No acute abdominal or pelvic findings.  2. Normal appendix. 3. No diverticulitis. 4. Postcholecystectomy. 5. Normal ovaries and uterus. Electronically Signed   By: Genevive Bi M.D.   On: 07/08/2019 15:00    ____________________________________________    PROCEDURES  Procedure(s) performed:    Procedures    Medications  sodium chloride flush (NS) 0.9 % injection 3 mL (3 mLs Intravenous Not Given 07/08/19 1343)  iohexol (OMNIPAQUE) 300 MG/ML solution 100 mL (100 mLs Intravenous Contrast Given 07/08/19 1435)  potassium chloride SA (KLOR-CON) CR tablet 40 mEq (40 mEq Oral Given 07/08/19 1535)     ____________________________________________   INITIAL IMPRESSION / ASSESSMENT AND PLAN / ED COURSE  Pertinent labs  & imaging results that were available during my care of the patient were reviewed by me and considered in my medical decision making (see chart for details).  Review of the [place] CSRS was performed in accordance of the [facility] prior to dispensing any controlled drugs.  Differential diagnosis includes, but is not limited to, biliary disease, gastritis, duodenitis, pancreatitis, small bowel or large bowel obstruction, abdominal aortic aneurysm, hernia, ulcer(s),ovarian cyst, ovarian torsion, acute appendicitis, diverticulitis, urinary tract infection/pyelonephritis, endometriosis, colitis, renal colic, gastroenteritis,  fibroids, endometriosis, pregnancy related pain including ectopic pregnancy, etc.   Patient presented to emergency department for evaluation of mid abdominal discomfort with diarrhea for 4 days.  Vital signs and exam are reassuring.  CT scan negative for acute abnormalities.  Patient was given Zofran and Pepcid for nausea.  Patient declines Covid test.  Patient will be discharged home with prescriptions for Zofran and Bentyl. Patient is to follow up with primary care as directed. Patient is given ED precautions to return to the ED for any worsening or new symptoms.   Latoya Benson was evaluated in Emergency Department on 07/08/2019 for the symptoms described in the history of present illness. She was evaluated in the context of the global COVID-19 pandemic, which necessitated consideration that the patient might be at risk for infection with the SARS-CoV-2 virus that causes COVID-19. Institutional protocols and algorithms that pertain to the evaluation of patients at risk for COVID-19 are in a state of rapid change based on information released by regulatory bodies including the CDC and federal and state organizations. These policies and algorithms were followed during the patient's care in the ED.  ____________________________________________  FINAL CLINICAL IMPRESSION(S) / ED  DIAGNOSES  Final diagnoses:  Viral gastroenteritis      NEW MEDICATIONS STARTED DURING THIS VISIT:  ED Discharge Orders         Ordered    dicyclomine (BENTYL) 10 MG capsule  4 times daily     07/08/19 1526    ondansetron (ZOFRAN ODT) 4 MG disintegrating tablet  Every 8 hours PRN     07/08/19 1526              This chart was dictated using voice recognition software/Dragon. Despite best efforts to proofread, errors can occur which can change the meaning. Any change was purely unintentional.    Enid Derry, PA-C 07/08/19 1539    Shaune Pollack, MD 07/09/19 [phone]

## 2019-07-08 NOTE — ED Triage Notes (Signed)
Pt states mid abd pain and diarrhea since last week, denies vomiting, NAD.

## 2019-08-01 ENCOUNTER — Ambulatory Visit: Payer: Self-pay

## 2019-08-07 ENCOUNTER — Other Ambulatory Visit: Payer: Self-pay

## 2019-08-07 ENCOUNTER — Encounter: Payer: Self-pay | Admitting: Physician Assistant

## 2019-08-07 ENCOUNTER — Ambulatory Visit (LOCAL_COMMUNITY_HEALTH_CENTER): Payer: Self-pay | Admitting: Physician Assistant

## 2019-08-07 VITALS — BP 113/76 | Ht 61.0 in | Wt 205.0 lb

## 2019-08-07 DIAGNOSIS — Z3040 Encounter for surveillance of contraceptives, unspecified: Secondary | ICD-10-CM

## 2019-08-07 DIAGNOSIS — Z6838 Body mass index (BMI) 38.0-38.9, adult: Secondary | ICD-10-CM | POA: Insufficient documentation

## 2019-08-07 DIAGNOSIS — E669 Obesity, unspecified: Secondary | ICD-10-CM | POA: Insufficient documentation

## 2019-08-07 MED ORDER — NORGESTIMATE-ETH ESTRADIOL 0.25-35 MG-MCG PO TABS: 1.0000 | ORAL_TABLET | Freq: Every day | ORAL | 12 refills | Status: DC

## 2019-08-07 NOTE — Progress Notes (Signed)
Patient prescribed 13 packs of OCP's. Dispensed 8 packs of pills today due to expiration date of 05/11/2020. Explained to patient to call and scheduled an appt to come in and pick up remainder of pills when she begins last pack of pills. Patient verbalized understanding of above. Tawny Hopping, RN

## 2019-08-07 NOTE — Progress Notes (Signed)
Here today for a Pap Smear and birth control pill refill. Has never had a PE or Pap Smear here. Tawny Hopping, RN

## 2019-08-07 NOTE — Progress Notes (Signed)
Family Planning Visit- Repeat Yearly Visit  Subjective:  Latoya Benson is a 24 y.o. being seen today for an well woman visit and to discuss family planning options.    She is currently using OCP (estrogen/progesterone) for pregnancy prevention. Patient reports she feels she is unlikely to get pregnant due to PCOS, wants to continue OCPs for cycle regulation. Patient  has Sepsis ([place]); Pyelonephritis; and Class 2 obesity with body mass index (BMI) of 38.0 to 38.9 in adult on their problem list.  Chief Complaint  Patient presents with  . Contraception    Patient reports she just wants to continue OCPs.  Patient denies vag discharge, pain, fever, migraine headaches, personal or family history of bleeding/clotting disorders/strokes.   Does the patient desire a pregnancy in the next year? (OKQ flowsheet)  See flowsheet for other program required questions.   Body mass index is 38.73 kg/m. - Patient is eligible for diabetes screening based on BMI and age >01?  not applicable VC9S ordered? no  Patient reports 1 of partners in last year. Desires STI screening?  No - low risk, single partner (husband), declines  Does the patient have a current or past history of drug use? No   No components found for: HCV]   Health Maintenance Due  Topic Date Due  . TETANUS/TDAP  07/22/2013  . PAP-Cervical Cytology Screening  07/23/2015  . PAP SMEAR-Modifier  07/23/2015  . INFLUENZA VACCINE  01/11/2019    Review of Systems  Constitutional: Negative.   HENT: Negative.   Eyes: Negative.   Respiratory: Negative.   Cardiovascular: Negative.   Gastrointestinal: Negative.   Genitourinary: Negative.   Musculoskeletal: Negative.   Skin: Negative.   Neurological: Negative.   Endo/Heme/Allergies: Negative.   Psychiatric/Behavioral: Negative.     The following portions of the patient's history were reviewed and updated as appropriate: allergies, current medications, past family history, past  medical history, past social history, past surgical history and problem list. Problem list updated.  Objective:   Vitals:   08/07/19 0917  BP: 113/76  Weight: 205 lb (93 kg)  Height: 5\' 1"  (1.549 m)    Physical Exam Constitutional:      General: She is not in acute distress.    Appearance: She is obese.  Pulmonary:     Effort: Pulmonary effort is normal.  Abdominal:     Palpations: Abdomen is soft.     Tenderness: There is no abdominal tenderness.  Genitourinary:    Pubic Area: No rash.      Labia:        Right: No rash or lesion.        Left: No rash or lesion.      Urethra: No urethral lesion.     Vagina: No vaginal discharge, bleeding or lesions.     Cervix: No discharge, lesion or erythema.     Uterus: Not tender.      Adnexa:        Right: No tenderness.         Left: No tenderness.       Rectum: No external hemorrhoid.  Lymphadenopathy:     Lower Body: No right inguinal adenopathy. No left inguinal adenopathy.  Skin:    General: Skin is warm and dry.     Comments: Tattoos noted, hirsute abdomen (shaved)  Neurological:     General: No focal deficit present.     Mental Status: She is alert and oriented to person, place, and time.  Psychiatric:  Mood and Affect: Mood normal.        Behavior: Behavior normal.        Thought Content: Thought content normal.        Judgment: Judgment normal.       Assessment and Plan:  Latoya Benson is a 24 y.o. female presenting to the [facility] Department for an initial well woman exam/family planning visit  Contraception counseling: Reviewed all forms of birth control options in the tiered based approach. available including abstinence; over the counter/barrier methods; hormonal contraceptive medication including pill, patch, ring, injection,contraceptive implant; hormonal and nonhormonal IUDs; permanent sterilization options including vasectomy and the various tubal sterilization modalities. Risks,  benefits, and typical effectiveness rates were reviewed.  Questions were answered.  Written information was also given to the patient to review.  Patient desires COCPs, this was prescribed for patient. She will follow up in  12 mo for surveillance.  She was told to call with any further questions, or with any concerns about this method of contraception.  Emphasized use of condoms 100% of the time for STI prevention.  Patient was not offered ECP.  1. Encounter for surveillance of contraceptives, unspecified contraceptive Continue COCPs (Sprintec) 1 po qd, please give 13 packs. Enc daily MVI with folic acid. Enc pt to f/u with PCP re: PCOS to explore other treatments.  2. Class 2 obesity with body mass index (BMI) of 38.0 to 38.9 in adult, unspecified obesity type, unspecified whether serious comorbidity present Enc diet/exercise. Offer nutrition referral.     Return in about 1 year (around 08/06/2020) for routine annual well-woman exam.  No future appointments.  Landry Dyke, PA-C

## 2019-08-15 ENCOUNTER — Encounter: Payer: Self-pay | Admitting: Family Medicine

## 2019-08-15 ENCOUNTER — Encounter: Payer: Self-pay | Admitting: Physician Assistant

## 2019-08-15 DIAGNOSIS — R87610 Atypical squamous cells of undetermined significance on cytologic smear of cervix (ASC-US): Secondary | ICD-10-CM | POA: Insufficient documentation

## 2019-08-15 LAB — IGP, RFX APTIMA HPV ASCU: PAP Smear Comment: 0

## 2019-08-15 LAB — HPV APTIMA: HPV Aptima: NEGATIVE

## 2019-09-23 ENCOUNTER — Ambulatory Visit: Payer: Self-pay | Admitting: Pharmacy Technician

## 2019-09-23 ENCOUNTER — Other Ambulatory Visit: Payer: Self-pay

## 2019-09-23 DIAGNOSIS — Z79899 Other long term (current) drug therapy: Secondary | ICD-10-CM

## 2019-09-23 NOTE — Progress Notes (Signed)
Provided patient with  financial assistance application for [place] due to recent hospital visit.  Patient agreed to be responsible for gathering financial information and forwarding to appropriate department in [facility].    Completed [facility] application and contract.  Patient agreed to all terms of the [facility] contract.    Patient approved to receive medication assistance at [facility] until time for re-certification in 9532, and as long as eligibility criteria continues to be met.   Provided patient with community resource material based on her particular needs.    [facility]

## 2019-09-30 ENCOUNTER — Encounter: Payer: Self-pay | Admitting: Gerontology

## 2019-09-30 ENCOUNTER — Ambulatory Visit: Payer: Self-pay | Admitting: Gerontology

## 2019-09-30 ENCOUNTER — Other Ambulatory Visit: Payer: Self-pay

## 2019-09-30 VITALS — BP 132/88 | HR 95 | Ht 62.0 in | Wt 214.0 lb

## 2019-09-30 DIAGNOSIS — Z0000 Encounter for general adult medical examination without abnormal findings: Secondary | ICD-10-CM

## 2019-09-30 DIAGNOSIS — M1A09X0 Idiopathic chronic gout, multiple sites, without tophus (tophi): Secondary | ICD-10-CM

## 2019-09-30 MED ORDER — ALLOPURINOL 100 MG PO TABS: 100.0000 mg | ORAL_TABLET | Freq: Two times a day (BID) | ORAL | 3 refills | Status: DC

## 2019-09-30 MED ORDER — ALLOPURINOL 100 MG PO TABS: 100.0000 mg | ORAL_TABLET | Freq: Two times a day (BID) | ORAL | 6 refills | Status: DC

## 2019-09-30 NOTE — Progress Notes (Signed)
Established Patient Office Visit  Subjective:  Patient ID: Latoya Benson, female    DOB: [birth year]  Age: 24 y.o. MRN: [id number]  CC:  Chief Complaint  Patient presents with  . Gout    bilateral ankles and feet    HPI Latoya Benson presents for follow up of gout to bilateral ankle and feet. She states that she's out of her Allopurinol and had a gout flare last week. She denies chest pain, palpitation, light headedness, fever and chills. Overall, she states that she's doing well and offers no further complaint.  Past Medical History:  Diagnosis Date  . Medical history non-contributory   . PCOS (polycystic ovarian syndrome)   . PCOS (polycystic ovarian syndrome)   . PCOS (polycystic ovarian syndrome) 03/2018    Past Surgical History:  Procedure Laterality Date  . CHOLECYSTECTOMY  05/22/2013  . COLON SURGERY  day after birth  . intestinal surgery at birth      Family History  Problem Relation Age of Onset  . Diabetes Mother   . COPD Father   . Hypertension Father     Social History   Socioeconomic History  . Marital status: Married    Spouse name: Not on file  . Number of children: Not on file  . Years of education: Not on file  . Highest education level: Not on file  Occupational History  . Not on file  Tobacco Use  . Smoking status: Former Research scientist (life sciences)  . Smokeless tobacco: Never Used  Substance and Sexual Activity  . Alcohol use: Yes  . Drug use: No  . Sexual activity: Yes    Birth control/protection: Other-see comments    Comment: hx of one miscarriage, used sprintec but needs refill   Other Topics Concern  . Not on file  Social History Narrative  . Not on file   Social Determinants of Health   Financial Resource Strain:   . Difficulty of Paying Living Expenses:   Food Insecurity:   . Worried About Charity fundraiser in the Last Year:   . Arboriculturist in the Last Year:   Transportation Needs:   . Film/video editor (Medical):   Marland Kitchen  Lack of Transportation (Non-Medical):   Physical Activity:   . Days of Exercise per Week:   . Minutes of Exercise per Session:   Stress:   . Feeling of Stress :   Social Connections:   . Frequency of Communication with Friends and Family:   . Frequency of Social Gatherings with Friends and Family:   . Attends Religious Services:   . Active Member of Clubs or Organizations:   . Attends Archivist Meetings:   Marland Kitchen Marital Status:   Intimate Partner Violence:   . Fear of Current or Ex-Partner:   . Emotionally Abused:   Marland Kitchen Physically Abused:   . Sexually Abused:     Outpatient Medications Prior to Visit  Medication Sig Dispense Refill  . norgestimate-ethinyl estradiol (ORTHO-CYCLEN) 0.25-35 MG-MCG tablet Take 1 tablet by mouth daily for 28 days. 1 Package 12  . allopurinol (ZYLOPRIM) 100 MG tablet Take 1 tablet (100 mg total) by mouth 2 (two) times daily. 60 tablet 0  . acetaminophen (TYLENOL) 500 MG tablet Take 2,000 mg by mouth every 6 (six) hours as needed.    . chlorpheniramine-HYDROcodone (TUSSIONEX PENNKINETIC ER) 10-8 MG/5ML SUER Take 5 mLs by mouth 2 (two) times daily. (Patient not taking: Reported on 08/07/2019) 115 mL 0  .  dicyclomine (BENTYL) 10 MG capsule Take 1 capsule (10 mg total) by mouth 4 (four) times daily for 2 days. 10 capsule 0   No facility-administered medications prior to visit.    No Known Allergies  ROS Review of Systems  Constitutional: Negative.   Respiratory: Negative.   Cardiovascular: Negative.   Musculoskeletal: Negative.   Neurological: Negative.   Psychiatric/Behavioral: Negative.       Objective:    Physical Exam  Constitutional: She is oriented to person, place, and time. She appears well-developed.  HENT:  Head: Normocephalic and atraumatic.  Cardiovascular: Normal rate and regular rhythm.  Pulmonary/Chest: Effort normal and breath sounds normal.  Musculoskeletal:        General: Normal range of motion.  Neurological: She is  alert and oriented to person, place, and time. She has normal reflexes.  Psychiatric: She has a normal mood and affect. Her behavior is normal. Judgment and thought content normal.    BP 132/88 (BP Location: Left Arm, Patient Position: Sitting)   Pulse 95   Ht _0  (1.575 m)   Wt 214 lb (97.1 kg)   SpO2 99%   BMI 39.14 kg/m  Wt Readings from Last 3 Encounters:  09/30/19 214 lb (97.1 kg)  08/07/19 205 lb (93 kg)  11/18/18 206 lb 6.4 oz (93.6 kg)   She was encouraged to continue on a weight loss regimen.  Health Maintenance Due  Topic Date Due  . COVID-19 Vaccine (1) Never done  . TETANUS/TDAP  Never done  . PAP-Cervical Cytology Screening  Never done    There are no preventive care reminders to display for this patient.  Lab Results  Component Value Date   TSH 1.710 10/31/2018   Lab Results  Component Value Date   WBC 6.3 07/08/2019   HGB 11.8 (L) 07/08/2019   HCT 36.9 07/08/2019   MCV 92.3 07/08/2019   PLT 420 (H) 07/08/2019   Lab Results  Component Value Date   NA 138 07/08/2019   K 3.4 (L) 07/08/2019   CO2 23 07/08/2019   GLUCOSE 95 07/08/2019   BUN 7 07/08/2019   CREATININE 0.85 07/08/2019   BILITOT 0.6 07/08/2019   ALKPHOS 36 (L) 07/08/2019   AST 20 07/08/2019   ALT 21 07/08/2019   PROT 8.1 07/08/2019   ALBUMIN 4.0 07/08/2019   CALCIUM 9.3 07/08/2019   ANIONGAP 9 07/08/2019   Lab Results  Component Value Date   CHOL 149 10/31/2018   Lab Results  Component Value Date   HDL 30 (L) 10/31/2018   Lab Results  Component Value Date   LDLCALC 73 10/31/2018   Lab Results  Component Value Date   TRIG 229 (H) 10/31/2018   Lab Results  Component Value Date   CHOLHDL 5.0 (H) 10/31/2018   Lab Results  Component Value Date   HGBA1C 5.5 10/31/2018      Assessment & Plan:    1. Chronic gout of multiple sites, unspecified cause - She will continue on current medication regimen and advised to avoid purine diet. - allopurinol (ZYLOPRIM) 100 MG  tablet; Take 1 tablet (100 mg total) by mouth 2 (two) times daily.  Dispense: 60 tablet; Refill: 6  2. Health maintenance examination - Routine labs will be checked. - CBC w/Diff; Future - Comp Met (CMET); Future - Lipid panel; Future - HgB A1c; Future - Urinalysis; Future    Follow-up: Return in about 6 months (around 03/31/2020), or if symptoms worsen or fail to improve.  Lashonda Sonneborn Jerold Coombe, NP

## 2019-09-30 NOTE — Patient Instructions (Signed)
 Gout  Gout is painful swelling of your joints. Gout is a type of arthritis. It is caused by having too much uric acid in your body. Uric acid is a chemical that is made when your body breaks down substances called purines. If your body has too much uric acid, sharp crystals can form and build up in your joints. This causes pain and swelling. Gout attacks can happen quickly and be very painful (acute gout). Over time, the attacks can affect more joints and happen more often (chronic gout). What are the causes?  Too much uric acid in your blood. This can happen because: ? Your kidneys do not remove enough uric acid from your blood. ? Your body makes too much uric acid. ? You eat too many foods that are high in purines. These foods include organ meats, some seafood, and beer.  Trauma or stress. What increases the risk?  Having a family history of gout.  Being female and middle-aged.  Being female and having gone through menopause.  Being very overweight (obese).  Drinking alcohol, especially beer.  Not having enough water in the body (being dehydrated).  Losing weight too quickly.  Having an organ transplant.  Having lead poisoning.  Taking certain medicines.  Having kidney disease.  Having a skin condition called psoriasis. What are the signs or symptoms? An attack of acute gout usually happens in just one joint. The most common place is the big toe. Attacks often start at night. Other joints that may be affected include joints of the feet, ankle, knee, fingers, wrist, or elbow. Symptoms of an attack may include:  Very bad pain.  Warmth.  Swelling.  Stiffness.  Shiny, red, or purple skin.  Tenderness. The affected joint may be very painful to touch.  Chills and fever. Chronic gout may cause symptoms more often. More joints may be involved. You may also have white or yellow lumps (tophi) on your hands or feet or in other areas near your joints. How is this  treated?  Treatment for this condition has two phases: treating an acute attack and preventing future attacks.  Acute gout treatment may include: ? NSAIDs. ? Steroids. These are taken by mouth or injected into a joint. ? Colchicine. This medicine relieves pain and swelling. It can be given by mouth or through an IV tube.  Preventive treatment may include: ? Taking small doses of NSAIDs or colchicine daily. ? Using a medicine that reduces uric acid levels in your blood. ? Making changes to your diet. You may need to see a food expert (dietitian) about what to eat and drink to prevent gout. Follow these instructions at home: During a gout attack   If told, put ice on the painful area: ? Put ice in a plastic bag. ? Place a towel between your skin and the bag. ? Leave the ice on for 20 minutes, 2-3 times a day.  Raise (elevate) the painful joint above the level of your heart as often as you can.  Rest the joint as much as possible. If the joint is in your leg, you may be given crutches.  Follow instructions from your doctor about what you cannot eat or drink. Avoiding future gout attacks  Eat a low-purine diet. Avoid foods and drinks such as: ? Liver. ? Kidney. ? Anchovies. ? Asparagus. ? Herring. ? Mushrooms. ? Mussels. ? Beer.  Stay at a healthy weight. If you want to lose weight, talk with your doctor. Do not lose   weight too fast.  Start or continue an exercise plan as told by your doctor. Eating and drinking  Drink enough fluids to keep your pee (urine) pale yellow.  If you drink alcohol: ? Limit how much you use to:  0-1 drink a day for women.  0-2 drinks a day for men. ? Be aware of how much alcohol is in your drink. In the [place] one drink equals one 12 oz bottle of beer (355 mL), one 5 oz glass of wine (148 mL), or one 1 oz glass of hard liquor (44 mL). General instructions  Take over-the-counter and prescription medicines only as told by your doctor.  Do  not drive or use heavy machinery while taking prescription pain medicine.  Return to your normal activities as told by your doctor. Ask your doctor what activities are safe for you.  Keep all follow-up visits as told by your doctor. This is important. Contact a doctor if:  You have another gout attack.  You still have symptoms of a gout attack after 10 days of treatment.  You have problems (side effects) because of your medicines.  You have chills or a fever.  You have burning pain when you pee (urinate).  You have pain in your lower back or belly. Get help right away if:  You have very bad pain.  Your pain cannot be controlled.  You cannot pee. Summary  Gout is painful swelling of the joints.  The most common site of pain is the big toe, but it can affect other joints.  Medicines and avoiding some foods can help to prevent and treat gout attacks. This information is not intended to replace advice given to you by your health care provider. Make sure you discuss any questions you have with your health care provider. Document Revised: 12/19/2017 Document Reviewed: 12/19/2017 Elsevier Patient Education  2020 [employer]  

## 2019-11-05 NOTE — Telephone Encounter (Signed)
NA

## 2020-03-01 ENCOUNTER — Other Ambulatory Visit: Payer: Self-pay

## 2020-03-04 ENCOUNTER — Ambulatory Visit: Payer: Self-pay | Admitting: Gerontology

## 2020-03-24 ENCOUNTER — Other Ambulatory Visit: Payer: Self-pay

## 2020-03-24 DIAGNOSIS — Z0000 Encounter for general adult medical examination without abnormal findings: Secondary | ICD-10-CM

## 2020-03-25 LAB — HEMOGLOBIN A1C
Est. average glucose Bld gHb Est-mCnc: 117 mg/dL
Hgb A1c MFr Bld: 5.7 % — ABNORMAL HIGH (ref 4.8–5.6)

## 2020-03-25 LAB — URINALYSIS
Bilirubin, UA: NEGATIVE
Glucose, UA: NEGATIVE
Ketones, UA: NEGATIVE
Leukocytes,UA: NEGATIVE
Nitrite, UA: NEGATIVE
Protein,UA: NEGATIVE
RBC, UA: NEGATIVE
Specific Gravity, UA: 1.015 (ref 1.005–1.030)
Urobilinogen, Ur: 0.2 mg/dL (ref 0.2–1.0)
pH, UA: 5 (ref 5.0–7.5)

## 2020-03-25 LAB — CBC WITH DIFFERENTIAL/PLATELET
Basophils Absolute: 0.1 10*3/uL (ref 0.0–0.2)
Basos: 1 %
EOS (ABSOLUTE): 0.2 10*3/uL (ref 0.0–0.4)
Eos: 3 %
Hematocrit: 34.8 % (ref 34.0–46.6)
Hemoglobin: 11.7 g/dL (ref 11.1–15.9)
Immature Grans (Abs): 0 10*3/uL (ref 0.0–0.1)
Immature Granulocytes: 0 %
Lymphocytes Absolute: 2.2 10*3/uL (ref 0.7–3.1)
Lymphs: 34 %
MCH: 30.5 pg (ref 26.6–33.0)
MCHC: 33.6 g/dL (ref 31.5–35.7)
MCV: 91 fL (ref 79–97)
Monocytes Absolute: 0.4 10*3/uL (ref 0.1–0.9)
Monocytes: 7 %
Neutrophils Absolute: 3.6 10*3/uL (ref 1.4–7.0)
Neutrophils: 55 %
Platelets: 466 10*3/uL — ABNORMAL HIGH (ref 150–450)
RBC: 3.83 x10E6/uL (ref 3.77–5.28)
RDW: 13.1 % (ref 11.7–15.4)
WBC: 6.4 10*3/uL (ref 3.4–10.8)

## 2020-03-25 LAB — COMPREHENSIVE METABOLIC PANEL
ALT: 24 IU/L (ref 0–32)
AST: 29 IU/L (ref 0–40)
Albumin/Globulin Ratio: 1.5 (ref 1.2–2.2)
Albumin: 4.5 g/dL (ref 3.9–5.0)
Alkaline Phosphatase: 50 IU/L (ref 44–121)
BUN/Creatinine Ratio: 12 (ref 9–23)
BUN: 10 mg/dL (ref 6–20)
Bilirubin Total: <0.2 mg/dL (ref 0.0–1.2)
CO2: 15 mmol/L — ABNORMAL LOW (ref 20–29)
Calcium: 9.8 mg/dL (ref 8.7–10.2)
Chloride: 106 mmol/L (ref 96–106)
Creatinine, Ser: 0.85 mg/dL (ref 0.57–1.00)
GFR calc Af Amer: 110 mL/min/{1.73_m2} (ref >59)
GFR calc non Af Amer: 96 mL/min/{1.73_m2} (ref >59)
Globulin, Total: 3 g/dL (ref 1.5–4.5)
Glucose: 92 mg/dL (ref 65–99)
Potassium: 4.9 mmol/L (ref 3.5–5.2)
Sodium: 141 mmol/L (ref 134–144)
Total Protein: 7.5 g/dL (ref 6.0–8.5)

## 2020-03-25 LAB — LIPID PANEL
Chol/HDL Ratio: 4.4 ratio (ref 0.0–4.4)
Cholesterol, Total: 186 mg/dL (ref 100–199)
HDL: 42 mg/dL
LDL Chol Calc (NIH): 97 mg/dL (ref 0–99)
Triglycerides: 279 mg/dL — ABNORMAL HIGH (ref 0–149)
VLDL Cholesterol Cal: 47 mg/dL — ABNORMAL HIGH (ref 5–40)

## 2020-04-07 ENCOUNTER — Ambulatory Visit: Payer: Self-pay | Admitting: Gerontology

## 2020-04-07 ENCOUNTER — Other Ambulatory Visit: Payer: Self-pay

## 2020-04-07 DIAGNOSIS — F331 Major depressive disorder, recurrent, moderate: Secondary | ICD-10-CM

## 2020-04-07 DIAGNOSIS — H5213 Myopia, bilateral: Secondary | ICD-10-CM

## 2020-04-07 DIAGNOSIS — M109 Gout, unspecified: Secondary | ICD-10-CM

## 2020-04-07 DIAGNOSIS — E782 Mixed hyperlipidemia: Secondary | ICD-10-CM

## 2020-04-07 DIAGNOSIS — F411 Generalized anxiety disorder: Secondary | ICD-10-CM

## 2020-04-07 DIAGNOSIS — M1A09X0 Idiopathic chronic gout, multiple sites, without tophus (tophi): Secondary | ICD-10-CM

## 2020-04-07 MED ORDER — CITALOPRAM HYDROBROMIDE 20 MG PO TABS: 20.0000 mg | ORAL_TABLET | Freq: Every day | ORAL | 2 refills | Status: DC

## 2020-04-07 NOTE — Progress Notes (Signed)
Patient is here for a follow-up appointment from appt on 10/13, which was a lab work appointment. Pt saw some results on her labs that she was not comfortable with and wants to discuss.

## 2020-04-07 NOTE — Progress Notes (Signed)
Established Patient Office Visit  Subjective:  Patient ID: Latoya PearsonStephanie L Malia, female    DOB: July 30, 1995  Age: 24 y.o. MRN: [id number]  CC:  Chief Complaint  Patient presents with  . Follow-up  and labs review.  HPI  Latoya PearsonStephanie L Benson presents for routine f/u for obesity, gout, depression, anxiety and labs review. She tried loosing weight using the keto diet but that raised her lipids. She has stopped the keto diet. She has only regained 1 lb. Her lipid panel was normal except for her triglyceride level. She has not had any gout flare up in a while. She reports feeling depressed and "always on the edge". She she reports having multiple anger outburst.  She is requesting to be started on something for depression.  She is not currently under the care of a mental health counselor.  She denies suicidal ideations or homicidal ideations.  She has a history of polycystic ovarian syndrome.  [facility].  No n other symptoms reported. She has a h/o of myopia but has not been able to see an ophthalmologist for over a year now. She needs glasses as well  Past Medical History:  Diagnosis Date  . Medical history non-contributory   . PCOS (polycystic ovarian syndrome)   . PCOS (polycystic ovarian syndrome)   . PCOS (polycystic ovarian syndrome) 03/2018    Past Surgical History:  Procedure Laterality Date  . CHOLECYSTECTOMY  05/22/2013  . COLON SURGERY  day after birth  . intestinal surgery at birth      Family History  Problem Relation Age of Onset  . Diabetes Mother   . COPD Father   . Hypertension Father     Social History   Socioeconomic History  . Marital status: Married    Spouse name: Not on file  . Number of children: Not on file  . Years of education: Not on file  . Highest education level: Not on file  Occupational History  . Not on file  Tobacco Use  . Smoking status: Former Games developermoker  . Smokeless tobacco: Never Used  Substance and Sexual Activity   . Alcohol use: Yes  . Drug use: No  . Sexual activity: Yes    Birth control/protection: Other-see comments    Comment: hx of one miscarriage, used sprintec but needs refill   Other Topics Concern  . Not on file  Social History Narrative  . Not on file   Social Determinants of Health   Financial Resource Strain:   . Difficulty of Paying Living Expenses: Not on file  Food Insecurity:   . Worried About Programme researcher, broadcasting/film/videounning Out of Food in the Last Year: Not on file  . Ran Out of Food in the Last Year: Not on file  Transportation Needs:   . Lack of Transportation (Medical): Not on file  . Lack of Transportation (Non-Medical): Not on file  Physical Activity:   . Days of Exercise per Week: Not on file  . Minutes of Exercise per Session: Not on file  Stress:   . Feeling of Stress : Not on file  Social Connections:   . Frequency of Communication with Friends and Family: Not on file  . Frequency of Social Gatherings with Friends and Family: Not on file  . Attends Religious Services: Not on file  . Active Member of Clubs or Organizations: Not on file  . Attends BankerClub or Organization Meetings: Not on file  . Marital Status: Not on file  Intimate Partner Violence:   . Fear of Current or Ex-Partner: Not on file  . Emotionally Abused: Not on file  . Physically Abused: Not on file  . Sexually Abused: Not on file    Outpatient Medications Prior to Visit  Medication Sig Dispense Refill  . allopurinol (ZYLOPRIM) 100 MG tablet Take 1 tablet (100 mg total) by mouth 2 (two) times daily. 60 tablet 6  . norgestimate-ethinyl estradiol (ORTHO-CYCLEN) 0.25-35 MG-MCG tablet Take 1 tablet by mouth daily for 28 days. 1 Package 12   No facility-administered medications prior to visit.    No Known Allergies  ROS Review of Systems  Constitutional: Negative.   HENT: Negative.   Respiratory: Negative.   Cardiovascular: Negative.   Gastrointestinal: Negative.   Endocrine: Negative for polydipsia, polyphagia  and polyuria.  Genitourinary: Negative.   Musculoskeletal: Negative.   Skin: Negative.   Psychiatric/Behavioral: Positive for dysphoric mood. Negative for self-injury, sleep disturbance and suicidal ideas.      Objective:    Physical Exam Vitals and nursing note reviewed.  Constitutional:      Appearance: Normal appearance. She is obese.  HENT:     Head: Normocephalic and atraumatic.     Nose: Nose normal.     Mouth/Throat:     Mouth: Mucous membranes are moist.     Pharynx: Oropharynx is clear.  Eyes:     Extraocular Movements: Extraocular movements intact.     Conjunctiva/sclera: Conjunctivae normal.     Pupils: Pupils are equal, round, and reactive to light.  Cardiovascular:     Rate and Rhythm: Normal rate and regular rhythm.     Pulses: Normal pulses.     Heart sounds: Normal heart sounds.  Pulmonary:     Effort: Pulmonary effort is normal.     Breath sounds: Normal breath sounds.  Abdominal:     General: Abdomen is flat. Bowel sounds are normal.     Comments: Central obesity  Musculoskeletal:        General: Normal range of motion.     Cervical back: Normal range of motion and neck supple.  Skin:    General: Skin is warm and dry.     Capillary Refill: Capillary refill takes less than 2 seconds.  Neurological:     General: No focal deficit present.     Mental Status: She is alert and oriented to person, place, and time.  Psychiatric:     Comments: Mood is depressed, tearful during exam     BP 108/68 (BP Location: Right Arm, Patient Position: Sitting, Cuff Size: Normal)   Pulse 60   Temp 98.3 F (36.8 C)   Resp 10  Wt Readings from Last 3 Encounters:  09/30/19 214 lb (97.1 kg)  08/07/19 205 lb (93 kg)  11/18/18 206 lb 6.4 oz (93.6 kg)     Health Maintenance Due  Topic Date Due  . Hepatitis C Screening  Never done  . COVID-19 Vaccine (1) Never done  . TETANUS/TDAP  Never done  . PAP-Cervical Cytology Screening  Never done  . INFLUENZA VACCINE   01/11/2020    There are no preventive care reminders to display for this patient.  Lab Results  Component Value Date   TSH 1.710 10/31/2018   Lab Results  Component Value Date   WBC 6.4 03/24/2020   HGB 11.7 03/24/2020   HCT 34.8 03/24/2020   MCV 91 03/24/2020   PLT 466 (H) 03/24/2020   Lab Results  Component Value Date  NA 141 03/24/2020   K 4.9 03/24/2020   CO2 15 (L) 03/24/2020   GLUCOSE 92 03/24/2020   BUN 10 03/24/2020   CREATININE 0.85 03/24/2020   BILITOT <0.2 03/24/2020   ALKPHOS 50 03/24/2020   AST 29 03/24/2020   ALT 24 03/24/2020   PROT 7.5 03/24/2020   ALBUMIN 4.5 03/24/2020   CALCIUM 9.8 03/24/2020   ANIONGAP 9 07/08/2019   Lab Results  Component Value Date   CHOL 186 03/24/2020   Lab Results  Component Value Date   HDL 42 03/24/2020   Lab Results  Component Value Date   LDLCALC 97 03/24/2020   Lab Results  Component Value Date   TRIG 279 (H) 03/24/2020   Lab Results  Component Value Date   CHOLHDL 4.4 03/24/2020   Lab Results  Component Value Date   HGBA1C 5.7 (H) 03/24/2020      Assessment & Plan:   1. Chronic gout of multiple sites, unspecified cause Continue allopurinol as scheduled.  Patient encouraged to drink lots of water and to monitor for any acute flareup.  Dietary restrictions reviewed at length with patient.  She expresses understanding.  2. Morbid obesity (HCC) This is part of the polycystic ovarian syndrome.  Patient has been encouraged to initiate lifestyle changes that are sustainable.  I reviewed at length with her calorie restriction fat and sugar restriction as well as daily exercise activities that she can do indoors.  We will obtain a TSH level.  I have asked her to continue follow-up for her polycystic ovarian syndrome with OB/GYN as well as endocrinology.  She is currently not under the care of an endocrinologist.  Will initiate referral as well. - TSH  3. Acute gout of left foot, unspecified cause Acute  exacerbation resolved.  Continue maintenance medications.  4. Generalized anxiety disorder We will start patient on a low-dose of Celexa at bedtime.  Risk and benefits of medication reviewed at length with patient.  She expresses understanding.  She has been advised to return to the clinic in 4 weeks for reevaluation.  Relaxation techniques reviewed with patient.  5. Moderate episode of recurrent major depressive disorder (HCC) She is not currently on any medications.  Given that this is a persistent problem, we will start her on a low-dose antidepressant at bedtime.  She will start Celexa 20 mg at bedtime.  We will reevaluate in 4 weeks.  Lifestyle modification as well as daily exercise reviewed with patient.  Deep breathing and relaxation techniques also reviewed.  Sleep hygiene and support resources have been reviewed with patient as well.  She has been [facility].  6. Myopia of both eyes She currently needs glasses.  Will refer to ophthalmology. - Ambulatory referral to Ophthalmology  7. Moderate mixed hyperlipidemia not requiring statin therapy Even though her lipid panel is abnormal, at this point I will not start her on a statin.  I will treat her depression and encouraged her to make lifestyle changes.  If the lifestyle changes improve her lipid panel then we will continue with that.  However if her lipid panel appears worse during the next check then we will discuss the risk and benefits of a statin and probably start her on it.   Problem List Items Addressed This Visit    None    Visit Diagnoses    Chronic gout of multiple sites, unspecified cause    -  Primary   Relevant Orders   Hemoglobin A1c  UA/M w/rflx Culture, Routine      No orders of the defined types were placed in this encounter.   Follow-up: 4 weeks   Shawn Route, NP

## 2020-04-08 LAB — TSH: TSH: 1.4 u[IU]/mL (ref 0.450–4.500)

## 2020-04-14 ENCOUNTER — Encounter: Payer: Self-pay | Admitting: Gerontology

## 2020-05-05 ENCOUNTER — Ambulatory Visit: Payer: Self-pay | Admitting: Gerontology

## 2020-05-05 ENCOUNTER — Other Ambulatory Visit: Payer: Self-pay

## 2020-05-05 VITALS — BP 129/72 | HR 89 | Temp 98.1°F | Resp 16 | Wt 212.9 lb

## 2020-05-05 DIAGNOSIS — Z8659 Personal history of other mental and behavioral disorders: Secondary | ICD-10-CM

## 2020-05-05 DIAGNOSIS — Z8742 Personal history of other diseases of the female genital tract: Secondary | ICD-10-CM

## 2020-05-05 DIAGNOSIS — H5213 Myopia, bilateral: Secondary | ICD-10-CM | POA: Insufficient documentation

## 2020-05-05 DIAGNOSIS — R7303 Prediabetes: Secondary | ICD-10-CM

## 2020-05-05 DIAGNOSIS — E669 Obesity, unspecified: Secondary | ICD-10-CM

## 2020-05-05 DIAGNOSIS — Z6838 Body mass index (BMI) 38.0-38.9, adult: Secondary | ICD-10-CM

## 2020-05-05 NOTE — Patient Instructions (Signed)

## 2020-05-05 NOTE — Progress Notes (Signed)
Established Patient Office Visit  Subjective:  Patient ID: Latoya Benson, female    DOB: [birth year]  Age: 24 y.o. MRN: [id number]  CC: No chief complaint on file.   HPI Latoya Benson presents for for F/U for obesity, gout, depression, and labs review. She states that she's compliant with her medication and continues to make healthy lifestyle changes. She reports that her mood has improved since she started taking 20 mg Celexa at bedtime. She denies suicidal nor homicidal ideation. She also states that she's yet to see an Ob/Gyn with regards to her Polycystic Ovarian Syndrome. She will follow up with [facility] in January, but she denies blurry vision, pain and discharge. Her HgbA1c done on 03/24/2020 was 5.7%. She denies any gout flare, chest pain, palpitation, light headedness and shortness of breath. Overall, she states that she's doing well and offers no further complaint.     Past Medical History:  Diagnosis Date  . Medical history non-contributory   . PCOS (polycystic ovarian syndrome)   . PCOS (polycystic ovarian syndrome)   . PCOS (polycystic ovarian syndrome) 03/2018    Past Surgical History:  Procedure Laterality Date  . CHOLECYSTECTOMY  05/22/2013  . COLON SURGERY  day after birth  . intestinal surgery at birth      Family History  Problem Relation Age of Onset  . Diabetes Mother   . COPD Father   . Hypertension Father     Social History   Socioeconomic History  . Marital status: Married    Spouse name: Not on file  . Number of children: Not on file  . Years of education: Not on file  . Highest education level: Not on file  Occupational History  . Not on file  Tobacco Use  . Smoking status: Former Games developer  . Smokeless tobacco: Never Used  Substance and Sexual Activity  . Alcohol use: Yes  . Drug use: No  . Sexual activity: Yes    Birth control/protection: Other-see comments    Comment: hx of one miscarriage, used sprintec but needs  refill   Other Topics Concern  . Not on file  Social History Narrative  . Not on file   Social Determinants of Health   Financial Resource Strain: High Risk  . Difficulty of Paying Living Expenses: Very hard  Food Insecurity: No Food Insecurity  . Worried About Programme researcher, broadcasting/film/video in the Last Year: Never true  . Ran Out of Food in the Last Year: Never true  Transportation Needs: No Transportation Needs  . Lack of Transportation (Medical): No  . Lack of Transportation (Non-Medical): No  Physical Activity: Sufficiently Active  . Days of Exercise per Week: 7 days  . Minutes of Exercise per Session: 30 min  Stress: Stress Concern Present  . Feeling of Stress : Very much  Social Connections: Moderately Isolated  . Frequency of Communication with Friends and Family: More than three times a week  . Frequency of Social Gatherings with Friends and Family: Twice a week  . Attends Religious Services: Never  . Active Member of Clubs or Organizations: No  . Attends Banker Meetings: Never  . Marital Status: Married  Catering manager Violence: Not At Risk  . Fear of Current or Ex-Partner: No  . Emotionally Abused: No  . Physically Abused: No  . Sexually Abused: No    Outpatient Medications Prior to Visit  Medication Sig Dispense Refill  . allopurinol (ZYLOPRIM) 100 MG tablet Take 1 tablet (  100 mg total) by mouth 2 (two) times daily. 60 tablet 6  . citalopram (CELEXA) 20 MG tablet Take 1 tablet (20 mg total) by mouth daily. 30 tablet 2  . norgestimate-ethinyl estradiol (ORTHO-CYCLEN) 0.25-35 MG-MCG tablet Take 1 tablet by mouth daily for 28 days. 1 Package 12   No facility-administered medications prior to visit.    No Known Allergies  ROS Review of Systems  Constitutional: Negative.   Eyes: Negative for photophobia, pain and visual disturbance.  Respiratory: Negative.   Cardiovascular: Negative.   Neurological: Negative.   Psychiatric/Behavioral: Negative.        Objective:    Physical Exam HENT:     Head: Normocephalic and atraumatic.  Cardiovascular:     Rate and Rhythm: Normal rate and regular rhythm.     Pulses: Normal pulses.     Heart sounds: Normal heart sounds.  Pulmonary:     Effort: Pulmonary effort is normal.     Breath sounds: Normal breath sounds.  Skin:    General: Skin is warm.  Neurological:     General: No focal deficit present.     Mental Status: She is alert and oriented to person, place, and time. Mental status is at baseline.  Psychiatric:        Mood and Affect: Mood normal.        Behavior: Behavior normal.        Thought Content: Thought content normal.        Judgment: Judgment normal.     BP 129/72 (BP Location: Left Arm, Patient Position: Sitting, Cuff Size: Large)   Pulse 89   Temp 98.1 F (36.7 C)   Resp 16   Wt 212 lb 14.4 oz (96.6 kg)   SpO2 99%   BMI 38.94 kg/m  Wt Readings from Last 3 Encounters:  05/05/20 212 lb 14.4 oz (96.6 kg)  04/07/20 209 lb 1.6 oz (94.8 kg)  09/30/19 214 lb (97.1 kg)   She gained 3 pounds in 1 month and was encouraged to loose weight.  Health Maintenance Due  Topic Date Due  . Hepatitis C Screening  Never done  . COVID-19 Vaccine (1) Never done  . TETANUS/TDAP  Never done  . PAP-Cervical Cytology Screening  Never done    There are no preventive care reminders to display for this patient.  Lab Results  Component Value Date   TSH 1.400 04/07/2020   Lab Results  Component Value Date   WBC 6.4 03/24/2020   HGB 11.7 03/24/2020   HCT 34.8 03/24/2020   MCV 91 03/24/2020   PLT 466 (H) 03/24/2020   Lab Results  Component Value Date   NA 141 03/24/2020   K 4.9 03/24/2020   CO2 15 (L) 03/24/2020   GLUCOSE 92 03/24/2020   BUN 10 03/24/2020   CREATININE 0.85 03/24/2020   BILITOT <0.2 03/24/2020   ALKPHOS 50 03/24/2020   AST 29 03/24/2020   ALT 24 03/24/2020   PROT 7.5 03/24/2020   ALBUMIN 4.5 03/24/2020   CALCIUM 9.8 03/24/2020   ANIONGAP 9 07/08/2019    Lab Results  Component Value Date   CHOL 186 03/24/2020   Lab Results  Component Value Date   HDL 42 03/24/2020   Lab Results  Component Value Date   LDLCALC 97 03/24/2020   Lab Results  Component Value Date   TRIG 279 (H) 03/24/2020   Lab Results  Component Value Date   CHOLHDL 4.4 03/24/2020   Lab Results  Component Value  Date   HGBA1C 5.7 (H) 03/24/2020      Assessment & Plan:    1. Class 2 obesity with body mass index (BMI) of 38.0 to 38.9 in adult, unspecified obesity type, unspecified whether serious comorbidity present - She was encouraged to continue on her weight loss regimen, and calorie count.  2. History of PCOS -She was advised to complete [facility] financial application for  - Ambulatory referral to Obstetrics / Gynecology  3. Prediabetes - Her HgbA1c was 5.7% and was encouraged to continue on low carb/non concentrated sweet diet and exercise as tolerated.  4. Myopia of both eyes -She will follow up with [facility]ologist in January, was advised to go to the ED for worsening symptoms.  5. History of depression - She will continue on Celexa 20 mg and will follow up with [facility] team. She was advised to call Crisis help line or go to the ED with worsening symptoms.     Follow-up: Return in about 11 weeks (around 07/21/2020), or if symptoms worsen or fail to improve.    Nayib Remer Trellis Paganini, NP

## 2020-05-13 ENCOUNTER — Telehealth: Payer: Self-pay | Admitting: Gerontology

## 2020-05-13 ENCOUNTER — Ambulatory Visit: Payer: Self-pay

## 2020-05-25 ENCOUNTER — Encounter: Payer: Self-pay | Admitting: Obstetrics and Gynecology

## 2020-05-25 ENCOUNTER — Ambulatory Visit: Payer: Self-pay | Admitting: Licensed Clinical Social Worker

## 2020-06-02 ENCOUNTER — Encounter: Payer: Self-pay | Admitting: Obstetrics and Gynecology

## 2020-06-12 IMAGING — DX DG FOOT COMPLETE 3+V*L*
3 series · 3 of 3 positions shown · non-contrast
Comparison: January 27, 2017

CLINICAL DATA: Pain and swelling

EXAM:
LEFT FOOT - COMPLETE 3+ VIEW

[foot ap]
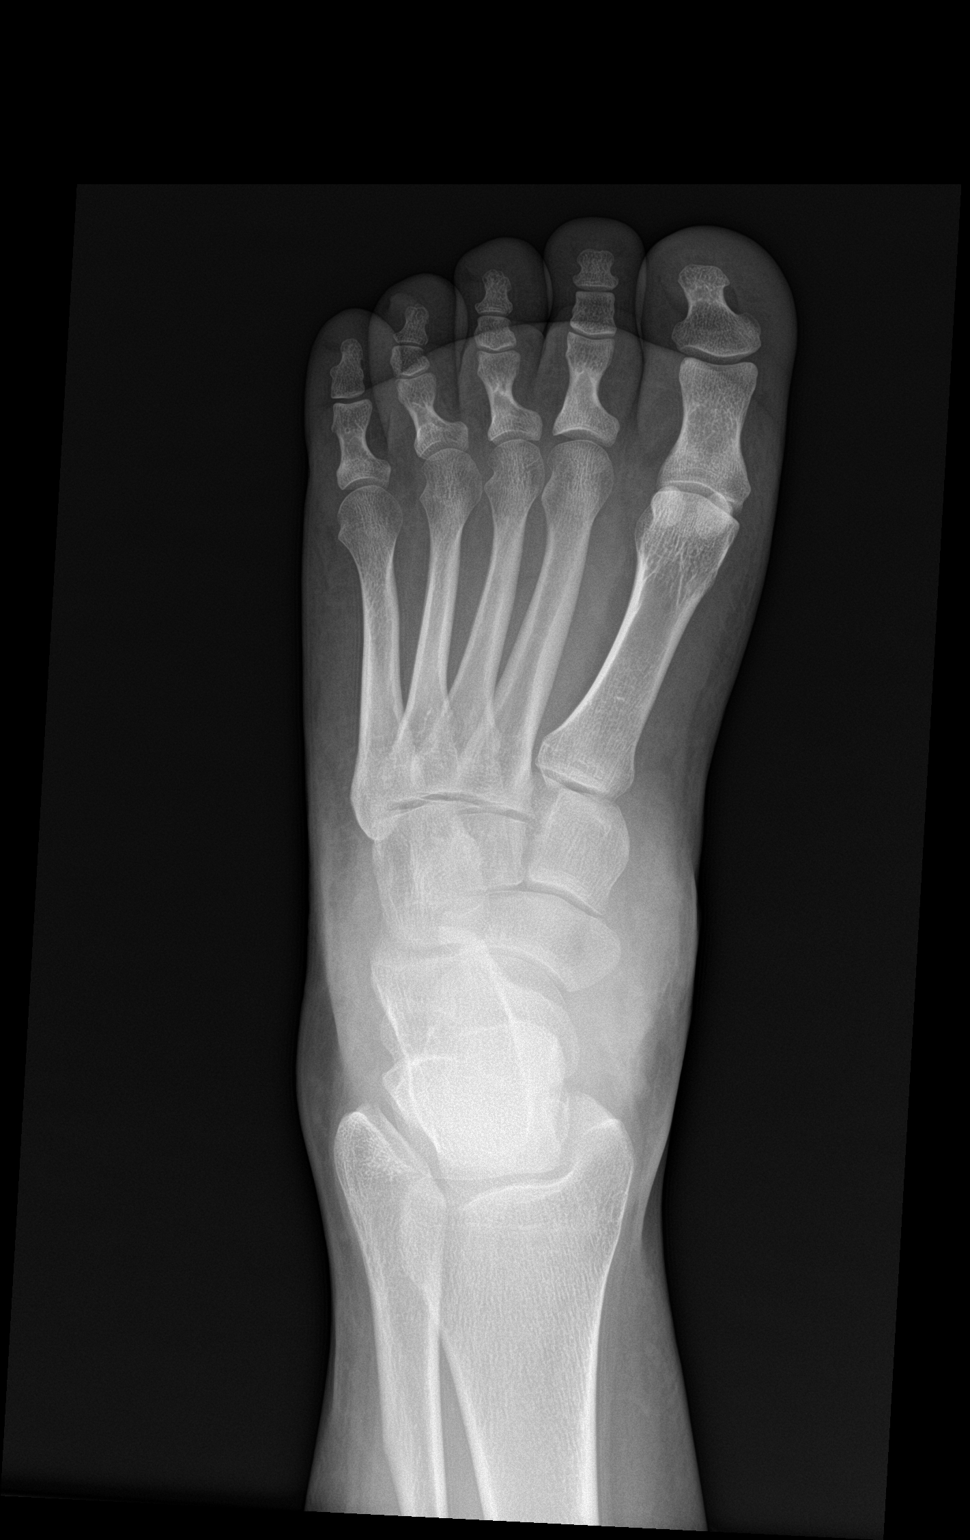

[foot obl]
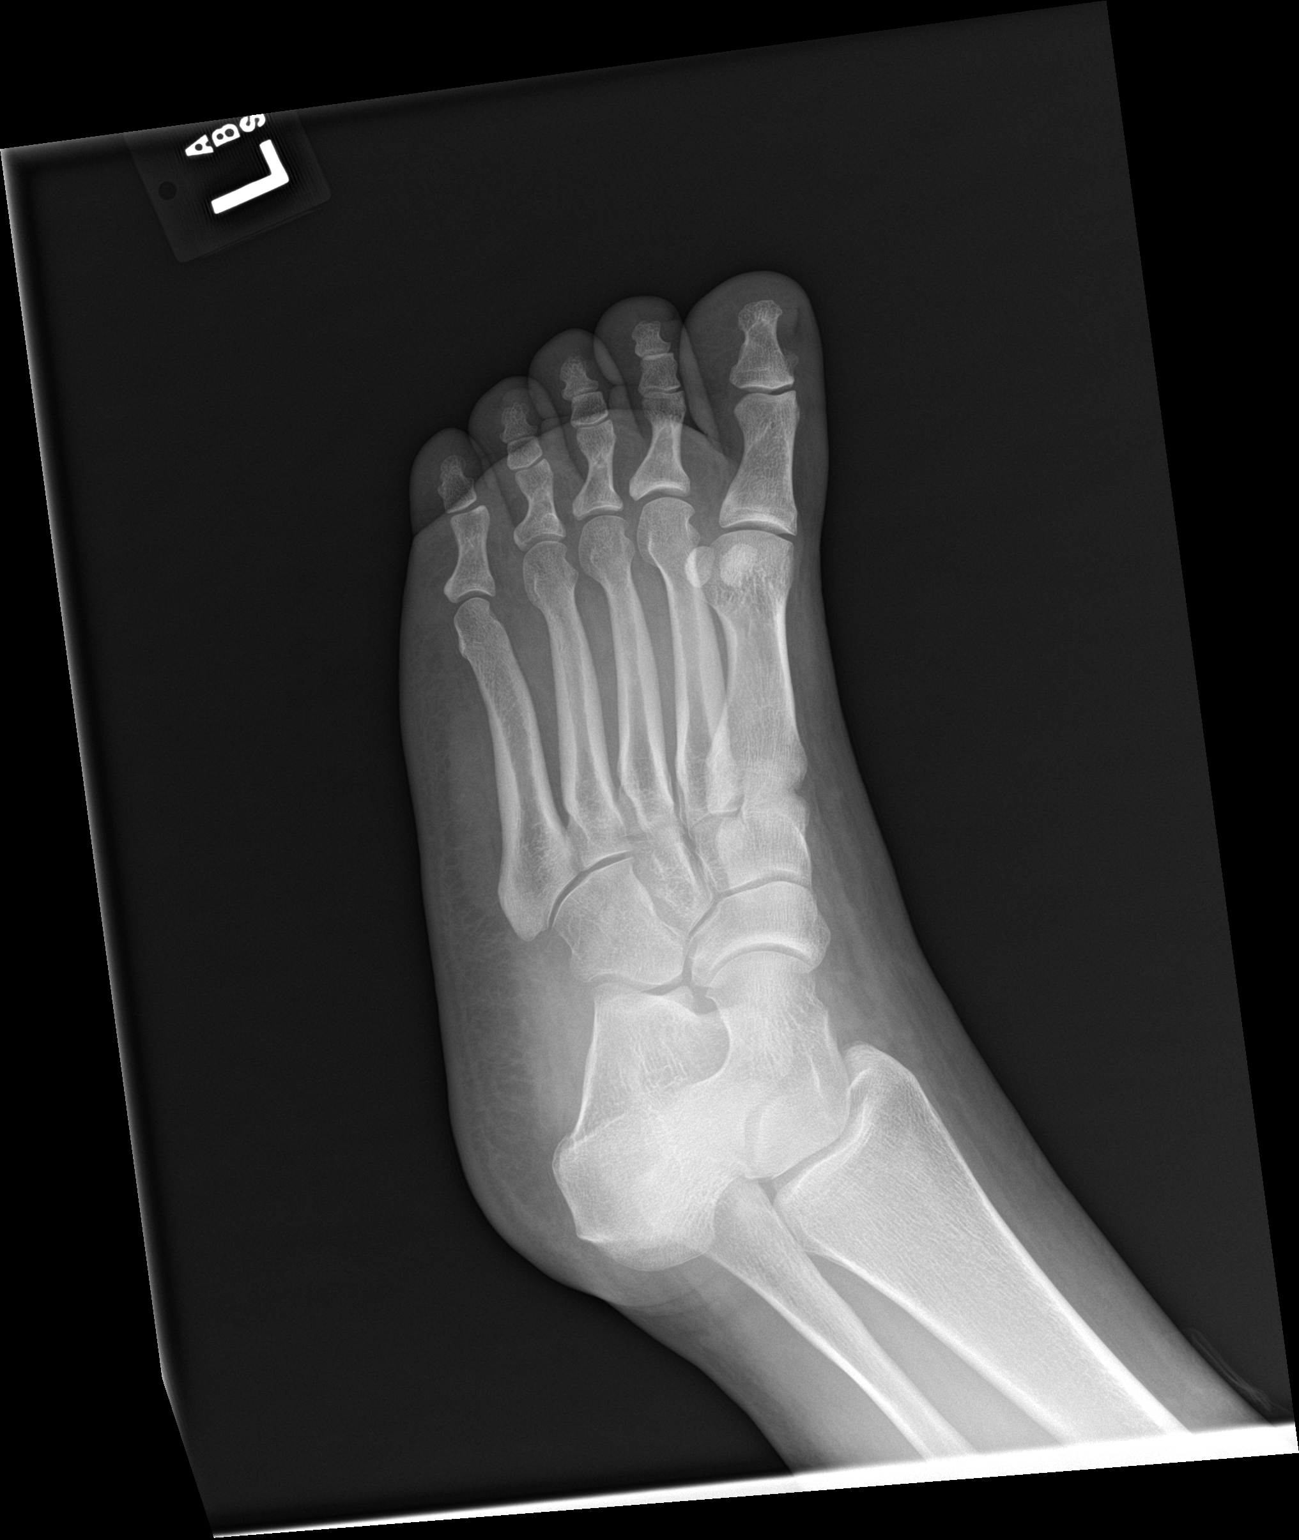

[foot lat]
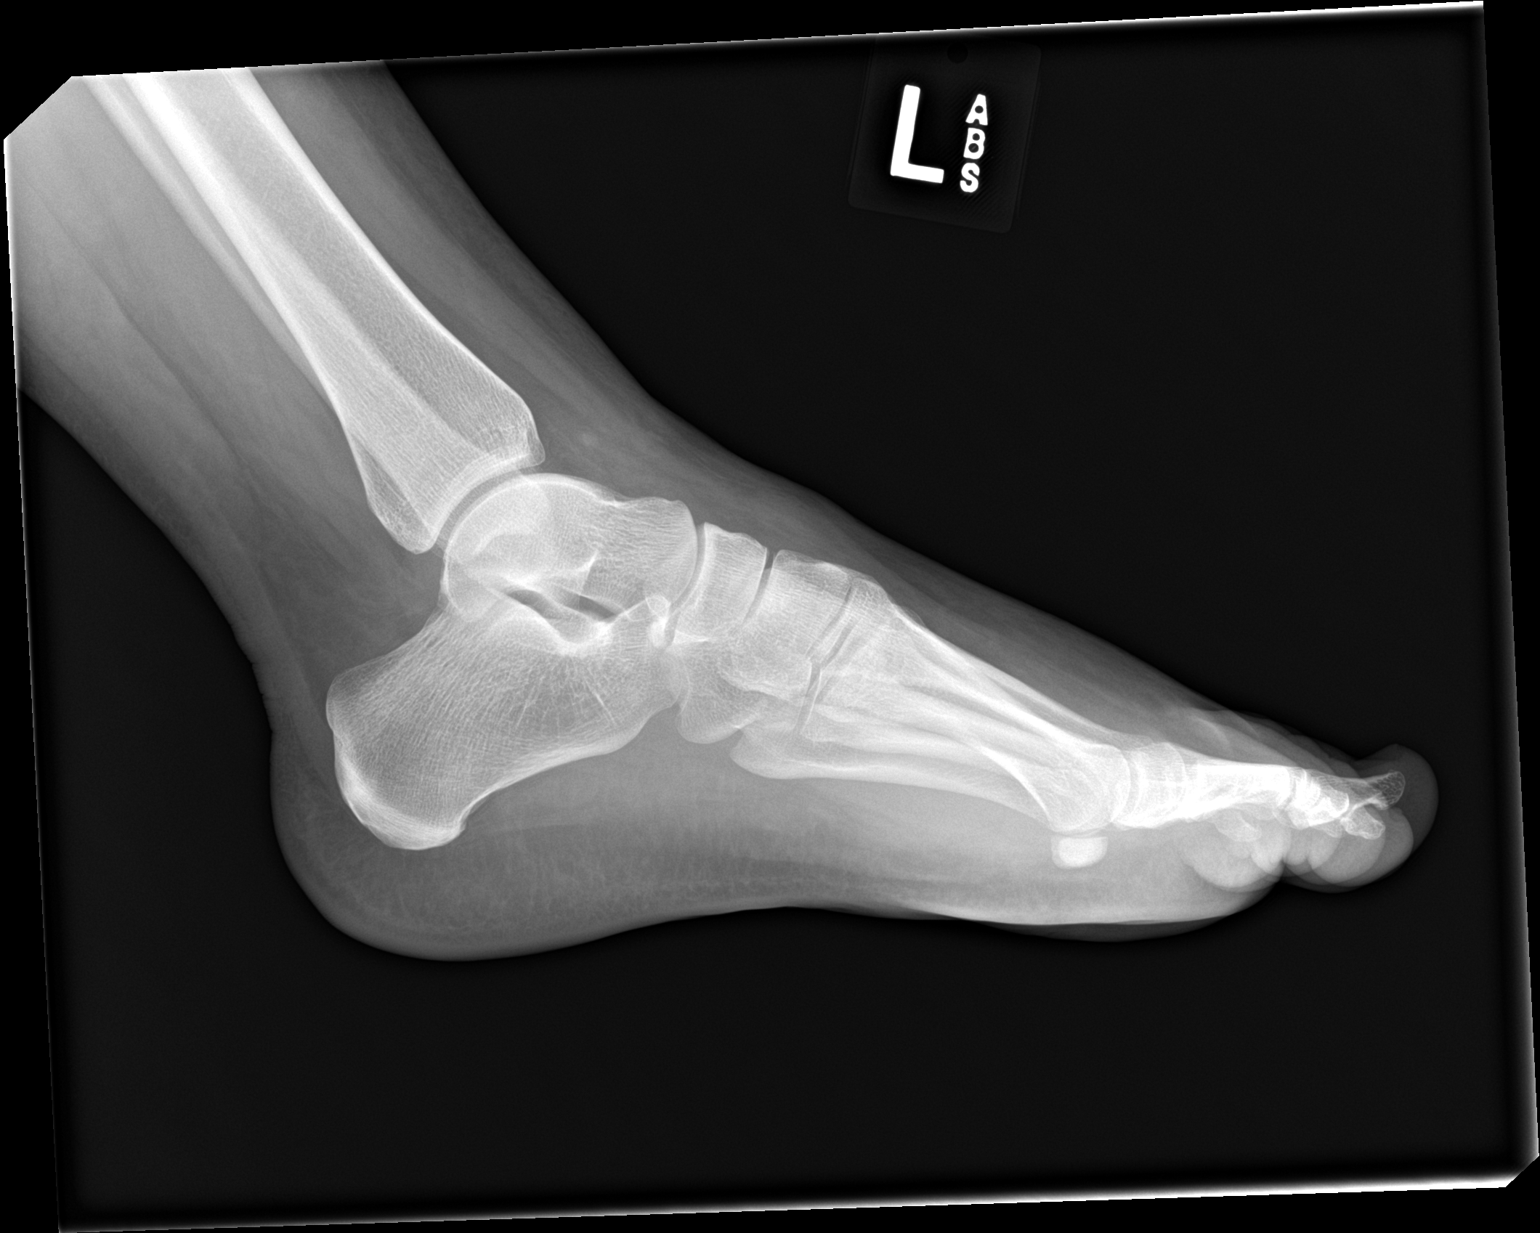

[3 of 3 positions shown; findings below may reference images not displayed]

FINDINGS: Frontal, oblique, and lateral views obtained. There is mild soft
tissue swelling. No fracture or dislocation. Joint spaces appear
normal. No erosive change.
IMPRESSION: Mild soft tissue swelling. No fracture or dislocation. No evident
arthropathy.

## 2020-06-25 ENCOUNTER — Other Ambulatory Visit: Payer: Self-pay | Admitting: Gerontology

## 2020-06-29 ENCOUNTER — Other Ambulatory Visit: Payer: Self-pay | Admitting: Gerontology

## 2020-06-29 ENCOUNTER — Institutional Professional Consult (permissible substitution): Payer: Self-pay | Admitting: Licensed Clinical Social Worker

## 2020-06-29 DIAGNOSIS — M1A09X0 Idiopathic chronic gout, multiple sites, without tophus (tophi): Secondary | ICD-10-CM

## 2020-06-29 DIAGNOSIS — F331 Major depressive disorder, recurrent, moderate: Secondary | ICD-10-CM

## 2020-06-29 MED ORDER — CITALOPRAM HYDROBROMIDE 20 MG PO TABS: 20.0000 mg | ORAL_TABLET | Freq: Every day | ORAL | 0 refills | Status: DC

## 2020-06-29 MED ORDER — ALLOPURINOL 100 MG PO TABS: 100.0000 mg | ORAL_TABLET | Freq: Two times a day (BID) | ORAL | 3 refills | Status: DC

## 2020-06-30 ENCOUNTER — Institutional Professional Consult (permissible substitution): Payer: Self-pay | Admitting: Licensed Clinical Social Worker

## 2020-07-02 ENCOUNTER — Encounter: Payer: Self-pay | Admitting: Obstetrics and Gynecology

## 2020-07-07 ENCOUNTER — Encounter: Payer: Self-pay | Admitting: Obstetrics and Gynecology

## 2020-07-07 ENCOUNTER — Other Ambulatory Visit: Payer: Self-pay

## 2020-07-07 ENCOUNTER — Ambulatory Visit: Payer: Self-pay | Admitting: Gerontology

## 2020-07-07 DIAGNOSIS — R200 Anesthesia of skin: Secondary | ICD-10-CM | POA: Insufficient documentation

## 2020-07-07 DIAGNOSIS — G4700 Insomnia, unspecified: Secondary | ICD-10-CM

## 2020-07-07 DIAGNOSIS — Z8659 Personal history of other mental and behavioral disorders: Secondary | ICD-10-CM

## 2020-07-07 DIAGNOSIS — R7303 Prediabetes: Secondary | ICD-10-CM

## 2020-07-07 DIAGNOSIS — H5213 Myopia, bilateral: Secondary | ICD-10-CM

## 2020-07-07 DIAGNOSIS — R202 Paresthesia of skin: Secondary | ICD-10-CM | POA: Insufficient documentation

## 2020-07-07 MED ORDER — MELATONIN 1 MG PO CAPS: 1.0000 | ORAL_CAPSULE | Freq: Every day | ORAL | 0 refills | Status: DC

## 2020-07-07 NOTE — Progress Notes (Signed)
Established Patient Office Visit  Subjective:  Patient ID: Latoya Benson, female    DOB: 12/25/1995  Age: 25 y.o. MRN: [id number]  CC: No chief complaint on file. Patient consents to telephone visit and 2 patient identifiers was used to identify patient.  HPI Latoya Benson presents for follow up of gout, c/o insomnia, numbness and tingling. She states that her gout is under control with taking Allopurinol. She states that her mood is good, denies suicidal nor homicidal ideation, but she c/o insomnia that started 1 week ago. She denies any changes in her daily routine. She states that she was out of her Celexa 4 days last week and just resumed taking it. She also c/o intermittent numbness and tingling to bilateral hands, but mostly worse in her right hand, that started 2 weeks ago. She works in a Occupational psychologist. She states that numbness occurs 2-3 times daily, it lasts few minutes. She states that it resolves with shaking her hands. She denies any muscle or motor weakness. Overall, she states that she's doing well and offers no further complaint.  Past Medical History:  Diagnosis Date  . Medical history non-contributory   . PCOS (polycystic ovarian syndrome)   . PCOS (polycystic ovarian syndrome)   . PCOS (polycystic ovarian syndrome) 03/2018    Past Surgical History:  Procedure Laterality Date  . CHOLECYSTECTOMY  05/22/2013  . COLON SURGERY  day after birth  . intestinal surgery at birth      Family History  Problem Relation Age of Onset  . Diabetes Mother   . COPD Father   . Hypertension Father     Social History   Socioeconomic History  . Marital status: Married    Spouse name: Not on file  . Number of children: Not on file  . Years of education: Not on file  . Highest education level: Not on file  Occupational History  . Not on file  Tobacco Use  . Smoking status: Former Games developer  . Smokeless tobacco: Never Used  Substance and Sexual Activity  . Alcohol use:  Yes  . Drug use: No  . Sexual activity: Yes    Birth control/protection: Other-see comments    Comment: hx of one miscarriage, used sprintec but needs refill   Other Topics Concern  . Not on file  Social History Narrative  . Not on file   Social Determinants of Health   Financial Resource Strain: High Risk  . Difficulty of Paying Living Expenses: Very hard  Food Insecurity: No Food Insecurity  . Worried About Programme researcher, broadcasting/film/video in the Last Year: Never true  . Ran Out of Food in the Last Year: Never true  Transportation Needs: No Transportation Needs  . Lack of Transportation (Medical): No  . Lack of Transportation (Non-Medical): No  Physical Activity: Sufficiently Active  . Days of Exercise per Week: 7 days  . Minutes of Exercise per Session: 30 min  Stress: Stress Concern Present  . Feeling of Stress : Very much  Social Connections: Moderately Isolated  . Frequency of Communication with Friends and Family: More than three times a week  . Frequency of Social Gatherings with Friends and Family: Twice a week  . Attends Religious Services: Never  . Active Member of Clubs or Organizations: No  . Attends Banker Meetings: Never  . Marital Status: Married  Catering manager Violence: Not At Risk  . Fear of Current or Ex-Partner: No  . Emotionally Abused: No  .  Physically Abused: No  . Sexually Abused: No    Outpatient Medications Prior to Visit  Medication Sig Dispense Refill  . allopurinol (ZYLOPRIM) 100 MG tablet Take 1 tablet (100 mg total) by mouth 2 (two) times daily. 60 tablet 3  . citalopram (CELEXA) 20 MG tablet Take 1 tablet (20 mg total) by mouth daily. 30 tablet 0  . norgestimate-ethinyl estradiol (ORTHO-CYCLEN) 0.25-35 MG-MCG tablet Take 1 tablet by mouth daily for 28 days. 1 Package 12   No facility-administered medications prior to visit.    No Known Allergies  ROS Review of Systems  Constitutional: Negative.   HENT: Negative.   Respiratory:  Negative.   Cardiovascular: Negative.   Neurological: Positive for numbness (intermittent to bilateral hands).  Psychiatric/Behavioral: Positive for sleep disturbance.      Objective:    Physical Exam No physical exam was done There were no vitals taken for this visit. Wt Readings from Last 3 Encounters:  05/05/20 212 lb 14.4 oz (96.6 kg)  04/07/20 209 lb 1.6 oz (94.8 kg)  09/30/19 214 lb (97.1 kg)     Health Maintenance Due  Topic Date Due  . Hepatitis C Screening  Never done  . COVID-19 Vaccine (1) Never done  . TETANUS/TDAP  Never done  . PAP-Cervical Cytology Screening  Never done  . PAP SMEAR-Modifier  08/06/2020    There are no preventive care reminders to display for this patient.  Lab Results  Component Value Date   TSH 1.400 04/07/2020   Lab Results  Component Value Date   WBC 6.4 03/24/2020   HGB 11.7 03/24/2020   HCT 34.8 03/24/2020   MCV 91 03/24/2020   PLT 466 (H) 03/24/2020   Lab Results  Component Value Date   NA 141 03/24/2020   K 4.9 03/24/2020   CO2 15 (L) 03/24/2020   GLUCOSE 92 03/24/2020   BUN 10 03/24/2020   CREATININE 0.85 03/24/2020   BILITOT <0.2 03/24/2020   ALKPHOS 50 03/24/2020   AST 29 03/24/2020   ALT 24 03/24/2020   PROT 7.5 03/24/2020   ALBUMIN 4.5 03/24/2020   CALCIUM 9.8 03/24/2020   ANIONGAP 9 07/08/2019   Lab Results  Component Value Date   CHOL 186 03/24/2020   Lab Results  Component Value Date   HDL 42 03/24/2020   Lab Results  Component Value Date   LDLCALC 97 03/24/2020   Lab Results  Component Value Date   TRIG 279 (H) 03/24/2020   Lab Results  Component Value Date   CHOLHDL 4.4 03/24/2020   Lab Results  Component Value Date   HGBA1C 5.7 (H) 03/24/2020      Assessment & Plan:   1. History of depression - She will continue on Celexa and will follow up with [facility]. She was advised to call the Crisis Help line or go to the ED for worsening symptoms.  2. Numbness and tingling  in both hands - Will check Vitamin B12, she was advised to go to the ED for worsening symptoms. - Vitamin D (25 hydroxy); Future - B12 and Folate Panel; Future  3. Insomnia, unspecified type - She was advised on sleep hygiene, no television or using electronic gadgets while in bed. She was advised to take Melatonin, was educated on medication side effects. - Melatonin 1 MG CAPS; Take 1 capsule (1 mg total) by mouth at bedtime.  Dispense: 30 capsule; Refill: 0  4. Prediabetes - Labs will be rechecked. - HgB A1c; Future - Basic  Metabolic Panel (BMET); Future - CBC w/Diff; Future  5. Myopia of both eyes - She will follow up with - Ambulatory referral to Ophthalmology     Follow-up: Return in about 22 days (around 07/29/2020), or if symptoms worsen or fail to improve.    Jacquette Canales Trellis Paganini, NP

## 2020-07-13 ENCOUNTER — Encounter: Payer: Self-pay | Admitting: Obstetrics and Gynecology

## 2020-07-20 ENCOUNTER — Other Ambulatory Visit: Payer: Self-pay

## 2020-07-20 ENCOUNTER — Ambulatory Visit: Payer: Self-pay | Admitting: Licensed Clinical Social Worker

## 2020-07-20 ENCOUNTER — Institutional Professional Consult (permissible substitution): Payer: Self-pay | Admitting: Licensed Clinical Social Worker

## 2020-07-20 DIAGNOSIS — Z8659 Personal history of other mental and behavioral disorders: Secondary | ICD-10-CM
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
## 2020-07-21 ENCOUNTER — Telehealth: Payer: Self-pay

## 2020-07-21 ENCOUNTER — Ambulatory Visit: Payer: Self-pay | Admitting: Gerontology

## 2020-07-21 NOTE — Telephone Encounter (Signed)
Per Epic, patient has an appointment 07/22/2020 at 9:45 with Dr. Logan Bores at [facility].  Telephone call to [facility] to inquire about chance that Dr. Logan Bores could do repeat PAP at visit on 07/22/2020?  Left a message for the staff to return my call. Hart Carwin, RN

## 2020-07-21 NOTE — Telephone Encounter (Signed)
Spoke to patient today regarding the need for a repeat PAP and Physical due 07/2020.  Patient has a f/u with Dr. Logan Bores on 07/22/2020.  She would like for Dr. Logan Bores to do her PAP.  She will inquire for a PAP and Physical appointment at her visit on the 10th. Patient to have the office send information to [facility]. Hart Carwin, RN

## 2020-07-21 NOTE — Telephone Encounter (Signed)
[facility]y from [facility] called in. Kriste Basque stated that this pt had an abnormal  pap with the [facility] in 2021. Kriste Basque was calling in to make [place] aware that the patient needs to follow up regarding her abnormal pap. Kriste Basque wants to know will the patient be continuing her care with Dr. Logan Bores or will she be going back to the [facility]. Kriste Basque would like a call back at [phone]. Please advise

## 2020-07-22 ENCOUNTER — Other Ambulatory Visit: Payer: Self-pay

## 2020-07-22 ENCOUNTER — Encounter: Payer: Self-pay | Admitting: Obstetrics and Gynecology

## 2020-07-22 ENCOUNTER — Ambulatory Visit (INDEPENDENT_AMBULATORY_CARE_PROVIDER_SITE_OTHER): Payer: Self-pay | Admitting: Obstetrics and Gynecology

## 2020-07-22 VITALS — BP 126/92 | HR 78 | Resp 16 | Ht 60.0 in | Wt 220.8 lb

## 2020-07-22 DIAGNOSIS — E282 Polycystic ovarian syndrome: Secondary | ICD-10-CM

## 2020-07-22 DIAGNOSIS — R202 Paresthesia of skin: Secondary | ICD-10-CM

## 2020-07-22 DIAGNOSIS — Z0142 Encounter for cervical smear to confirm findings of recent normal smear following initial abnormal smear: Secondary | ICD-10-CM

## 2020-07-22 DIAGNOSIS — N912 Amenorrhea, unspecified: Secondary | ICD-10-CM

## 2020-07-22 DIAGNOSIS — R200 Anesthesia of skin: Secondary | ICD-10-CM

## 2020-07-22 DIAGNOSIS — Z6841 Body Mass Index (BMI) 40.0 and over, adult: Secondary | ICD-10-CM

## 2020-07-22 DIAGNOSIS — R7303 Prediabetes: Secondary | ICD-10-CM

## 2020-07-22 NOTE — Progress Notes (Signed)
HPI:      Ms. ONEDIA VARGUS is a 25 y.o. No obstetric history on file. who LMP was No LMP recorded.  Subjective:   She presents today for distant follow-up of PCO.  I saw her 2 years ago and she was considering fertility at that time.  She started OCPs and had normal regular cycles while on OCPs.  She has not been using anything for her insulin resistance and in fact was found to have a slightly elevated A1c recently. She stopped her OCPs in October and states that she has not had a menstrual period since that time. She desires immediate fertility. Her partner has fathered 2 children but has not had a semen analysis.    Hx: The following portions of the patient's history were reviewed and updated as appropriate:             She  has a past medical history of Medical history non-contributory, PCOS (polycystic ovarian syndrome), PCOS (polycystic ovarian syndrome), and PCOS (polycystic ovarian syndrome) (03/2018). She does not have any pertinent problems on file. She  has a past surgical history that includes intestinal surgery at birth; Cholecystectomy (05/22/2013); and Colon surgery (day after birth). Her family history includes COPD in her father; Diabetes in her mother; Hypertension in her father. She  reports that she has quit smoking. She has never used smokeless tobacco. She reports current alcohol use. She reports that she does not use drugs. She has a current medication list which includes the following prescription(s): allopurinol, citalopram, melatonin, and norgestimate-ethinyl estradiol. She has No Known Allergies.       Review of Systems:  Review of Systems  Constitutional: Denied constitutional symptoms, night sweats, recent illness, fatigue, fever, insomnia and weight loss.  Eyes: Denied eye symptoms, eye pain, photophobia, vision change and visual disturbance.  Ears/Nose/Throat/Neck: Denied ear, nose, throat or neck symptoms, hearing loss, nasal discharge, sinus congestion  and sore throat.  Cardiovascular: Denied cardiovascular symptoms, arrhythmia, chest pain/pressure, edema, exercise intolerance, orthopnea and palpitations.  Respiratory: Denied pulmonary symptoms, asthma, pleuritic pain, productive sputum, cough, dyspnea and wheezing.  Gastrointestinal: Denied, gastro-esophageal reflux, melena, nausea and vomiting.  Genitourinary: Denied genitourinary symptoms including symptomatic vaginal discharge, pelvic relaxation issues, and urinary complaints.  Musculoskeletal: Denied musculoskeletal symptoms, stiffness, swelling, muscle weakness and myalgia.  Dermatologic: Denied dermatology symptoms, rash and scar.  Neurologic: Denied neurology symptoms, dizziness, headache, neck pain and syncope.  Psychiatric: Denied psychiatric symptoms, anxiety and depression.  Endocrine: Denied endocrine symptoms including hot flashes and night sweats.   Meds:   Current Outpatient Medications on File Prior to Visit  Medication Sig Dispense Refill  . allopurinol (ZYLOPRIM) 100 MG tablet Take 1 tablet (100 mg total) by mouth 2 (two) times daily. 60 tablet 3  . citalopram (CELEXA) 20 MG tablet Take 1 tablet (20 mg total) by mouth daily. 30 tablet 0  . Melatonin 1 MG CAPS Take 1 capsule (1 mg total) by mouth at bedtime. 30 capsule 0  . norgestimate-ethinyl estradiol (ORTHO-CYCLEN) 0.25-35 MG-MCG tablet Take 1 tablet by mouth daily for 28 days. 1 Package 12   No current facility-administered medications on file prior to visit.       The pregnancy intention screening data noted above was reviewed. Potential methods of contraception were discussed. The patient elected to proceed with Pregnant/Seeking Pregnancy.     Objective:     Vitals:   07/22/20 0943  BP: (!) 126/92  Pulse: 78  Resp: 16   Filed Weights  07/22/20 0943  Weight: 220 lb 12.8 oz (100.2 kg)                Assessment:    No obstetric history on file. Patient Active Problem List   Diagnosis Date Noted   . Numbness and tingling in both hands 07/07/2020  . Insomnia 07/07/2020  . History of PCOS 05/05/2020  . Prediabetes 05/05/2020  . Myopia of both eyes 05/05/2020  . History of depression 05/05/2020  . ASCUS of cervix with negative high risk HPV 08/15/2019  . Class 2 obesity with body mass index (BMI) of 38.0 to 38.9 in adult 08/07/2019  . Pyelonephritis 07/31/2017  . Sepsis (HCC) 04/02/2016     1. PCO (polycystic ovaries)   2. Amenorrhea   3. Morbid obesity with BMI of 40.0-44.9, adult [facility])     Patient has a known history of PCO.  Likely anovulatory.  Also has known history of insulin resistance.   Plan:            1.  Have discussed PCO in detail.  Will redraw PCO lab work to determine appropriate treatment.  2.  Semen analysis equipment and literature given for partner.  3.  Once PCR results return will likely begin OCPs for 3 months.  4.  Metformin-as needed  5.  Patient states that she has a remote history of abnormal Pap smear without follow-up.  Recommend annual exam and Pap at the patient's convenience.  Orders No orders of the defined types were placed in this encounter.   No orders of the defined types were placed in this encounter.     F/U  No follow-ups on file. I spent 33 minutes involved in the care of this patient preparing to see the patient by obtaining and reviewing her medical history (including labs, imaging tests and prior procedures), documenting clinical information in the electronic health record (EHR), counseling and coordinating care plans, writing and sending prescriptions, ordering tests or procedures and directly communicating with the patient by discussing pertinent items from her history and physical exam as well as detailing my assessment and plan as noted above so that she has an informed understanding.  All of her questions were answered.  Elonda Husky, M.D. 07/22/2020 10:16 AM

## 2020-07-23 LAB — BASIC METABOLIC PANEL
BUN/Creatinine Ratio: 16 (ref 9–23)
BUN: 14 mg/dL (ref 6–20)
CO2: 23 mmol/L (ref 20–29)
Calcium: 10.2 mg/dL (ref 8.7–10.2)
Chloride: 102 mmol/L (ref 96–106)
Creatinine, Ser: 0.9 mg/dL (ref 0.57–1.00)
GFR calc Af Amer: 102 mL/min/{1.73_m2} (ref >59)
GFR calc non Af Amer: 89 mL/min/{1.73_m2} (ref >59)
Glucose: 77 mg/dL (ref 65–99)
Potassium: 4.3 mmol/L (ref 3.5–5.2)
Sodium: 141 mmol/L (ref 134–144)

## 2020-07-23 LAB — HEMOGLOBIN A1C
Est. average glucose Bld gHb Est-mCnc: 120 mg/dL
Hgb A1c MFr Bld: 5.8 % — ABNORMAL HIGH (ref 4.8–5.6)

## 2020-07-23 LAB — CBC WITH DIFFERENTIAL/PLATELET
Basophils Absolute: 0.1 10*3/uL (ref 0.0–0.2)
Basos: 1 %
EOS (ABSOLUTE): 0.2 10*3/uL (ref 0.0–0.4)
Eos: 3 %
Hematocrit: 36.3 % (ref 34.0–46.6)
Hemoglobin: 12.3 g/dL (ref 11.1–15.9)
Immature Grans (Abs): 0.1 10*3/uL (ref 0.0–0.1)
Immature Granulocytes: 1 %
Lymphocytes Absolute: 2.6 10*3/uL (ref 0.7–3.1)
Lymphs: 29 %
MCH: 30.4 pg (ref 26.6–33.0)
MCHC: 33.9 g/dL (ref 31.5–35.7)
MCV: 90 fL (ref 79–97)
Monocytes Absolute: 0.4 10*3/uL (ref 0.1–0.9)
Monocytes: 5 %
Neutrophils Absolute: 5.7 10*3/uL (ref 1.4–7.0)
Neutrophils: 61 %
Platelets: 524 10*3/uL — ABNORMAL HIGH (ref 150–450)
RBC: 4.05 x10E6/uL (ref 3.77–5.28)
RDW: 14 % (ref 11.7–15.4)
WBC: 9.1 10*3/uL (ref 3.4–10.8)

## 2020-07-23 LAB — DHEA-SULFATE: DHEA-SO4: 263 ug/dL (ref 84.8–378.0)

## 2020-07-23 LAB — VITAMIN D 25 HYDROXY (VIT D DEFICIENCY, FRACTURES): Vit D, 25-Hydroxy: 14.1 ng/mL — ABNORMAL LOW (ref 30.0–100.0)

## 2020-07-23 LAB — TSH: TSH: 1.87 u[IU]/mL (ref 0.450–4.500)

## 2020-07-23 LAB — B12 AND FOLATE PANEL
Folate: 10.5 ng/mL (ref >3.0)
Vitamin B-12: 349 pg/mL (ref 232–1245)

## 2020-07-23 LAB — TESTOSTERONE, FREE, TOTAL, SHBG
Sex Hormone Binding: 29.2 nmol/L (ref 24.6–122.0)
Testosterone, Free: 5.4 pg/mL — ABNORMAL HIGH (ref 0.0–4.2)
Testosterone: 77 ng/dL — ABNORMAL HIGH (ref 13–71)

## 2020-07-23 LAB — PROLACTIN: Prolactin: 14.6 ng/mL (ref 4.8–23.3)

## 2020-07-23 LAB — FSH/LH
FSH: 5.4 m[IU]/mL
LH: 15.3 m[IU]/mL

## 2020-07-23 LAB — GLUCOSE, FASTING: Glucose, Plasma: 82 mg/dL (ref 65–99)

## 2020-07-23 LAB — INSULIN, RANDOM: INSULIN: 49.5 u[IU]/mL — ABNORMAL HIGH (ref 2.6–24.9)

## 2020-07-27 ENCOUNTER — Other Ambulatory Visit: Payer: Self-pay | Admitting: Gerontology

## 2020-07-27 DIAGNOSIS — F331 Major depressive disorder, recurrent, moderate: Secondary | ICD-10-CM

## 2020-07-28 ENCOUNTER — Other Ambulatory Visit: Payer: Self-pay

## 2020-07-28 ENCOUNTER — Ambulatory Visit: Payer: Self-pay | Admitting: Licensed Clinical Social Worker

## 2020-07-28 DIAGNOSIS — F331 Major depressive disorder, recurrent, moderate: Secondary | ICD-10-CM

## 2020-07-28 DIAGNOSIS — F411 Generalized anxiety disorder: Secondary | ICD-10-CM

## 2020-07-28 NOTE — BH Specialist Note (Signed)
Integrated Behavioral Health Follow Up In-Person Visit  MRN: [id number] Name: Latoya Benson  Total time: 30 minutes  Types of Service: General Behavioral Integrated Care (BHI)  Interpretor:No. Interpretor Name and Language:   Subjective: Latoya Benson is a 25 y.o. female accompanied by herself Patient was referred by Hurman Horn, NP for mental health Patient reports the following symptoms/concerns: The patient discussed work stress and how everything has been going in her life. She noted that at her place of employment they are in the process of transitions. She stated she is still having a very hard time falling asleep, and would like to try a medication to help her with that. She notes that she can take 2 Tylenol PM's and still be wide awake hours later. She notes that she has tried Melatonin gummies and they no longer work. She notes that her symptoms of anxiety and depression are about the same, and not much has changed since her last follow-up session. She stated that she is taking 20 MG of Citalopram daily.  The patient endorsed that she has suicidal thoughts on occasion but denies any plan or means to carry out a plan. The patient denied access to firearms.The patient stated that she is aware of [facility] crisis line and mobile crisis unit and understands she can seek help through 911 or the local emergency department should her suicidal thoughts worsen. The patient denied any homicidal thoughts.  Duration of problem: ; Severity of problem: moderate  Objective: Mood: Euthymic and Affect: Appropriate Risk of harm to self or others: No plan to harm self or others  Life Context: Family and Social: see above  School/Work: see above  Self-Care: see above Life Changes: see above  Patient and/or Family's Strengths/Protective Factors: Concrete supports in place (healthy food, safe environments, etc.)  Goals Addressed: Patient will: 1.  Reduce symptoms of: agitation, anxiety and  depression  2.  Increase knowledge and/or ability of: coping skills, healthy habits and self-management skills  3.  Demonstrate ability to: Increase healthy adjustment to current life circumstances  Progress towards Goals: Ongoing  Interventions: Interventions utilized:  Supportive Counseling was utilized by the clinician during today's follow up session. The clinician processed with the patient how they have been doing since the last follow-up session. The clinician provided a space for the patient to ventilate their frustrations regarding their current life circumstances. Clinician measured the patient's anxiety and depression on a numerical scale. Clinician offered to present the patient's case for  consultation regarding request for medication to assist with sleep. Clinician provided the patient with resources and contact information for patient to utilize should she experience worsening thoughts of suicide.  Standardized Assessments completed: GAD-7 and PHQ 9 GAD-7    18 PHQ-9     16 Assessment: Patient currently experiencing see above   Patient may benefit from see above  Plan: 1. Follow up with behavioral health clinician on : 08/17/2020  2. Behavioral recommendations:  3. Referral(s): Integrated [employer] (In Clinic) 4. "From scale of 1-10, how likely are you to follow plan?":   Judith Part, Student-Social Work

## 2020-07-29 ENCOUNTER — Other Ambulatory Visit: Payer: Self-pay

## 2020-07-29 ENCOUNTER — Ambulatory Visit: Payer: Self-pay | Admitting: Gerontology

## 2020-07-29 ENCOUNTER — Encounter: Payer: Self-pay | Admitting: Gerontology

## 2020-07-29 ENCOUNTER — Other Ambulatory Visit: Payer: Self-pay | Admitting: Gerontology

## 2020-07-29 VITALS — BP 119/82 | HR 87 | Temp 97.9°F | Ht 61.5 in | Wt 219.0 lb

## 2020-07-29 DIAGNOSIS — E559 Vitamin D deficiency, unspecified: Secondary | ICD-10-CM | POA: Insufficient documentation

## 2020-07-29 DIAGNOSIS — R200 Anesthesia of skin: Secondary | ICD-10-CM

## 2020-07-29 DIAGNOSIS — E538 Deficiency of other specified B group vitamins: Secondary | ICD-10-CM

## 2020-07-29 DIAGNOSIS — Z8659 Personal history of other mental and behavioral disorders: Secondary | ICD-10-CM

## 2020-07-29 DIAGNOSIS — F331 Major depressive disorder, recurrent, moderate: Secondary | ICD-10-CM

## 2020-07-29 DIAGNOSIS — D75839 Thrombocytosis, unspecified: Secondary | ICD-10-CM | POA: Insufficient documentation

## 2020-07-29 DIAGNOSIS — R202 Paresthesia of skin: Secondary | ICD-10-CM

## 2020-07-29 DIAGNOSIS — G4700 Insomnia, unspecified: Secondary | ICD-10-CM

## 2020-07-29 DIAGNOSIS — R7303 Prediabetes: Secondary | ICD-10-CM

## 2020-07-29 MED ORDER — CITALOPRAM HYDROBROMIDE 20 MG PO TABS: 20.0000 mg | ORAL_TABLET | Freq: Every day | ORAL | 1 refills | Status: DC

## 2020-07-29 MED ORDER — VITAMIN B-12 100 MCG PO TABS
100.0000 ug | ORAL_TABLET | Freq: Every day | ORAL | 2 refills | Status: DC
Start: 2020-07-29 — End: 2020-10-27

## 2020-07-29 MED ORDER — VITAMIN D (ERGOCALCIFEROL) 1.25 MG (50000 UNIT) PO CAPS: 50000.0000 [IU] | ORAL_CAPSULE | ORAL | 0 refills | Status: DC

## 2020-07-29 NOTE — Patient Instructions (Signed)
Insomnia Insomnia is a sleep disorder that makes it difficult to fall asleep or stay asleep. Insomnia can cause fatigue, low energy, difficulty concentrating, mood swings, and poor performance at work or school. There are three different ways to classify insomnia:  Difficulty falling asleep.  Difficulty staying asleep.  Waking up too early in the morning. Any type of insomnia can be long-term (chronic) or short-term (acute). Both are common. Short-term insomnia usually lasts for three months or less. Chronic insomnia occurs at least three times a week for longer than three months. What are the causes? Insomnia may be caused by another condition, situation, or substance, such as:  Anxiety.  Certain medicines.  Gastroesophageal reflux disease (GERD) or other gastrointestinal conditions.  Asthma or other breathing conditions.  Restless legs syndrome, sleep apnea, or other sleep disorders.  Chronic pain.  Menopause.  Stroke.  Abuse of alcohol, tobacco, or illegal drugs.  Mental health conditions, such as depression.  Caffeine.  Neurological disorders, such as Alzheimer's disease.  An overactive thyroid (hyperthyroidism). Sometimes, the cause of insomnia may not be known. What increases the risk? Risk factors for insomnia include:  Gender. Women are affected more often than men.  Age. Insomnia is more common as you get older.  Stress.  Lack of exercise.  Irregular work schedule or working night shifts.  Traveling between different time zones.  Certain medical and mental health conditions. What are the signs or symptoms? If you have insomnia, the main symptom is having trouble falling asleep or having trouble staying asleep. This may lead to other symptoms, such as:  Feeling fatigued or having low energy.  Feeling nervous about going to sleep.  Not feeling rested in the morning.  Having trouble concentrating.  Feeling irritable, anxious, or depressed. How  is this diagnosed? This condition may be diagnosed based on:  Your symptoms and medical history. Your health care provider may ask about: ? Your sleep habits. ? Any medical conditions you have. ? Your mental health.  A physical exam. How is this treated? Treatment for insomnia depends on the cause. Treatment may focus on treating an underlying condition that is causing insomnia. Treatment may also include:  Medicines to help you sleep.  Counseling or therapy.  Lifestyle adjustments to help you sleep better. Follow these instructions at home: Eating and drinking  Limit or avoid alcohol, caffeinated beverages, and cigarettes, especially close to bedtime. These can disrupt your sleep.  Do not eat a large meal or eat spicy foods right before bedtime. This can lead to digestive discomfort that can make it hard for you to sleep.   Sleep habits  Keep a sleep diary to help you and your health care provider figure out what could be causing your insomnia. Write down: ? When you sleep. ? When you wake up during the night. ? How well you sleep. ? How rested you feel the next day. ? Any side effects of medicines you are taking. ? What you eat and drink.  Make your bedroom a dark, comfortable place where it is easy to fall asleep. ? Put up shades or blackout curtains to block light from outside. ? Use a white noise machine to block noise. ? Keep the temperature cool.  Limit screen use before bedtime. This includes: ? Watching TV. ? Using your smartphone, tablet, or computer.  Stick to a routine that includes going to bed and waking up at the same times every day and night. This can help you fall asleep faster. Consider   making a quiet activity, such as reading, part of your nighttime routine.  Try to avoid taking naps during the day so that you sleep better at night.  Get out of bed if you are still awake after 15 minutes of trying to sleep. Keep the lights down, but try reading or  doing a quiet activity. When you feel sleepy, go back to bed.   General instructions  Take over-the-counter and prescription medicines only as told by your health care provider.  Exercise regularly, as told by your health care provider. Avoid exercise starting several hours before bedtime.  Use relaxation techniques to manage stress. Ask your health care provider to suggest some techniques that may work well for you. These may include: ? Breathing exercises. ? Routines to release muscle tension. ? Visualizing peaceful scenes.  Make sure that you drive carefully. Avoid driving if you feel very sleepy.  Keep all follow-up visits as told by your health care provider. This is important. Contact a health care provider if:  You are tired throughout the day.  You have trouble in your daily routine due to sleepiness.  You continue to have sleep problems, or your sleep problems get worse. Get help right away if:  You have serious thoughts about hurting yourself or someone else. If you ever feel like you may hurt yourself or others, or have thoughts about taking your own life, get help right away. You can go to your nearest emergency department or call:  Your local emergency services (911 in the [place]  A suicide crisis helpline, such as the National Suicide Prevention Lifeline at 754-314-2721. This is open 24 hours a day. Summary  Insomnia is a sleep disorder that makes it difficult to fall asleep or stay asleep.  Insomnia can be long-term (chronic) or short-term (acute).  Treatment for insomnia depends on the cause. Treatment may focus on treating an underlying condition that is causing insomnia.  Keep a sleep diary to help you and your health care provider figure out what could be causing your insomnia. This information is not intended to replace advice given to you by your health care provider. Make sure you discuss any questions you have with your health care provider. Document  Revised: 04/08/2020 Document Reviewed: 04/08/2020 Elsevier Patient Education  2021 [employer] Vitamin D Deficiency Vitamin D deficiency is when your body does not have enough vitamin D. Vitamin D is important to your body because:  It helps your body use other minerals.  It helps to keep your bones strong and healthy.  It may help to prevent some diseases.  It helps your heart and other muscles work well. Not getting enough vitamin D can make your bones soft. It can also cause other health problems. What are the causes? This condition may be caused by:  Not eating enough foods that contain vitamin D.  Not getting enough sun.  Having diseases that make it hard for your body to absorb vitamin D.  Having a surgery in which a part of the stomach or a part of the small intestine is removed.  Having kidney disease or liver disease. What increases the risk? You are more likely to get this condition if:  You are older.  You do not spend much time outdoors.  You live in a nursing home.  You have had broken bones.  You have weak or thin bones (osteoporosis).  You have a disease or condition that changes how your body absorbs vitamin D.  You have  dark skin.  You take certain medicines.  You are overweight or obese. What are the signs or symptoms?  In mild cases, there may not be any symptoms. If the condition is very bad, symptoms may include: ? Bone pain. ? Muscle pain. ? Falling often. ? Broken bones caused by a minor injury. How is this treated? Treatment may include taking supplements as told by your doctor. Your doctor will tell you what dose is best for you. Supplements may include:  Vitamin D.  Calcium. Follow these instructions at home: Eating and drinking  Eat foods that contain vitamin D, such as: ? Dairy products, cereals, or juices with added vitamin D. Check the label. ? Fish, such as salmon or trout. ? Eggs. ? Oysters. ? Mushrooms. The items  listed above may not be a complete list of what you can eat and drink. Contact a dietitian for more options.   General instructions  Take medicines and supplements only as told by your doctor.  Get regular, safe exposure to natural sunlight.  Do not use a tanning bed.  Maintain a healthy weight. Lose weight if needed.  Keep all follow-up visits as told by your doctor. This is important. How is this prevented?  You can get vitamin D by: ? Eating foods that naturally contain vitamin D. ? Eating or drinking products that have vitamin D added to them, such as cereals, juices, and milk. ? Taking vitamin D or a multivitamin that contains vitamin D. ? Being in the sun. Your body makes vitamin D when your skin is exposed to sunlight. Your body changes the sunlight into a form of the vitamin that it can use. Contact a doctor if:  Your symptoms do not go away.  You feel sick to your stomach (nauseous).  You throw up (vomit).  You poop less often than normal, or you have trouble pooping (constipation). Summary  Vitamin D deficiency is when your body does not have enough vitamin D.  Vitamin D helps to keep your bones strong and healthy.  This condition is often treated by taking a supplement.  Your doctor will tell you what dose is best for you. This information is not intended to replace advice given to you by your health care provider. Make sure you discuss any questions you have with your health care provider. Document Revised: 02/04/2018 Document Reviewed: 02/04/2018 Elsevier Patient Education  2021 [employer].

## 2020-07-29 NOTE — Progress Notes (Signed)
Established Patient Office Visit  Subjective:  Patient ID: Latoya Benson, female    DOB: Oct 20, 1995  Age: 25 y.o. MRN: [id number]  CC:  Chief Complaint  Patient presents with  . Follow-up    Lab results    HPI Latoya Benson presents for follow up of intermittent numbness and tingling to bilateral hands and lab review. Currently, Latoya Benson states that her intermittent peripheral neuropathy and tingling has not worsened but Latoya Benson continues to experience 2-3 episodes during the day. Her Vitamin D level was 14.1 ng/ml, and her Vitamin B 12 level was 349 pg/ml. Her Platelets was 524. Her HgbA1c was 5.8% and will follow up with Dr Logan Bores for PCOS treatment. Latoya Benson denies any improvement in her difficulty falling and staying asleep, and will follow up with [facility]. Overall, Latoya Benson states that Latoya Benson's doing well and offers no further complaint.  Past Medical History:  Diagnosis Date  . Medical history non-contributory   . PCOS (polycystic ovarian syndrome)   . PCOS (polycystic ovarian syndrome)   . PCOS (polycystic ovarian syndrome) 03/2018    Past Surgical History:  Procedure Laterality Date  . CHOLECYSTECTOMY  05/22/2013  . COLON SURGERY  day after birth  . intestinal surgery at birth      Family History  Problem Relation Age of Onset  . Diabetes Mother   . COPD Father   . Hypertension Father     Social History   Socioeconomic History  . Marital status: Married    Spouse name: Not on file  . Number of children: Not on file  . Years of education: Not on file  . Highest education level: Not on file  Occupational History  . Not on file  Tobacco Use  . Smoking status: Former Games developer  . Smokeless tobacco: Never Used  Substance and Sexual Activity  . Alcohol use: Yes  . Drug use: No  . Sexual activity: Yes    Birth control/protection: Other-see comments    Comment: hx of one miscarriage, used sprintec but needs refill   Other Topics Concern  . Not on file  Social  History Narrative  . Not on file   Social Determinants of Health   Financial Resource Strain: High Risk  . Difficulty of Paying Living Expenses: Very hard  Food Insecurity: No Food Insecurity  . Worried About Programme researcher, broadcasting/film/video in the Last Year: Never true  . Ran Out of Food in the Last Year: Never true  Transportation Needs: No Transportation Needs  . Lack of Transportation (Medical): No  . Lack of Transportation (Non-Medical): No  Physical Activity: Sufficiently Active  . Days of Exercise per Week: 7 days  . Minutes of Exercise per Session: 30 min  Stress: Stress Concern Present  . Feeling of Stress : Very much  Social Connections: Moderately Isolated  . Frequency of Communication with Friends and Family: More than three times a week  . Frequency of Social Gatherings with Friends and Family: Twice a week  . Attends Religious Services: Never  . Active Member of Clubs or Organizations: No  . Attends Banker Meetings: Never  . Marital Status: Married  Catering manager Violence: Not At Risk  . Fear of Current or Ex-Partner: No  . Emotionally Abused: No  . Physically Abused: No  . Sexually Abused: No    Outpatient Medications Prior to Visit  Medication Sig Dispense Refill  . allopurinol (ZYLOPRIM) 100 MG tablet Take 1 tablet (100 mg total) by  mouth 2 (two) times daily. 60 tablet 3  . citalopram (CELEXA) 20 MG tablet Take 1 tablet (20 mg total) by mouth daily. 30 tablet 1  . Melatonin 1 MG CAPS Take 1 capsule (1 mg total) by mouth at bedtime. 30 capsule 0  . norgestimate-ethinyl estradiol (ORTHO-CYCLEN) 0.25-35 MG-MCG tablet Take 1 tablet by mouth daily for 28 days. 1 Package 12   No facility-administered medications prior to visit.    No Known Allergies  ROS Review of Systems  Constitutional: Negative.   Respiratory: Negative.   Cardiovascular: Negative.   Neurological: Positive for numbness.  Psychiatric/Behavioral: Positive for sleep disturbance.       Objective:    Physical Exam Constitutional:      Appearance: Normal appearance.  HENT:     Head: Normocephalic and atraumatic.  Cardiovascular:     Rate and Rhythm: Normal rate and regular rhythm.     Pulses: Normal pulses.     Heart sounds: Normal heart sounds.  Pulmonary:     Effort: Pulmonary effort is normal.     Breath sounds: Normal breath sounds.  Skin:    General: Skin is warm.  Neurological:     General: No focal deficit present.     Mental Status: Latoya Benson is alert and oriented to person, place, and time. Mental status is at baseline.  Psychiatric:        Mood and Affect: Mood normal.        Behavior: Behavior normal.        Thought Content: Thought content normal.        Judgment: Judgment normal.     BP 119/82 (BP Location: Left Arm, Patient Position: Sitting)   Pulse 87   Temp 97.9 F (36.6 C)   Ht 5' 1.5" (1.562 m)   Wt 219 lb (99.3 kg)   SpO2 98%   BMI 40.71 kg/m  Wt Readings from Last 3 Encounters:  07/29/20 219 lb (99.3 kg)  07/22/20 220 lb 12.8 oz (100.2 kg)  05/05/20 212 lb 14.4 oz (96.6 kg)   Encouraged weight loss  Health Maintenance Due  Topic Date Due  . Hepatitis C Screening  Never done  . COVID-19 Vaccine (1) Never done  . TETANUS/TDAP  Never done  . PAP-Cervical Cytology Screening  Never done  . PAP SMEAR-Modifier  08/06/2020    There are no preventive care reminders to display for this patient.  Lab Results  Component Value Date   TSH 1.870 07/22/2020   Lab Results  Component Value Date   WBC 9.1 07/22/2020   HGB 12.3 07/22/2020   HCT 36.3 07/22/2020   MCV 90 07/22/2020   PLT 524 (H) 07/22/2020   Lab Results  Component Value Date   NA 141 07/22/2020   K 4.3 07/22/2020   CO2 23 07/22/2020   GLUCOSE 77 07/22/2020   BUN 14 07/22/2020   CREATININE 0.90 07/22/2020   BILITOT <0.2 03/24/2020   ALKPHOS 50 03/24/2020   AST 29 03/24/2020   ALT 24 03/24/2020   PROT 7.5 03/24/2020   ALBUMIN 4.5 03/24/2020   CALCIUM 10.2  07/22/2020   ANIONGAP 9 07/08/2019   Lab Results  Component Value Date   CHOL 186 03/24/2020   Lab Results  Component Value Date   HDL 42 03/24/2020   Lab Results  Component Value Date   LDLCALC 97 03/24/2020   Lab Results  Component Value Date   TRIG 279 (H) 03/24/2020   Lab Results  Component Value Date  CHOLHDL 4.4 03/24/2020   Lab Results  Component Value Date   HGBA1C 5.8 (H) 07/22/2020      Assessment & Plan:     1. History of depression - Latoya Benson will continue with Celexa 20 mg daily and follow up with [facility]. Was advised to call Crisis help line or go to the ED for worsening symptoms.  2. Insomnia, unspecified type - Latoya Benson will follow up with [facility].  3. Numbness and tingling in both hands - Latoya Benson will continue with Vitamin B 12, was advised to notify clinic with worsening symptoms. - vitamin B-12 (CYANOCOBALAMIN) 100 MCG tablet; Take 1 tablet (100 mcg total) by mouth daily.  Dispense: 30 tablet; Refill: 2  4. Prediabetes - Her HgbA1c was 5.8%, Latoya Benson will follow up with Dr Logan Bores for PCOS treatment next week. Latoya Benson was advised to continue on low car/non concentrated sweet diet and exercise as tolerated.  5. Vitamin D deficiency - Her Vitamin D was 14.1ng/ml, Latoya Benson will continue on Ergocalciferol weekly, advised to get some sunlight. Will recheck in 3 months. - Vitamin D, Ergocalciferol, (DRISDOL) 1.25 MG (50000 UNIT) CAPS capsule; Take 1 capsule (50,000 Units total) by mouth every 7 (seven) days.  Dispense: 12 capsule; Refill: 0 - Vitamin D (25 hydroxy); Future  6. Low vitamin B12 level - Latoya Benson will continue on Vit B 12, will recheck labs - vitamin B-12 (CYANOCOBALAMIN) 100 MCG tablet; Take 1 tablet (100 mcg total) by mouth daily.  Dispense: 30 tablet; Refill: 2 - B12 and Folate Panel; Future  7. Thrombocytosis - Her platelet count was 524, Latoya Benson was educated on the possible risks of elevated platelets, and to notify clinic or go to the ED,  Latoya Benson verbalized understanding.   Follow-up: Return in about 3 months (around 10/27/2020), or if symptoms worsen or fail to improve.    Kimberlie Csaszar Trellis Paganini, NP

## 2020-08-04 ENCOUNTER — Telehealth: Payer: Self-pay | Admitting: Licensed Clinical Social Worker

## 2020-08-04 NOTE — Telephone Encounter (Signed)
Called the patient to let her know per the Case Consultation the recommendations were for her to take Trazodone 50 MG at bedtime and that her prescription would be called in to [facility] on [address]

## 2020-08-05 ENCOUNTER — Other Ambulatory Visit: Payer: Self-pay

## 2020-08-05 ENCOUNTER — Encounter: Payer: Self-pay | Admitting: Obstetrics and Gynecology

## 2020-08-05 ENCOUNTER — Ambulatory Visit (INDEPENDENT_AMBULATORY_CARE_PROVIDER_SITE_OTHER): Payer: Self-pay | Admitting: Obstetrics and Gynecology

## 2020-08-05 ENCOUNTER — Other Ambulatory Visit: Payer: Self-pay | Admitting: Gerontology

## 2020-08-05 ENCOUNTER — Telehealth: Payer: Self-pay

## 2020-08-05 VITALS — BP 137/89 | HR 84 | Ht 61.5 in | Wt 219.7 lb

## 2020-08-05 DIAGNOSIS — E8881 Metabolic syndrome: Secondary | ICD-10-CM

## 2020-08-05 DIAGNOSIS — Z6841 Body Mass Index (BMI) 40.0 and over, adult: Secondary | ICD-10-CM

## 2020-08-05 DIAGNOSIS — E282 Polycystic ovarian syndrome: Secondary | ICD-10-CM

## 2020-08-05 DIAGNOSIS — G4700 Insomnia, unspecified: Secondary | ICD-10-CM

## 2020-08-05 MED ORDER — TRAZODONE HCL 50 MG PO TABS: 50.0000 mg | ORAL_TABLET | Freq: Every day | ORAL | 0 refills | Status: DC

## 2020-08-05 MED ORDER — METFORMIN HCL 500 MG PO TABS
ORAL_TABLET | ORAL | 5 refills | Status: DC
Start: 2020-08-05 — End: 2020-08-05

## 2020-08-05 NOTE — Progress Notes (Signed)
Pt present for a follow up PCO. Pt stated that she was doing well.

## 2020-08-05 NOTE — Progress Notes (Signed)
HPI:      [name] is a 24 y.o. G1P0010 who LMP was Patient's last menstrual period was 03/12/2020 (within weeks).  Subjective:   She presents today for follow-up after PCO labs.  She reports that she has the OCPs but has not started them yet.  She also states that her husband plans to have a semen analysis but has not done this yet.  She reports that she remains amenorrheic since discontinuing OCPs late in 2021. She says she has seen her PCR results but "does not know what they mean". She desires immediate fertility.    Hx: The following portions of the patient's history were reviewed and updated as appropriate:             She  has a past medical history of Depression, Medical history non-contributory, PCOS (polycystic ovarian syndrome), PCOS (polycystic ovarian syndrome), and PCOS (polycystic ovarian syndrome) (03/2018). She does not have any pertinent problems on file. She  has a past surgical history that includes intestinal surgery at birth; Cholecystectomy (05/22/2013); and Colon surgery (day after birth). Her family history includes COPD in her father; Congestive Heart Failure in her father; Diabetes in her mother; Hypertension in her father; Irritable bowel syndrome in her mother. She  reports that she has quit smoking. She has never used smokeless tobacco. She reports previous alcohol use. She reports that she does not use drugs. She has a current medication list which includes the following prescription(s): allopurinol, citalopram, metformin, trazodone, vitamin b-12, vitamin d (ergocalciferol), and norgestimate-ethinyl estradiol. She has No Known Allergies.       Review of Systems:  Review of Systems  Constitutional: Denied constitutional symptoms, night sweats, recent illness, fatigue, fever, insomnia and weight loss.  Eyes: Denied eye symptoms, eye pain, photophobia, vision change and visual disturbance.  Ears/Nose/Throat/Neck: Denied ear, nose, throat or neck  symptoms, hearing loss, nasal discharge, sinus congestion and sore throat.  Cardiovascular: Denied cardiovascular symptoms, arrhythmia, chest pain/pressure, edema, exercise intolerance, orthopnea and palpitations.  Respiratory: Denied pulmonary symptoms, asthma, pleuritic pain, productive sputum, cough, dyspnea and wheezing.  Gastrointestinal: Denied, gastro-esophageal reflux, melena, nausea and vomiting.  Genitourinary: Denied genitourinary symptoms including symptomatic vaginal discharge, pelvic relaxation issues, and urinary complaints.  Musculoskeletal: Denied musculoskeletal symptoms, stiffness, swelling, muscle weakness and myalgia.  Dermatologic: Denied dermatology symptoms, rash and scar.  Neurologic: Denied neurology symptoms, dizziness, headache, neck pain and syncope.  Psychiatric: Denied psychiatric symptoms, anxiety and depression.  Endocrine: Denied endocrine symptoms including hot flashes and night sweats.   Meds:   Current Outpatient Medications on File Prior to Visit  Medication Sig Dispense Refill  . allopurinol (ZYLOPRIM) 100 MG tablet Take 1 tablet (100 mg total) by mouth 2 (two) times daily. 60 tablet 3  . citalopram (CELEXA) 20 MG tablet Take 1 tablet (20 mg total) by mouth daily. 30 tablet 1  . vitamin B-12 (CYANOCOBALAMIN) 100 MCG tablet Take 1 tablet (100 mcg total) by mouth daily. 30 tablet 2  . Vitamin D, Ergocalciferol, (DRISDOL) 1.25 MG (50000 UNIT) CAPS capsule Take 1 capsule (50,000 Units total) by mouth every 7 (seven) days. 12 capsule 0  . norgestimate-ethinyl estradiol (ORTHO-CYCLEN) 0.25-35 MG-MCG tablet Take 1 tablet by mouth daily for 28 days. 1 Package 12   No current facility-administered medications on file prior to visit.       The pregnancy intention screening data noted above was reviewed. Potential methods of contraception were discussed. The patient elected to proceed with Pregnant/Seeking Pregnancy.  Objective:     Vitals:   08/05/20  1040  BP: 137/89  Pulse: 84   Filed Weights   08/05/20 1040  Weight: 219 lb 11.2 oz (99.7 kg)              PCO lab results reviewed directly with the patient each 1 discussed independently.  Assessment:    G1P0010 Patient Active Problem List   Diagnosis Date Noted  . Vitamin D deficiency 07/29/2020  . Low vitamin B12 level 07/29/2020  . Thrombocytosis 07/29/2020  . Numbness and tingling in both hands 07/07/2020  . Insomnia 07/07/2020  . History of PCOS 05/05/2020  . Prediabetes 05/05/2020  . Myopia of both eyes 05/05/2020  . History of depression 05/05/2020  . ASCUS of cervix with negative high risk HPV 08/15/2019  . Class 2 obesity with body mass index (BMI) of 38.0 to 38.9 in adult 08/07/2019  . Pyelonephritis 07/31/2017  . Sepsis (HCC) 04/02/2016     1. PCO (polycystic ovaries)   2. Insulin resistance   3. Morbid obesity with BMI of 40.0-44.9, adult (HCC)     Elevated testosterone and insulin resistance.   Plan:            1.  Discussed PCO in detail.  Rationale for OCPs and Metformin discussed.  All questions answered.  2.  Metformin 500 twice daily prescribed.  3.  Patient to start OCPs  4.  Husband for semen analysis.  5.  Plan follow-up in 10 weeks for instructions regarding discontinuation of OCPs, use of ovulation predictor kits, appropriately timed intercourse, semen analysis results. Orders No orders of the defined types were placed in this encounter.    Meds ordered this encounter  Medications  . metFORMIN (GLUCOPHAGE) 500 MG tablet    Sig: Take one tablet by mouth daily for one week. Then increase to one tablet twice a day for one week.    Dispense:  60 tablet    Refill:  5      F/U  Return in about 10 weeks (around 10/14/2020). I spent 31 minutes involved in the care of this patient preparing to see the patient by obtaining and reviewing her medical history (including labs, imaging tests and prior procedures), documenting clinical information in  the electronic health record (EHR), counseling and coordinating care plans, writing and sending prescriptions, ordering tests or procedures and directly communicating with the patient by discussing pertinent items from her history and physical exam as well as detailing my assessment and plan as noted above so that she has an informed understanding.  All of her questions were answered.  Elonda Husky, M.D. 08/05/2020 11:19 AM

## 2020-08-05 NOTE — Telephone Encounter (Signed)
Pt was checking out and stated that she uses [facility] - [place], [place] - [address] [place] [address] as her pharmacy and to let Dr. Logan Bores know

## 2020-08-09 ENCOUNTER — Encounter: Payer: Self-pay | Admitting: Family Medicine

## 2020-08-09 ENCOUNTER — Other Ambulatory Visit: Payer: Self-pay

## 2020-08-09 ENCOUNTER — Ambulatory Visit (LOCAL_COMMUNITY_HEALTH_CENTER): Payer: Self-pay | Admitting: Family Medicine

## 2020-08-09 VITALS — BP 133/86 | Ht 61.0 in | Wt 218.6 lb

## 2020-08-09 DIAGNOSIS — R87610 Atypical squamous cells of undetermined significance on cytologic smear of cervix (ASC-US): Secondary | ICD-10-CM

## 2020-08-09 DIAGNOSIS — Z6838 Body mass index (BMI) 38.0-38.9, adult: Secondary | ICD-10-CM

## 2020-08-09 DIAGNOSIS — Z01419 Encounter for gynecological examination (general) (routine) without abnormal findings: Secondary | ICD-10-CM

## 2020-08-09 DIAGNOSIS — Z8742 Personal history of other diseases of the female genital tract: Secondary | ICD-10-CM

## 2020-08-09 DIAGNOSIS — Z1331 Encounter for screening for depression: Secondary | ICD-10-CM

## 2020-08-09 DIAGNOSIS — Z3009 Encounter for other general counseling and advice on contraception: Secondary | ICD-10-CM

## 2020-08-09 DIAGNOSIS — E669 Obesity, unspecified: Secondary | ICD-10-CM

## 2020-08-09 DIAGNOSIS — Z30011 Encounter for initial prescription of contraceptive pills: Secondary | ICD-10-CM

## 2020-08-09 LAB — PREGNANCY, URINE: Preg Test, Ur: NEGATIVE

## 2020-08-09 MED ORDER — NORGESTIMATE-ETH ESTRADIOL 0.25-35 MG-MCG PO TABS
1.0000 | ORAL_TABLET | Freq: Every day | ORAL | 13 refills | Status: DC
Start: 2020-08-09 — End: 2021-11-16

## 2020-08-09 NOTE — Progress Notes (Signed)
[facility] DEPARTMENT [facility] [address] Main Number: [phone]  Family Planning Visit- Initial Visit  Subjective:  Latoya Benson is a 25 y.o.  G1P0010   being seen today for an initial well woman visit and to discuss family planning options.  She is currently using None for pregnancy prevention. Patient reports she does want a pregnancy in the next year.  Patient has the following medical conditions has Sepsis (HCC); Pyelonephritis; Class 2 obesity with body mass index (BMI) of 38.0 to 38.9 in adult; ASCUS of cervix with negative high risk HPV; History of PCOS; Prediabetes; Myopia of both eyes; History of depression; Numbness and tingling in both hands; Insomnia; Vitamin D deficiency; Low vitamin B12 level; and Thrombocytosis on their problem list.  Chief Complaint  Patient presents with  . Annual Exam    Pap    Patient reports  Depression: better controlled currently, has suicide attempt 3 months ago. Reports starting clexa and trazodone. Working well. Has PCP that manages PHQ9 today is 21 Reports passive SI but no plan Protective factors- thinks about her step kids   Patient denies live disease, history of migraines   Body mass index is 41.3 kg/m. - Patient is eligible for diabetes screening based on BMI and age >63?  no HA1C ordered? not applicable  Patient reports 1  partner/s in last year. Desires STI screening?  Yes  Has patient been screened once for HCV in the past?  No  No results found for: HCVAB  Does the patient have current drug use (including MJ), have a partner with drug use, and/or has been incarcerated since last result? No  If yes-- Screen for HCV through [facility]   Does the patient meet criteria for HBV testing? No  Criteria:  -Household, sexual or needle sharing contact with HBV -History of drug use -HIV positive -Those with known Hep C   Health Maintenance Due  Topic Date Due  . Hepatitis C  Screening  Never done  . COVID-19 Vaccine (1) Never done  . HPV VACCINES (1 - 2-dose series) Never done  . TETANUS/TDAP  Never done  . PAP-Cervical Cytology Screening  Never done  . PAP SMEAR-Modifier  08/06/2020    Review of Systems  Constitutional: Negative for chills and fever.  Eyes: Negative for blurred vision and double vision.  Respiratory: Negative for cough and shortness of breath.   Cardiovascular: Negative for chest pain and orthopnea.  Gastrointestinal: Negative for nausea and vomiting.  Genitourinary: Negative for dysuria, flank pain and frequency.  Musculoskeletal: Negative for myalgias.  Skin: Negative for rash.  Neurological: Negative for dizziness, tingling, weakness and headaches.  Endo/Heme/Allergies: Does not bruise/bleed easily.  Psychiatric/Behavioral: Negative for depression and suicidal ideas. The patient is not nervous/anxious.     The following portions of the patient's history were reviewed and updated as appropriate: allergies, current medications, past family history, past medical history, past social history, past surgical history and problem list. Problem list updated.   See flowsheet for other program required questions.  Objective:   Vitals:   08/09/20 1022  BP: 133/86  Weight: 218 lb 9.6 oz (99.2 kg)  Height: 5\' 1"  (1.549 m)    Physical Exam Constitutional:      Appearance: Normal appearance.  HENT:     Head: Normocephalic and atraumatic.  Pulmonary:     Effort: Pulmonary effort is normal.  Abdominal:     Palpations: Abdomen is soft.  Genitourinary:  General: Normal vulva.     Exam position: Lithotomy position.     Tanner stage (genital): 5.     Vagina: Normal.     Cervix: Normal.     Uterus: Normal.      Adnexa: Right adnexa normal and left adnexa normal.  Musculoskeletal:        General: Normal range of motion.  Skin:    General: Skin is warm and dry.  Neurological:     General: No focal deficit present.     Mental  Status: She is alert.  Psychiatric:        Mood and Affect: Mood normal.        Behavior: Behavior normal.       Assessment and Plan:  Latoya Benson is a 25 y.o. female presenting to the [facility] Department for an initial well woman exam/family planning visit  Contraception counseling: Reviewed all forms of birth control options in the tiered based approach. available including abstinence; over the counter/barrier methods; hormonal contraceptive medication including pill, patch, ring, injection,contraceptive implant, ECP; hormonal and nonhormonal IUDs; permanent sterilization options including vasectomy and the various tubal sterilization modalities. Risks, benefits, and typical effectiveness rates were reviewed.  Questions were answered.  Written information was also given to the patient to review.  Patient desires OCP, this was prescribed for patient. She will follow up in 1 yr  for surveillance.  She was told to call with any further questions, or with any concerns about this method of contraception.  Emphasized use of condoms 100% of the time for STI prevention.   ECP not indicated  1. Class 2 obesity with body mass index (BMI) of 38.0 to 38.9 in adult, unspecified obesity type, unspecified whether serious comorbidity present Recommend 10% weight reduction to help with fertility and PCOS  2. ASCUS of cervix with negative high risk HPV Repeat pap today  3. History of PCOS Under care of Dr. Logan Bores at [facility] Does not have regular periods, none since October Discussed with OBGYN having cycle control and understands OCP will prevent desired pregnancy  4. Well woman exam with routine gynecological exam Pap today [facility] WNL - HIV/HCV [facility] - Syphilis Serology, [facility] - Chlamydia/Gonorrhea  Lab - IGP, rfx Aptima HPV ASCU - Pregnancy, urine - norgestimate-ethinyl estradiol (SPRINTEC 28) 0.25-35 MG-MCG tablet; Take 1 tablet by mouth daily.  Dispense:  28 tablet; Refill: 13  5. Encounter for initial prescription of contraceptive pills - norgestimate-ethinyl estradiol (SPRINTEC 28) 0.25-35 MG-MCG tablet; Take 1 tablet by mouth daily.  Dispense: 28 tablet; Refill: 13  6. Positive depression screening Score 21  Under the care of MD, on celexa and trazodone Has passive SI but takes care of step kids which bring joy and purpose She is hopeful for a biologic child.  She admits to suicide attempt in the past.  She has counseling scheduled. Declines referral to [facility] today   Return in about 1 year (around 08/09/2021) for Yearly wellness exam.  Future Appointments  Date Time Provider Department Center  08/17/2020  5:00 PM Judith Part, Student-Social Work [facility] None  10/14/2020  9:45 AM Logan Bores Ellsworth Lennox, MD [facility] None  10/20/2020  9:45 AM [facility] NURSE [facility] None  10/27/2020  9:00 AM Rolm Gala, NP [facility] None    Federico Flake, MD

## 2020-08-09 NOTE — Progress Notes (Signed)
PT negative. OC consent signed and patient given 10 of 13 prescribed packs and counseled to call for appointment when she opens 10th pack. Patient states understanding.Burt Knack, RN

## 2020-08-09 NOTE — Patient Instructions (Signed)
Diet for Polycystic Ovary Syndrome Polycystic ovary syndrome (PCOS) is a common hormonal disorder that affects a woman's reproductive system. It can cause problems with menstrual periods and make it hard to get and stay pregnant. Changing what you eat can help your hormones reach normal levels, improve your health, and help you better manage PCOS. Following a balanced diet can help you lose weight and improve the way that your body uses the hormone insulin to control blood sugar. This may include:  Eating low-fat (lean) proteins, complex carbohydrates, fresh fruits and vegetables, low-fat dairy products, healthy fats, and fiber.  Cutting down on calories.  Exercising regularly. What are tips for following this plan?  Follow a balanced diet for meals and snacks. Eat breakfast, lunch, dinner, and one or two snacks every day.  Include protein in each meal and snack.  Choose whole grains instead of products that are made with refined flour.  Eat a variety of foods.  Exercise regularly as told by your health care provider. Aim to do at least 30 minutes of exercise on most days of the week.  If you are overweight or obese: ? Pay attention to how many calories you eat. Cutting down on calories can help you lose weight. ? Work with your health care provider or a dietitian to figure out how many calories you need each day. What foods should I eat? Fruits Include a variety of colors and types. All fruits are helpful for PCOS. Vegetables Include a variety of colors and types. All vegetables are helpful for PCOS. Grains Whole grains, such as whole wheat. Whole-grain breads, crackers, cereals, and pasta. Unsweetened oatmeal. Bulgur, barley, quinoa, and brown rice. Tortillas made from corn or whole-wheat flour. Meats and other proteins Lean proteins, such as fish, chicken, beans, eggs, and tofu. Dairy Low-fat dairy products, such as skim milk, cheese sticks, and yogurt. Beverages Low-fat or  fat-free drinks, such as water, low-fat milk, sugar-free drinks, and small amounts of 100% fruit juice. Seasonings and condiments Ketchup. Mustard. Barbecue sauce. Relish. Low-fat or fat-free mayonnaise. Fats and oils Olive oil or canola oil. Walnuts and almonds. The items listed above may not be a complete list of recommended foods and beverages. Contact a dietitian for more options.   What foods should I avoid? Foods that are high in calories or fat, especially saturated or trans fats. Fried foods. Sweets. Products that are made from refined white flour, including white bread, pastries, white rice, and pasta. The items listed above may not be a complete list of foods and beverages to avoid. Contact a dietitian for more information. Summary  PCOS is a hormonal imbalance that affects a woman's reproductive system. It can cause problems with menstrual periods and make it hard to get and stay pregnant.  You can help to manage your PCOS by exercising regularly and eating a healthy, varied diet of vegetables, fruit, whole grains, lean protein, and low-fat dairy products.  Changing what you eat can improve the way that your body uses insulin, help your hormones reach normal levels, and help you lose weight. This information is not intended to replace advice given to you by your health care provider. Make sure you discuss any questions you have with your health care provider. Document Revised: 11/06/2019 Document Reviewed: 11/06/2019 Elsevier Patient Education  2021 Elsevier Inc.  

## 2020-08-09 NOTE — Progress Notes (Signed)
Patient here for PE and repeat Pap. Last Pap 08/07/2019, ASCUS and HPV negative. Being followed at [facility] for PCOS. Also PCP [facility].Marland KitchenBurt Knack, RN

## 2020-08-13 LAB — IGP, RFX APTIMA HPV ASCU: PAP Smear Comment: 0

## 2020-08-13 LAB — HM DIABETES EYE EXAM

## 2020-08-17 ENCOUNTER — Other Ambulatory Visit: Payer: Self-pay

## 2020-08-17 ENCOUNTER — Ambulatory Visit: Payer: Self-pay | Admitting: Licensed Clinical Social Worker

## 2020-08-17 DIAGNOSIS — F331 Major depressive disorder, recurrent, moderate: Secondary | ICD-10-CM

## 2020-08-17 NOTE — BH Specialist Note (Signed)
Integrated Behavioral Health Follow Up In-Person Visit  MRN: [id number] Name: Latoya Benson  Total time: 60 minutes  Types of Service: General Behavioral Integrated Care (BHI)  Interpretor:No. Interpretor Name and Language:    Subjective: Latoya Benson is a 25 y.o. female accompanied by herself  Patient was referred by Hurman Horn, NP  For Mental Health  Patient reports the following symptoms/concerns: The patient states that she has been doing better since her last follow-up session. She stated that she has not felt as groggy at work and has more energy. She notes that when she takes Trazodone 50 MG and falls asleep shortly afterward. The patient noted that she has been doing things that make her feel good about herself. She stated she went and got a pedicure and had her nails done last week. She stated that she did her hair and makeup over the weekend. The patient notes that she enjoys shopping and has been trying to do things to make herself feel good. She discussed frustration with not having refills on her medication. She stated that the Pharmacy has to call her doctor every month and it seems like a hassle to get her medication. The patient discussed that she feels like the medication has been working well for her and hopes it continues to do so. The patient denied any suicidal or homicidal thoughts.   Duration of problem: ; Severity of problem: moderate  Objective: Mood: Euthymic and Affect: Appropriate Risk of harm to self or others: No plan to harm self or others  Life Context: Family and Social: see above School/Work: see above Self-Care: see above  Life Changes: see above  Patient and/or Family's Strengths/Protective Factors: Concrete supports in place (healthy food, safe environments, etc.)  Goals Addressed: Patient will: 1.  Reduce symptoms of: agitation, anxiety, depression and stress  2.  Increase knowledge and/or ability of: coping skills, healthy habits,  self-management skills and stress reduction  3.  Demonstrate ability to: Increase healthy adjustment to current life circumstances  Progress towards Goals: Ongoing  Interventions: Interventions utilized:  CBT Cognitive Behavioral Therapy was utilized by the clinician during today's follow up session. The clinician processed with the patient how they have been doing since the last follow-up session. The clinician provided a space for the patient to ventilate their frustrations regarding their current life circumstances. Clinician measured the patient's anxiety and depression on a numerical scale. Clinician explained to the patient that with new prescriptions for psychotropic medications it is not advisable to start with refills because adjustments to the medication may be needed. Clinician offered to message her provider for her since she is due for a refill soon. 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 Clinician discussed with the patient that there will be limited availability during the transition period and over the next couple of sessions will be reviewing a crisis plan, a medication management plan, and discussing check-in telephone calls to support her during this time.   Standardized Assessments completed: GAD-7 and PHQ 9  PHQ-9    6 GAD-7    7  Assessment: Patient currently experiencing see above  Patient may benefit from see above  Plan: 1. Follow up with behavioral health clinician on : 09/07/2020 at 6:00 PM  2. Behavioral recommendations:  3. Referral(s): [facility] [employer] (In Clinic) 4. "From scale of 1-10, how likely are you to follow plan?":   Judith Part, Student-Social Work

## 2020-08-30 ENCOUNTER — Other Ambulatory Visit: Payer: Self-pay | Admitting: Gerontology

## 2020-08-30 DIAGNOSIS — G4700 Insomnia, unspecified: Secondary | ICD-10-CM

## 2020-09-01 ENCOUNTER — Other Ambulatory Visit: Payer: Self-pay

## 2020-09-02 ENCOUNTER — Other Ambulatory Visit: Payer: Self-pay | Admitting: Gerontology

## 2020-09-02 DIAGNOSIS — G4700 Insomnia, unspecified: Secondary | ICD-10-CM

## 2020-09-02 MED ORDER — TRAZODONE HCL 50 MG PO TABS: 50.0000 mg | ORAL_TABLET | Freq: Every day | ORAL | 0 refills | Status: DC

## 2020-09-07 ENCOUNTER — Ambulatory Visit: Payer: Self-pay | Admitting: Licensed Clinical Social Worker

## 2020-09-07 ENCOUNTER — Other Ambulatory Visit: Payer: Self-pay

## 2020-09-07 DIAGNOSIS — F331 Major depressive disorder, recurrent, moderate: Secondary | ICD-10-CM

## 2020-09-07 NOTE — BH Specialist Note (Signed)
Integrated Behavioral Health Follow Up In-Person Visit  MRN: [id number] Name: Latoya Benson   Total time: 60 minutes  Types of Service: General Behavioral Integrated Care (BHI)  Interpretor:No. Interpretor Name and Language:   Subjective: Latoya Benson is a 25 y.o. female accompanied by herself Patient was referred by Hurman Horn, NP for mental health Patient reports the following symptoms/concerns: The patient reports that she has been doing better since her last follow-up session. She notes that her husband works night shift and calls her around 9:00 PM on his break every night, but she is already to sleepy to talk much. She stated," as soon as we hang up I am out and do not wake up until the morning." The patient noted that others she works with have noticed an increase in her energy and overall rested appearance. She stated that she feels so much better since she began taking Trazodone before bed. The patient discussed stressors with her living situation and in her role as a step parent. The patient reported that her work title has recently changed and that was a little stressful as well. However, the patient reports that she is in better control of her anger and has experienced no outbursts since her last session. She stated, " I have decided that I am not going to reply to things or engage people on subjects that make me angry, and I have leaned that if it does not benefit me to engage then I am just going to ignore it and move on."  She gave examples of difficult situations at her work and how she utilized her coping skills to deal with those situations. She discussed the difficulty getting her medication refilled and asked if she could get refills added to her prescription. The patient denied any suicidal or homicidal thoughts.  Duration of problem: ; Severity of problem: moderate  Objective: Mood: Euthymic and Affect: Appropriate Risk of harm to self or others: No plan to  harm self or others  Life Context: Family and Social: see above  School/Work: see above  Self-Care: see above  Life Changes: see above  Patient and/or Family's Strengths/Protective Factors: Concrete supports in place (healthy food, safe environments, etc.)  Goals Addressed: Patient will: 1.  Reduce symptoms of: agitation, anxiety and depression  2.  Increase knowledge and/or ability of: coping skills, healthy habits and self-management skills  3.  Demonstrate ability to: Increase healthy adjustment to current life circumstances  Progress towards Goals: Ongoing  Interventions: Interventions utilized:  CBT Cognitive Behavioral Therapy was utilized by the clinician during today's follow up session. The clinician processed with the patient how they have been doing since the last follow-up session. The clinician provided a space for the patient to ventilate their frustrations regarding their current life circumstances. Clinician measured the patient's anxiety and depression on a numerical scale. Clinician congratulated the patient for utilizing her coping skills to deal with difficult situations in her life and encouraged her to keep doing so. 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 Clinician offered to send an e-mail to the patient with an attached [facility] flyer that includes the walk- in hours and the crisis line number. Clinician explained that should the patient experience a crisis to utilize [facility] or go to the closest emergency department for help. Clinician explained that refills are usually not provided until the provider was confident the patient was at the optimal dosage  for them and that no adjustments were likely for several months. Clinician reassured the patient that she would request the primary care provider review her request for refills to be added to her prescriptions and that clinician  would still be available to call and check in with the patient regularly during the transition period.  Standardized Assessments completed: GAD-7 and PHQ 9  GAD-7    5 PHQ-9    4   Assessment: Patient currently experiencing see above  Patient may benefit from see above  Plan: 1. Follow up with behavioral health clinician on : 09/22/2020 at 4:00 PM 2. Behavioral recommendations:  3. Referral(s): [facility] [employer] (In Clinic) 4. "From scale of 1-10, how likely are you to follow plan?":   Judith Part, Student-Social Work

## 2020-09-22 ENCOUNTER — Ambulatory Visit: Payer: Self-pay | Admitting: Licensed Clinical Social Worker

## 2020-09-22 ENCOUNTER — Other Ambulatory Visit: Payer: Self-pay

## 2020-09-22 DIAGNOSIS — F331 Major depressive disorder, recurrent, moderate: Secondary | ICD-10-CM

## 2020-09-22 NOTE — BH Specialist Note (Signed)
Integrated Behavioral Health Follow Up In-Person Visit  MRN: [id number] Name: Latoya Benson    Total time: 60 minutes  Types of Service: Telephone visit Patient consents to telephone visit and 2 patient identifiers were used to identify patient  Interpretor:No. Interpretor Name and Language:   Subjective: Latoya Benson is a 25 y.o. female accompanied by  herself Patient was referred by Hurman Horn, NP for mental Health. Patient reports the following symptoms/concerns: The patient reports that she is doing well. She notes that her job title changed at her place of employment and she is doing well in her new role. She stated that the change at work has heled her manage her anxiety better. She notes that she is not taking her medication at the exact same time every night because of family memebers work schedules. She notes that her sleep has greatly improved and she is sleeping 7 hours per night during the weekdays and 8-9 hourson the weekends. She states her energy levels during the day continue to imporve. She reports that she has not lost her temper or had an outburst recently. Patient denies any suicidal or homicidal thoughts.  Duration of problem: ; Severity of problem: moderate  Objective: Mood: Euthymic and Affect: Appropriate Risk of harm to self or others: No plan to harm self or others  Life Context: Family and Social: see above School/Work: see above Self-Care: see above Life Changes: see above  Patient and/or Family's Strengths/Protective Factors: Concrete supports in place (healthy food, safe environments, etc.) and Sense of purpose  Goals Addressed: Patient will:  Reduce symptoms of: anxiety, depression, insomnia and obsessions   Increase knowledge and/or ability of: coping skills, healthy habits, self-management skills and stress reduction   Demonstrate ability to: Increase healthy adjustment to current life circumstances  Progress towards  Goals: Ongoing  Interventions: Interventions utilized:  CBT Cognitive Behavioral Therapywas utilized by the clinician during today's follow up session. The clinician processed with the patient how they have been doing since the last follow-up session. The clinician provided a space for the patient to ventilate their frustrations regarding their current life circumstances. Clinician measured the patient's anxiety and depression on a numerical scale.  The clinician encouraged the patient to utilize their coping skills to deal with their current life circumstances.  Clinician encouraged the patient to take their mediation at the same time everyday exactly as prescribed for it to reach it's full intended effect.    Standardized Assessments completed: GAD-7 and PHQ 9  GAD-7    4 PHQ-9    6  Assessment: Patient currently experiencing see above   Patient may benefit from see above  Plan: Follow up with behavioral health clinician on : Will call in late May to schedule follow-up Behavioral recommendations:  Referral(s): [facility] [employer] (In Clinic) "From scale of 1-10, how likely are you to follow plan?":   Judith Part, Student-Social Work

## 2020-09-27 ENCOUNTER — Other Ambulatory Visit: Payer: Self-pay | Admitting: Gerontology

## 2020-09-27 ENCOUNTER — Other Ambulatory Visit: Payer: Self-pay

## 2020-09-27 DIAGNOSIS — G4700 Insomnia, unspecified: Secondary | ICD-10-CM

## 2020-09-27 MED ORDER — CITALOPRAM HYDROBROMIDE 40 MG PO TABS
ORAL_TABLET | Freq: Every day | ORAL | 1 refills | Status: DC
  Filled 2020-09-27: qty 15, 30d supply, fill #0
  Filled 2020-10-26: qty 15, 30d supply, fill #1

## 2020-09-27 MED ORDER — TRAZODONE HCL 50 MG PO TABS
ORAL_TABLET | Freq: Every day | ORAL | 0 refills | Status: DC
  Filled 2020-09-27: qty 30, 30d supply, fill #0

## 2020-09-28 ENCOUNTER — Other Ambulatory Visit: Payer: Self-pay

## 2020-10-02 MED FILL — Metformin HCl Tab 500 MG: ORAL | 30 days supply | Qty: 60 | Fill #0 | Status: AC

## 2020-10-02 MED FILL — Allopurinol Tab 100 MG: ORAL | 30 days supply | Qty: 60 | Fill #0 | Status: AC

## 2020-10-04 ENCOUNTER — Other Ambulatory Visit: Payer: Self-pay

## 2020-10-14 ENCOUNTER — Encounter: Payer: Self-pay | Admitting: Obstetrics and Gynecology

## 2020-10-20 ENCOUNTER — Other Ambulatory Visit: Payer: Self-pay

## 2020-10-20 DIAGNOSIS — E538 Deficiency of other specified B group vitamins: Secondary | ICD-10-CM

## 2020-10-20 DIAGNOSIS — E559 Vitamin D deficiency, unspecified: Secondary | ICD-10-CM

## 2020-10-20 DIAGNOSIS — D75839 Thrombocytosis, unspecified: Secondary | ICD-10-CM

## 2020-10-21 LAB — SPECIMEN STATUS REPORT

## 2020-10-25 LAB — CBC WITH DIFFERENTIAL/PLATELET
Basophils Absolute: 0.1 10*3/uL (ref 0.0–0.2)
Basos: 1 %
EOS (ABSOLUTE): 0.2 10*3/uL (ref 0.0–0.4)
Eos: 4 %
Hematocrit: 35.5 % (ref 34.0–46.6)
Hemoglobin: 12.3 g/dL (ref 11.1–15.9)
Immature Grans (Abs): 0 10*3/uL (ref 0.0–0.1)
Immature Granulocytes: 0 %
Lymphocytes Absolute: 1.8 10*3/uL (ref 0.7–3.1)
Lymphs: 35 %
MCH: 30.8 pg (ref 26.6–33.0)
MCHC: 34.6 g/dL (ref 31.5–35.7)
MCV: 89 fL (ref 79–97)
Monocytes Absolute: 0.3 10*3/uL (ref 0.1–0.9)
Monocytes: 6 %
Neutrophils Absolute: 2.8 10*3/uL (ref 1.4–7.0)
Neutrophils: 54 %
Platelets: 389 10*3/uL (ref 150–450)
RBC: 3.99 x10E6/uL (ref 3.77–5.28)
RDW: 12.6 % (ref 11.7–15.4)
WBC: 5.2 10*3/uL (ref 3.4–10.8)

## 2020-10-25 LAB — SPECIMEN STATUS REPORT

## 2020-10-26 ENCOUNTER — Other Ambulatory Visit: Payer: Self-pay

## 2020-10-26 ENCOUNTER — Other Ambulatory Visit: Payer: Self-pay | Admitting: Gerontology

## 2020-10-26 DIAGNOSIS — E559 Vitamin D deficiency, unspecified: Secondary | ICD-10-CM

## 2020-10-26 DIAGNOSIS — G4700 Insomnia, unspecified: Secondary | ICD-10-CM

## 2020-10-26 MED FILL — Metformin HCl Tab 500 MG: ORAL | 30 days supply | Qty: 60 | Fill #1 | Status: AC

## 2020-10-26 MED FILL — Allopurinol Tab 100 MG: ORAL | 30 days supply | Qty: 60 | Fill #1 | Status: CN

## 2020-10-27 ENCOUNTER — Other Ambulatory Visit: Payer: Self-pay

## 2020-10-27 ENCOUNTER — Encounter: Payer: Self-pay | Admitting: Gerontology

## 2020-10-27 ENCOUNTER — Ambulatory Visit: Payer: Self-pay | Admitting: Gerontology

## 2020-10-27 VITALS — BP 100/64 | HR 81 | Temp 97.8°F | Resp 16 | Ht 62.0 in | Wt 217.9 lb

## 2020-10-27 DIAGNOSIS — E538 Deficiency of other specified B group vitamins: Secondary | ICD-10-CM

## 2020-10-27 DIAGNOSIS — G4700 Insomnia, unspecified: Secondary | ICD-10-CM

## 2020-10-27 DIAGNOSIS — M1A09X0 Idiopathic chronic gout, multiple sites, without tophus (tophi): Secondary | ICD-10-CM

## 2020-10-27 DIAGNOSIS — R202 Paresthesia of skin: Secondary | ICD-10-CM

## 2020-10-27 DIAGNOSIS — R200 Anesthesia of skin: Secondary | ICD-10-CM

## 2020-10-27 MED ORDER — ALLOPURINOL 100 MG PO TABS
ORAL_TABLET | Freq: Two times a day (BID) | ORAL | 3 refills | Status: DC
  Filled 2020-10-27: qty 60, 30d supply, fill #0
  Filled 2020-10-27: qty 60, fill #0
  Filled 2020-11-24: qty 60, 30d supply, fill #1
  Filled 2020-12-30: qty 60, 30d supply, fill #2
  Filled 2021-02-02: qty 60, 30d supply, fill #3

## 2020-10-27 MED ORDER — CITALOPRAM HYDROBROMIDE 40 MG PO TABS
ORAL_TABLET | Freq: Every day | ORAL | 1 refills | Status: DC
  Filled 2020-10-27: qty 15, 30d supply, fill #0
  Filled 2020-11-24: qty 15, 30d supply, fill #1

## 2020-10-27 MED ORDER — TRAZODONE HCL 50 MG PO TABS
ORAL_TABLET | Freq: Every day | ORAL | 0 refills | Status: DC
  Filled 2020-10-27: qty 30, 30d supply, fill #0

## 2020-10-27 MED ORDER — VITAMIN B-12 100 MCG PO TABS
100.0000 ug | ORAL_TABLET | Freq: Every day | ORAL | 2 refills | Status: DC
Start: 2020-10-27 — End: 2021-02-23
  Filled 2020-10-27: qty 30, 30d supply, fill #0
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
## 2020-10-28 ENCOUNTER — Ambulatory Visit (INDEPENDENT_AMBULATORY_CARE_PROVIDER_SITE_OTHER): Payer: Self-pay | Admitting: Obstetrics and Gynecology

## 2020-10-28 ENCOUNTER — Encounter: Payer: Self-pay | Admitting: Obstetrics and Gynecology

## 2020-10-28 ENCOUNTER — Other Ambulatory Visit: Payer: Self-pay | Admitting: Gerontology

## 2020-10-28 VITALS — BP 118/82 | HR 82 | Ht 62.0 in | Wt 218.3 lb

## 2020-10-28 DIAGNOSIS — E282 Polycystic ovarian syndrome: Secondary | ICD-10-CM

## 2020-10-28 DIAGNOSIS — R102 Pelvic and perineal pain: Secondary | ICD-10-CM

## 2020-10-28 LAB — B12 AND FOLATE PANEL
Folate: 9.7 ng/mL (ref >3.0)
Vitamin B-12: 759 pg/mL (ref 232–1245)

## 2020-10-28 LAB — VITAMIN D 25 HYDROXY (VIT D DEFICIENCY, FRACTURES): Vit D, 25-Hydroxy: 26.8 ng/mL — ABNORMAL LOW (ref 30.0–100.0)
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
## 2020-11-01 ENCOUNTER — Other Ambulatory Visit: Payer: Self-pay

## 2020-11-19 ENCOUNTER — Encounter: Payer: Self-pay | Admitting: Emergency Medicine

## 2020-11-19 ENCOUNTER — Emergency Department
Admission: EM | Admit: 2020-11-19 | Discharge: 2020-11-19 | Disposition: A | Discharge status: Home / Self Care | Payer: Self-pay | Attending: Emergency Medicine | Admitting: Emergency Medicine

## 2020-11-19 ENCOUNTER — Other Ambulatory Visit: Payer: Self-pay

## 2020-11-19 ENCOUNTER — Emergency Department: Payer: Self-pay

## 2020-11-19 DIAGNOSIS — W010XXA Fall on same level from slipping, tripping and stumbling without subsequent striking against object, initial encounter: Secondary | ICD-10-CM | POA: Insufficient documentation

## 2020-11-19 DIAGNOSIS — Y9301 Activity, walking, marching and hiking: Secondary | ICD-10-CM | POA: Insufficient documentation

## 2020-11-19 DIAGNOSIS — R197 Diarrhea, unspecified: Secondary | ICD-10-CM | POA: Insufficient documentation

## 2020-11-19 DIAGNOSIS — S60211A Contusion of right wrist, initial encounter: Secondary | ICD-10-CM | POA: Insufficient documentation

## 2020-11-19 DIAGNOSIS — R112 Nausea with vomiting, unspecified: Secondary | ICD-10-CM | POA: Insufficient documentation

## 2020-11-19 DIAGNOSIS — S60221A Contusion of right hand, initial encounter: Secondary | ICD-10-CM

## 2020-11-19 DIAGNOSIS — Z87891 Personal history of nicotine dependence: Secondary | ICD-10-CM | POA: Insufficient documentation

## 2020-11-19 DIAGNOSIS — Z20822 Contact with and (suspected) exposure to covid-19: Secondary | ICD-10-CM | POA: Insufficient documentation

## 2020-11-19 DIAGNOSIS — Y92838 Other recreation area as the place of occurrence of the external cause: Secondary | ICD-10-CM | POA: Insufficient documentation

## 2020-11-19 LAB — COMPREHENSIVE METABOLIC PANEL
ALT: 50 U/L — ABNORMAL HIGH (ref 0–44)
AST: 38 U/L (ref 15–41)
Albumin: 4.1 g/dL (ref 3.5–5.0)
Alkaline Phosphatase: 38 U/L (ref 38–126)
Anion gap: 11 (ref 5–15)
BUN: 11 mg/dL (ref 6–20)
CO2: 22 mmol/L (ref 22–32)
Calcium: 9.4 mg/dL (ref 8.9–10.3)
Chloride: 106 mmol/L (ref 98–111)
Creatinine, Ser: 0.95 mg/dL (ref 0.44–1.00)
GFR, Estimated: >60 mL/min (ref >60)
Glucose, Bld: 92 mg/dL (ref 70–99)
Potassium: 3.8 mmol/L (ref 3.5–5.1)
Sodium: 139 mmol/L (ref 135–145)
Total Bilirubin: 0.7 mg/dL (ref 0.3–1.2)
Total Protein: 8 g/dL (ref 6.5–8.1)

## 2020-11-19 LAB — URINALYSIS, COMPLETE (UACMP) WITH MICROSCOPIC
Bacteria, UA: NONE SEEN
Bilirubin Urine: NEGATIVE
Glucose, UA: NEGATIVE mg/dL
Hgb urine dipstick: NEGATIVE
Ketones, ur: NEGATIVE mg/dL
Leukocytes,Ua: NEGATIVE
Nitrite: NEGATIVE
Protein, ur: 30 mg/dL — AB
Specific Gravity, Urine: 1.018 (ref 1.005–1.030)
pH: 5 (ref 5.0–8.0)

## 2020-11-19 LAB — RESP PANEL BY RT-PCR (FLU A&B, COVID) ARPGX2
Influenza A by PCR: NEGATIVE
Influenza B by PCR: NEGATIVE
SARS Coronavirus 2 by RT PCR: NEGATIVE

## 2020-11-19 LAB — CBC
HCT: 36.1 % (ref 36.0–46.0)
Hemoglobin: 11.8 g/dL — ABNORMAL LOW (ref 12.0–15.0)
MCH: 30.4 pg (ref 26.0–34.0)
MCHC: 32.7 g/dL (ref 30.0–36.0)
MCV: 93 fL (ref 80.0–100.0)
Platelets: 460 10*3/uL — ABNORMAL HIGH (ref 150–400)
RBC: 3.88 MIL/uL (ref 3.87–5.11)
RDW: 14.2 % (ref 11.5–15.5)
WBC: 7 10*3/uL (ref 4.0–10.5)
nRBC: 0 % (ref 0.0–0.2)

## 2020-11-19 LAB — POC URINE PREG, ED: Preg Test, Ur: NEGATIVE

## 2020-11-19 LAB — LIPASE, BLOOD: Lipase: 30 U/L (ref 11–51)

## 2020-11-19 MED ORDER — ONDANSETRON HCL 4 MG PO TABS: 4.0000 mg | ORAL_TABLET | Freq: Every day | ORAL | 0 refills | Status: DC | PRN

## 2020-11-19 MED ORDER — ONDANSETRON 4 MG PO TBDP
4.0000 mg | ORAL_TABLET | Freq: Once | ORAL | Status: AC
  Administered 2020-11-19: 4 mg via ORAL
  Filled 2020-11-19: qty 1

## 2020-11-19 NOTE — ED Triage Notes (Signed)
Pt comes into the ED via POV c/o vomiting, diarrhea, and then she also slipped and fell down her stairs this morning.  Pt ambulatory to triage at this time with even and unlabored respirations.  Pt denies any abdominal pain or being around anyone that has been sick.  Pt denies hitting her head or any LCO from falling this morning and states he only complaint from that is right arm soreness.  No deformity noted and patient able to move extremity.
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
## 2020-11-19 NOTE — ED Notes (Signed)
See triage note  Presents with n/v/d   States sx's started couple of days ago denies any fever or h/a  Afebrile on arrival   Also states she fell this am  Having pain to right arm  No deformity noted   Good pulses

## 2020-11-24 ENCOUNTER — Other Ambulatory Visit: Payer: Self-pay

## 2020-11-24 ENCOUNTER — Other Ambulatory Visit: Payer: Self-pay | Admitting: Gerontology

## 2020-11-24 ENCOUNTER — Encounter: Payer: Self-pay | Admitting: Gerontology

## 2020-11-24 ENCOUNTER — Ambulatory Visit: Payer: Self-pay | Admitting: Gerontology

## 2020-11-24 VITALS — BP 117/70 | HR 75 | Temp 97.4°F | Resp 16 | Ht 62.0 in | Wt 209.1 lb

## 2020-11-24 DIAGNOSIS — G4700 Insomnia, unspecified: Secondary | ICD-10-CM

## 2020-11-24 DIAGNOSIS — R809 Proteinuria, unspecified: Secondary | ICD-10-CM | POA: Insufficient documentation

## 2020-11-24 DIAGNOSIS — D75839 Thrombocytosis, unspecified: Secondary | ICD-10-CM

## 2020-11-24 DIAGNOSIS — R7401 Elevation of levels of liver transaminase levels: Secondary | ICD-10-CM | POA: Insufficient documentation

## 2020-11-24 DIAGNOSIS — R197 Diarrhea, unspecified: Secondary | ICD-10-CM | POA: Insufficient documentation

## 2020-11-24 MED ORDER — LOPERAMIDE HCL 2 MG PO TABS
2.0000 mg | ORAL_TABLET | ORAL | 0 refills | Status: DC | PRN
  Filled 2020-11-24: qty 14, fill #0

## 2020-11-24 MED ORDER — TRAZODONE HCL 50 MG PO TABS
ORAL_TABLET | Freq: Every day | ORAL | 0 refills | Status: DC
  Filled 2020-11-24: qty 30, 30d supply, fill #0

## 2020-11-24 MED FILL — Metformin HCl Tab 500 MG: ORAL | 30 days supply | Qty: 60 | Fill #2 | Status: AC
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
## 2020-11-25 ENCOUNTER — Other Ambulatory Visit: Payer: Self-pay

## 2020-11-30 ENCOUNTER — Ambulatory Visit: Payer: Self-pay | Admitting: Licensed Clinical Social Worker

## 2020-11-30 ENCOUNTER — Other Ambulatory Visit: Payer: Self-pay

## 2020-11-30 DIAGNOSIS — F331 Major depressive disorder, recurrent, moderate: Secondary | ICD-10-CM

## 2020-11-30 DIAGNOSIS — F411 Generalized anxiety disorder: Secondary | ICD-10-CM
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
## 2020-12-08 ENCOUNTER — Other Ambulatory Visit: Payer: Self-pay

## 2020-12-16 ENCOUNTER — Other Ambulatory Visit: Payer: Self-pay

## 2020-12-16 ENCOUNTER — Ambulatory Visit: Payer: Self-pay | Admitting: Licensed Clinical Social Worker

## 2020-12-16 DIAGNOSIS — F330 Major depressive disorder, recurrent, mild: Secondary | ICD-10-CM

## 2020-12-16 DIAGNOSIS — F411 Generalized anxiety disorder: Secondary | ICD-10-CM

## 2020-12-16 NOTE — BH Specialist Note (Signed)
Integrated Behavioral Health Follow Up In-Person Visit  MRN: [id number] Name: Latoya Benson   Total time: 60 minutes   Types of Service: Telephone visit Patient consents to telephone visit and 2 patient identifiers were used to identify patient   Interpretor:No. Interpretor Name and Language: N/A  Subjective: Latoya Benson is a 25 y.o. female accompanied by  herself Patient was referred by Latoya Horn, NP  for mental health. Patient reports the following symptoms/concerns: The patient reports that everything in her life is the same since her last follow-up session. She reports that she is taking her medication as prescribed and sleeping very well. She stated she did forget to take her medication for a couple of nights and that was the only time she could not fall asleep. The patient stated, " I am pretty good and feel normal again." She notes she is settled into her new apartment which has a lot more room and she has finished unpacking. She discussed that she had close friends and family come over this past weekend to celebrate her four year old step son's birthday. She discussed the details of the pool party and how much she enjoyed the event. 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 The patient noted that she feels she is doing well overall and reported feeling less agitated. She stated she feels less depressed and is able to do the things she enjoys. She denied any recent panic attacks.The patient denied any suicidal or homicidal thoughts.  Duration of problem: Years; Current Severity of problem: mild  Objective: Mood: Euthymic and Affect: Appropriate Risk of harm to self or others: No plan to harm self or others  Life Context: Family and Social: see above School/Work: see above Self-Care: see above Life Changes:  see above  Patient and/or Family's Strengths/Protective Factors: Concrete supports in place (healthy food, safe environments, etc.)  Goals Addressed: Patient will:  Reduce symptoms of: anxiety, depression, insomnia, and stress   Increase knowledge and/or ability of: coping skills, healthy habits, self-management skills, and stress reduction   Demonstrate ability to: Increase healthy adjustment to current life circumstances  Progress towards Goals: Ongoing  Interventions: Interventions utilized:  CBT Cognitive Behavioral Therapy was utilized by the clinician during today's follow up session. The clinician processed with the patient how they have been doing since the last follow-up session. The clinician provided a space for the patient to ventilate their frustrations regarding their current life circumstances. Clinician measured the patient's anxiety and depression on a numerical scale.  Clinician congratulated the patient on her progress and encouraged her to continue to utilize her coping skills to deal with stressors impacting her life. Clinician explained to the patient that self care is still important and explored ways the patient could incorporate self care into her daily routine. The clinician also explained to the patient that sometimes when people improve and feel better they stop taking their psychotropic medications because they feel like they do not need them any longer, which could lead to a relapse in their condition. Therefore, the clinician encouraged the patient to continue to take their medication at the same time everyday exactly as prescribed for it to reach it's full intended effect and offer the most benefit.     Standardized Assessments completed: GAD-7 and PHQ 9 GAD-7   8  PHQ-9   7   Assessment: Patient currently experiencing see above.   Patient may benefit from see above.  Plan: Follow up with behavioral health clinician on : 01/19/2021 at 2:00 PM Behavioral  recommendations:  Referral(s): [facility] [employer] (In Clinic) "From scale of 1-10, how likely are you to follow plan?":   Judith Part, LCSWA

## 2020-12-22 ENCOUNTER — Other Ambulatory Visit: Payer: Self-pay

## 2020-12-22 ENCOUNTER — Ambulatory Visit: Payer: Self-pay | Admitting: Gerontology

## 2020-12-22 DIAGNOSIS — R7401 Elevation of levels of liver transaminase levels: Secondary | ICD-10-CM

## 2020-12-22 DIAGNOSIS — R809 Proteinuria, unspecified: Secondary | ICD-10-CM

## 2020-12-22 DIAGNOSIS — D75839 Thrombocytosis, unspecified: Secondary | ICD-10-CM

## 2020-12-25 LAB — URINALYSIS
Bilirubin, UA: NEGATIVE
Glucose, UA: NEGATIVE
Ketones, UA: NEGATIVE
Nitrite, UA: NEGATIVE
Protein,UA: NEGATIVE
RBC, UA: NEGATIVE
Specific Gravity, UA: 1.013 (ref 1.005–1.030)
Urobilinogen, Ur: 0.2 mg/dL (ref 0.2–1.0)
pH, UA: 5.5 (ref 5.0–7.5)

## 2020-12-25 LAB — CBC WITH DIFFERENTIAL/PLATELET
Basophils Absolute: 0.1 10*3/uL (ref 0.0–0.2)
Basos: 1 %
EOS (ABSOLUTE): 0.2 10*3/uL (ref 0.0–0.4)
Eos: 3 %
Hematocrit: 38.9 % (ref 34.0–46.6)
Hemoglobin: 13.1 g/dL (ref 11.1–15.9)
Immature Grans (Abs): 0 10*3/uL (ref 0.0–0.1)
Immature Granulocytes: 0 %
Lymphocytes Absolute: 2.3 10*3/uL (ref 0.7–3.1)
Lymphs: 34 %
MCH: 30.7 pg (ref 26.6–33.0)
MCHC: 33.7 g/dL (ref 31.5–35.7)
MCV: 91 fL (ref 79–97)
Monocytes Absolute: 0.4 10*3/uL (ref 0.1–0.9)
Monocytes: 6 %
Neutrophils Absolute: 3.8 10*3/uL (ref 1.4–7.0)
Neutrophils: 56 %
Platelets: 450 10*3/uL (ref 150–450)
RBC: 4.27 x10E6/uL (ref 3.77–5.28)
RDW: 13 % (ref 11.7–15.4)
WBC: 6.7 10*3/uL (ref 3.4–10.8)

## 2020-12-25 LAB — HEPATIC FUNCTION PANEL
ALT: 61 IU/L — ABNORMAL HIGH (ref 0–32)
AST: 23 IU/L (ref 0–40)
Albumin: 4.7 g/dL (ref 3.9–5.0)
Alkaline Phosphatase: 46 IU/L (ref 44–121)
Bilirubin Total: 0.2 mg/dL (ref 0.0–1.2)
Bilirubin, Direct: 0.12 mg/dL (ref 0.00–0.40)
Total Protein: 7.5 g/dL (ref 6.0–8.5)

## 2020-12-28 ENCOUNTER — Other Ambulatory Visit: Payer: Self-pay

## 2020-12-28 ENCOUNTER — Ambulatory Visit: Payer: Self-pay | Admitting: Gerontology

## 2020-12-28 ENCOUNTER — Encounter: Payer: Self-pay | Admitting: Gerontology

## 2020-12-28 VITALS — BP 128/89 | HR 87 | Temp 97.5°F | Ht 62.0 in | Wt 206.7 lb

## 2020-12-28 DIAGNOSIS — G4700 Insomnia, unspecified: Secondary | ICD-10-CM

## 2020-12-28 DIAGNOSIS — R7401 Elevation of levels of liver transaminase levels: Secondary | ICD-10-CM

## 2020-12-28 MED ORDER — MELATONIN 3 MG PO CAPS
3.0000 mg | ORAL_CAPSULE | Freq: Every day | ORAL | 2 refills | Status: DC
Start: 2020-12-28 — End: 2021-05-11
  Filled 2020-12-28: qty 30, 30d supply, fill #0

## 2020-12-28 NOTE — Patient Instructions (Signed)
Insomnia Insomnia is a sleep disorder that makes it difficult to fall asleep or stay asleep. Insomnia can cause fatigue, low energy, difficulty concentrating, moodswings, and poor performance at work or school. There are three different ways to classify insomnia: Difficulty falling asleep. Difficulty staying asleep. Waking up too early in the morning. Any type of insomnia can be long-term (chronic) or short-term (acute). Both are common. Short-term insomnia usually lasts for three months or less. Chronic insomnia occurs at least three times a week for longer than threemonths. What are the causes? Insomnia may be caused by another condition, situation, or substance, such as: Anxiety. Certain medicines. Gastroesophageal reflux disease (GERD) or other gastrointestinal conditions. Asthma or other breathing conditions. Restless legs syndrome, sleep apnea, or other sleep disorders. Chronic pain. Menopause. Stroke. Abuse of alcohol, tobacco, or illegal drugs. Mental health conditions, such as depression. Caffeine. Neurological disorders, such as Alzheimer's disease. An overactive thyroid (hyperthyroidism). Sometimes, the cause of insomnia may not be known. What increases the risk? Risk factors for insomnia include: Gender. Women are affected more often than men. Age. Insomnia is more common as you get older. Stress. Lack of exercise. Irregular work schedule or working night shifts. Traveling between different time zones. Certain medical and mental health conditions. What are the signs or symptoms? If you have insomnia, the main symptom is having trouble falling asleep or having trouble staying asleep. This may lead to other symptoms, such as: Feeling fatigued or having low energy. Feeling nervous about going to sleep. Not feeling rested in the morning. Having trouble concentrating. Feeling irritable, anxious, or depressed. How is this diagnosed? This condition may be diagnosed based  on: Your symptoms and medical history. Your health care provider may ask about: Your sleep habits. Any medical conditions you have. Your mental health. A physical exam. How is this treated? Treatment for insomnia depends on the cause. Treatment may focus on treating an underlying condition that is causing insomnia. Treatment may also include: Medicines to help you sleep. Counseling or therapy. Lifestyle adjustments to help you sleep better. Follow these instructions at home: Eating and drinking  Limit or avoid alcohol, caffeinated beverages, and cigarettes, especially close to bedtime. These can disrupt your sleep. Do not eat a large meal or eat spicy foods right before bedtime. This can lead to digestive discomfort that can make it hard for you to sleep.  Sleep habits  Keep a sleep diary to help you and your health care provider figure out what could be causing your insomnia. Write down: When you sleep. When you wake up during the night. How well you sleep. How rested you feel the next day. Any side effects of medicines you are taking. What you eat and drink. Make your bedroom a dark, comfortable place where it is easy to fall asleep. Put up shades or blackout curtains to block light from outside. Use a white noise machine to block noise. Keep the temperature cool. Limit screen use before bedtime. This includes: Watching TV. Using your smartphone, tablet, or computer. Stick to a routine that includes going to bed and waking up at the same times every day and night. This can help you fall asleep faster. Consider making a quiet activity, such as reading, part of your nighttime routine. Try to avoid taking naps during the day so that you sleep better at night. Get out of bed if you are still awake after 15 minutes of trying to sleep. Keep the lights down, but try reading or doing a quiet   activity. When you feel sleepy, go back to bed.  General instructions Take over-the-counter  and prescription medicines only as told by your health care provider. Exercise regularly, as told by your health care provider. Avoid exercise starting several hours before bedtime. Use relaxation techniques to manage stress. Ask your health care provider to suggest some techniques that may work well for you. These may include: Breathing exercises. Routines to release muscle tension. Visualizing peaceful scenes. Make sure that you drive carefully. Avoid driving if you feel very sleepy. Keep all follow-up visits as told by your health care provider. This is important. Contact a health care provider if: You are tired throughout the day. You have trouble in your daily routine due to sleepiness. You continue to have sleep problems, or your sleep problems get worse. Get help right away if: You have serious thoughts about hurting yourself or someone else. If you ever feel like you may hurt yourself or others, or have thoughts about taking your own life, get help right away. You can go to your nearest emergency department or call: Your local emergency services (911 in the [place] A suicide crisis helpline, such as the National Suicide Prevention Lifeline at 1-800-273-8255. This is open 24 hours a day. Summary Insomnia is a sleep disorder that makes it difficult to fall asleep or stay asleep. Insomnia can be long-term (chronic) or short-term (acute). Treatment for insomnia depends on the cause. Treatment may focus on treating an underlying condition that is causing insomnia. Keep a sleep diary to help you and your health care provider figure out what could be causing your insomnia. This information is not intended to replace advice given to you by your health care provider. Make sure you discuss any questions you have with your healthcare provider. Document Revised: 04/08/2020 Document Reviewed: 04/08/2020 Elsevier Patient Education  2022 [employer]  
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
## 2020-12-29 ENCOUNTER — Telehealth: Payer: Self-pay | Admitting: Pharmacist

## 2020-12-29 NOTE — Telephone Encounter (Signed)
Patient approved for medication assistance at [facility] until 09/10/21, as long as eligibility criteria continues to be met.   Vonda [facility] Administrative Assistant 

## 2020-12-30 ENCOUNTER — Other Ambulatory Visit: Payer: Self-pay | Admitting: Gerontology

## 2020-12-30 DIAGNOSIS — G4700 Insomnia, unspecified: Secondary | ICD-10-CM

## 2020-12-30 MED FILL — Metformin HCl Tab 500 MG: ORAL | 30 days supply | Qty: 60 | Fill #3 | Status: AC

## 2020-12-31 ENCOUNTER — Other Ambulatory Visit: Payer: Self-pay

## 2021-01-03 ENCOUNTER — Other Ambulatory Visit: Payer: Self-pay

## 2021-01-04 ENCOUNTER — Other Ambulatory Visit: Payer: Self-pay

## 2021-01-04 MED ORDER — CITALOPRAM HYDROBROMIDE 40 MG PO TABS
ORAL_TABLET | Freq: Every day | ORAL | 1 refills | Status: DC
  Filled 2021-01-04: qty 15, 30d supply, fill #0
  Filled 2021-02-02: qty 15, 30d supply, fill #1

## 2021-01-04 MED ORDER — TRAZODONE HCL 50 MG PO TABS
ORAL_TABLET | Freq: Every day | ORAL | 0 refills | Status: DC
  Filled 2021-01-04: qty 30, 30d supply, fill #0

## 2021-01-05 ENCOUNTER — Other Ambulatory Visit: Payer: Self-pay

## 2021-01-19 ENCOUNTER — Ambulatory Visit: Payer: Self-pay | Admitting: Licensed Clinical Social Worker

## 2021-01-19 ENCOUNTER — Other Ambulatory Visit: Payer: Self-pay

## 2021-01-19 DIAGNOSIS — G4700 Insomnia, unspecified: Secondary | ICD-10-CM

## 2021-01-19 DIAGNOSIS — F411 Generalized anxiety disorder: Secondary | ICD-10-CM

## 2021-01-19 DIAGNOSIS — F331 Major depressive disorder, recurrent, moderate: Secondary | ICD-10-CM

## 2021-01-19 NOTE — BH Specialist Note (Signed)
Integrated Behavioral Health Follow Up In-Person Visit  MRN: [id number] Name: Latoya Benson    Total time: 60 minutes  Types of Service: Telephone visit Patient consents to telephone visit and 2 patient identifiers were used to identify patient   Interpretor:No. Interpretor Name and Language: N/A  Subjective: Latoya Benson is a 25 y.o. female accompanied by  herself Patient was referred by [facility] for [facility]. Patient reports the following symptoms/concerns: The patient reports that she has been doing better since her last follow-up session. She explained that she has been having trouble falling asleep since she stopped taking Tylenol pm before bed. She reports that she takes Trazodone 50 MG before bedtime, but just lays there for hours. She explained that once she falls asleep she stays asleep and does not have night waking, nightmares, or snoring problems. She shared that she is starting a new job on Monday and is very excited because she has been trying to get hired with this company for over a year. She noted many positive things are happening in her life and that her relationships with her husband and mother have improved greatly. She noted that other than sleep difficulties she is doing well. The patient denied any suicidal or homicidal thoughts.  Duration of problem: Years; Severity of problem: moderate  Objective: Mood: Euthymic and Affect: Appropriate Risk of harm to self or others: No plan to harm self or others  Life Context: Family and Social: see above School/Work: see above Self-Care: see above Life Changes: see above  Patient and/or Family's Strengths/Protective Factors: Concrete supports in place (healthy food, safe environments, etc.) and Caregiver has knowledge of parenting & child development  Goals Addressed: Patient will:  Reduce symptoms of: agitation, anxiety, depression, insomnia, and stress   Increase knowledge and/or ability of: coping skills,  healthy habits, self-management skills, and stress reduction   Demonstrate ability to: Increase healthy adjustment to current life circumstances  Progress towards Goals: Ongoing  Interventions: Interventions utilized:  CBT Cognitive Behavioral Therapy was utilized by the clinician during today's follow up session. The clinician processed with the patient how they have been doing since the last follow-up session. The clinician provided a space for the patient to ventilate their frustrations regarding their current life circumstances. Clinician measured the patient's anxiety and depression on a numerical scale.  Clinician encouraged the patient to take their mediation at the same time everyday exactly as prescribed for it to reach it's full intended effect. The clinician encouraged the patient to utilize their coping skills to deal with their current life circumstances. Clinician offered to take the patient's case for consultation with Dr. Mare Ferrari, MD, Psychiatric Consultant on Tuesday 01/25/2021 and call her with the recommendations that afternoon.     Standardized Assessments completed: GAD-7 and PHQ 9 GAD-7    13 PHQ-9     11  Assessment: Patient currently experiencing see above.   Patient may benefit from see above.  Plan: Follow up with behavioral health clinician on : two weeks Behavioral recommendations:  Referral(s): Integrated [employer] (In Clinic) "From scale of 1-10, how likely are you to follow plan?":   Judith Part, LCSWA

## 2021-01-25 ENCOUNTER — Other Ambulatory Visit: Payer: Self-pay

## 2021-01-25 ENCOUNTER — Other Ambulatory Visit: Payer: Self-pay | Admitting: Gerontology

## 2021-01-25 DIAGNOSIS — G4700 Insomnia, unspecified: Secondary | ICD-10-CM

## 2021-01-25 MED ORDER — TRAZODONE HCL 100 MG PO TABS
100.0000 mg | ORAL_TABLET | Freq: Every day | ORAL | 0 refills | Status: DC
  Filled 2021-01-25: qty 30, 30d supply, fill #0

## 2021-01-28 ENCOUNTER — Encounter: Payer: Self-pay | Admitting: Obstetrics and Gynecology

## 2021-02-02 ENCOUNTER — Other Ambulatory Visit: Payer: Self-pay | Admitting: Obstetrics and Gynecology

## 2021-02-02 ENCOUNTER — Other Ambulatory Visit: Payer: Self-pay

## 2021-02-02 DIAGNOSIS — E8881 Metabolic syndrome: Secondary | ICD-10-CM

## 2021-02-02 DIAGNOSIS — E282 Polycystic ovarian syndrome: Secondary | ICD-10-CM

## 2021-02-03 ENCOUNTER — Other Ambulatory Visit: Payer: Self-pay

## 2021-02-03 MED ORDER — METFORMIN HCL 500 MG PO TABS
ORAL_TABLET | ORAL | 5 refills | Status: DC
  Filled 2021-02-03: qty 60, 30d supply, fill #0
  Filled 2021-02-21 – 2021-03-02 (×3): qty 60, 30d supply, fill #1
  Filled 2021-03-31: qty 60, 30d supply, fill #2
  Filled 2021-04-14 – 2021-05-09 (×3): qty 60, 30d supply, fill #3
  Filled 2021-06-06: qty 60, 30d supply, fill #4
  Filled 2021-07-06: qty 60, 30d supply, fill #5

## 2021-02-04 ENCOUNTER — Encounter: Payer: Self-pay | Admitting: Obstetrics and Gynecology

## 2021-02-16 ENCOUNTER — Other Ambulatory Visit: Payer: Self-pay

## 2021-02-16 DIAGNOSIS — R7401 Elevation of levels of liver transaminase levels: Secondary | ICD-10-CM

## 2021-02-17 LAB — HEPATIC FUNCTION PANEL
ALT: 20 IU/L (ref 0–32)
AST: 14 IU/L (ref 0–40)
Albumin: 4 g/dL (ref 3.9–5.0)
Alkaline Phosphatase: 41 IU/L — ABNORMAL LOW (ref 44–121)
Bilirubin Total: 0.2 mg/dL (ref 0.0–1.2)
Bilirubin, Direct: <0.1 mg/dL (ref 0.00–0.40)
Total Protein: 6.7 g/dL (ref 6.0–8.5)

## 2021-02-17 LAB — HCV INTERPRETATION

## 2021-02-17 LAB — ACUTE VIRAL HEPATITIS (HAV, HBV, HCV)
HCV Ab: <0.1 s/co ratio (ref 0.0–0.9)
Hep A IgM: NEGATIVE
Hep B C IgM: NEGATIVE
Hepatitis B Surface Ag: NEGATIVE

## 2021-02-21 ENCOUNTER — Other Ambulatory Visit: Payer: Self-pay | Admitting: Gerontology

## 2021-02-21 ENCOUNTER — Other Ambulatory Visit: Payer: Self-pay

## 2021-02-21 DIAGNOSIS — G4700 Insomnia, unspecified: Secondary | ICD-10-CM

## 2021-02-21 DIAGNOSIS — M1A09X0 Idiopathic chronic gout, multiple sites, without tophus (tophi): Secondary | ICD-10-CM

## 2021-02-22 ENCOUNTER — Other Ambulatory Visit: Payer: Self-pay

## 2021-02-22 MED ORDER — CITALOPRAM HYDROBROMIDE 40 MG PO TABS
ORAL_TABLET | Freq: Every day | ORAL | 1 refills | Status: DC
  Filled 2021-02-22 – 2021-02-23 (×2): qty 15, fill #0
  Filled 2021-03-02: qty 15, 30d supply, fill #0

## 2021-02-22 MED ORDER — TRAZODONE HCL 100 MG PO TABS
100.0000 mg | ORAL_TABLET | Freq: Every day | ORAL | 0 refills | Status: DC
Start: 2021-02-22 — End: 2021-03-31
  Filled 2021-02-22 – 2021-02-23 (×2): qty 30, 30d supply, fill #0

## 2021-02-22 MED ORDER — ALLOPURINOL 100 MG PO TABS
ORAL_TABLET | Freq: Two times a day (BID) | ORAL | 3 refills | Status: DC
  Filled 2021-02-22 – 2021-02-23 (×2): qty 60, fill #0
  Filled 2021-03-02: qty 60, 30d supply, fill #0
  Filled 2021-03-31: qty 60, 30d supply, fill #1
  Filled 2021-04-14 – 2021-05-09 (×3): qty 60, 30d supply, fill #2
  Filled 2021-06-06: qty 60, 30d supply, fill #3

## 2021-02-23 ENCOUNTER — Ambulatory Visit: Payer: Self-pay | Admitting: Gerontology

## 2021-02-23 ENCOUNTER — Encounter: Payer: Self-pay | Admitting: Gerontology

## 2021-02-23 VITALS — BP 103/73 | HR 87 | Temp 98.2°F | Resp 16 | Ht 61.0 in | Wt 210.4 lb

## 2021-02-23 DIAGNOSIS — Z6838 Body mass index (BMI) 38.0-38.9, adult: Secondary | ICD-10-CM

## 2021-02-23 DIAGNOSIS — E669 Obesity, unspecified: Secondary | ICD-10-CM

## 2021-02-23 DIAGNOSIS — Z8659 Personal history of other mental and behavioral disorders: Secondary | ICD-10-CM
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
## 2021-02-24 ENCOUNTER — Other Ambulatory Visit: Payer: Self-pay

## 2021-03-01 ENCOUNTER — Ambulatory Visit: Payer: Self-pay | Admitting: Gerontology

## 2021-03-02 ENCOUNTER — Other Ambulatory Visit: Payer: Self-pay

## 2021-03-08 ENCOUNTER — Telehealth: Payer: Self-pay | Admitting: Licensed Clinical Social Worker

## 2021-03-08 NOTE — Telephone Encounter (Signed)
Returned the patient's call. Set a telephone appointment to discuss her concerns further on Thursday 03/10/2021 at 10:00 AM.

## 2021-03-10 ENCOUNTER — Ambulatory Visit: Payer: Self-pay | Admitting: Licensed Clinical Social Worker

## 2021-03-10 ENCOUNTER — Other Ambulatory Visit: Payer: Self-pay

## 2021-03-10 DIAGNOSIS — F330 Major depressive disorder, recurrent, mild: Secondary | ICD-10-CM

## 2021-03-10 DIAGNOSIS — F411 Generalized anxiety disorder: Secondary | ICD-10-CM

## 2021-03-10 NOTE — BH Specialist Note (Signed)
Integrated Behavioral Health Follow Up Telephone Visit  MRN: [id number] Name: Latoya Benson  Total time: 60 minutes  Types of Service: Telephone visit Patient consents to telephone visit and 2 patient identifiers were used to identify patient   Interpretor:No. Interpretor Name and Language: N/A  Subjective: Latoya Benson is a 25 y.o. female accompanied by  herself Patient was referred by Carlyon Shadow, NP for Mental Health. Patient reports the following symptoms/concerns: The patient reports that she has been mostly doing better since her last follow-up appointment. She shared that she had a panic attack while at work last Monday. 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 She stated she tried to walk in off but a couple of minutes later she felt her heart begin to race, became nauseous, couldn't breat, and felt intense fear. She noted that she thought something was very wrong and had thoughts of dying. She shared her coworkers were asking her if she was okay, and went and told her boss who came and asked more questions. Latoya Benson noted she struggled to focus on the questions and her boss sent her home and told her to call her doctor. The patient stated she went home and took 20 MG of Celexa and went to sleep. She explained that she has not experienced another panic attack, but has felt on edge and finds herself worrying she is about to have another panic attack. She shared that everything in her life has been going well and she has recently moved into a new home. Latoya Benson denied any suicidal or homicidal thoughts.  Duration of problem: Years; Severity of problem: moderate  Objective: Mood: Euthymic and Affect: Appropriate Risk of harm to self or others: No plan to harm self or others  Life Context: Family and Social: see above School/Work: see  above Self-Care: see above Life Changes: see above  Patient and/or Family's Strengths/Protective Factors: Concrete supports in place (healthy food, safe environments, etc.)  Goals Addressed: Patient will:  Reduce symptoms of: agitation, anxiety, depression, and stress   Increase knowledge and/or ability of: coping skills, healthy habits, self-management skills, and stress reduction   Demonstrate ability to: Increase healthy adjustment to current life circumstances and Increase adequate support systems for patient/family  Progress towards Goals: Ongoing  Interventions: Interventions utilized:  CBT Cognitive Behavioral Therapy was utilized by the clinician during today's follow up session. The clinician processed with the patient how they have been doing since the last follow-up session. The clinician provided a space for the patient to ventilate their frustrations regarding their current life circumstances. Clinician met with patient to identify needs related to stressors and functioning and assessed and monitored for signs and symptoms of depression and anxiety and assess safety. The clinician encouraged the patient to utilize their coping skills to deal with their current life circumstances. Clinician discussed distress tolerance and introduced additional skills to the patient and explained that negative emotions will usually lessen in intensity and pass over time. Clinician measured the patient's anxiety and depression on a numerical scale.   Standardized Assessments completed: GAD-7 and PHQ 9 PHQ-9 =   5 GAD-7 =  12   Assessment: Patient currently experiencing see above.   Patient may benefit from see above.  Plan: Follow up with behavioral health clinician on : 03/17/2021 at 11:00 AM  Behavioral recommendations:  Referral(s): Integrated Behavioral  Health Services (In Clinic) "From scale of 1-10, how likely are you to follow plan?":   Lesli Albee, LCSWA

## 2021-03-15 ENCOUNTER — Other Ambulatory Visit: Payer: Self-pay | Admitting: Gerontology

## 2021-03-15 ENCOUNTER — Telehealth: Payer: Self-pay | Admitting: Licensed Clinical Social Worker

## 2021-03-15 ENCOUNTER — Other Ambulatory Visit: Payer: Self-pay

## 2021-03-15 DIAGNOSIS — F330 Major depressive disorder, recurrent, mild: Secondary | ICD-10-CM

## 2021-03-15 MED ORDER — CITALOPRAM HYDROBROMIDE 10 MG PO TABS
10.0000 mg | ORAL_TABLET | Freq: Every day | ORAL | 0 refills | Status: DC
  Filled 2021-03-15: qty 30, 30d supply, fill #0

## 2021-03-15 MED ORDER — CITALOPRAM HYDROBROMIDE 20 MG PO TABS
20.0000 mg | ORAL_TABLET | Freq: Every day | ORAL | 0 refills | Status: DC
  Filled 2021-03-15: qty 30, 30d supply, fill #0

## 2021-03-15 NOTE — Telephone Encounter (Signed)
Left message requesting patient return call to confirm Thursday morning's phone appointment with [facility].

## 2021-03-17 ENCOUNTER — Ambulatory Visit: Payer: Self-pay | Admitting: Licensed Clinical Social Worker

## 2021-03-29 ENCOUNTER — Ambulatory Visit (LOCAL_COMMUNITY_HEALTH_CENTER): Payer: Self-pay

## 2021-03-29 ENCOUNTER — Other Ambulatory Visit: Payer: Self-pay

## 2021-03-29 DIAGNOSIS — Z111 Encounter for screening for respiratory tuberculosis: Secondary | ICD-10-CM

## 2021-03-29 NOTE — Progress Notes (Signed)
Patient has a form to be filled out located with her PPDR slip for her employer. Hart Carwin, RN

## 2021-03-31 ENCOUNTER — Other Ambulatory Visit: Payer: Self-pay

## 2021-03-31 ENCOUNTER — Ambulatory Visit: Payer: Self-pay | Admitting: Licensed Clinical Social Worker

## 2021-03-31 ENCOUNTER — Other Ambulatory Visit: Payer: Self-pay | Admitting: Gerontology

## 2021-03-31 DIAGNOSIS — G4700 Insomnia, unspecified: Secondary | ICD-10-CM

## 2021-03-31 DIAGNOSIS — F330 Major depressive disorder, recurrent, mild: Secondary | ICD-10-CM

## 2021-03-31 DIAGNOSIS — F411 Generalized anxiety disorder: Secondary | ICD-10-CM

## 2021-03-31 NOTE — BH Specialist Note (Signed)
Integrated Behavioral Health Follow Up Telephone visit  MRN: [id number] Name: Latoya Benson  Total time: 60 minutes  Types of Service: Telephone visit Patient consents to telephone visit and 2 patient identifiers were used to identify patient   Interpretor:No. Interpretor Name and Language: N/A  Subjective: Latoya Benson is a 25 y.o. female accompanied by  herself Patient was referred by Carlyon Shadow, NP for N/A. Patient reports the following symptoms/concerns: The patient reports that she is doing better since her last follow-up visit. She shared that today is the 3rd anniversary of her grandfather's passing. She noted that her grandmother and family honor his life and contribution to their family by gathering together to share a meal of all his favorite foods and exchange stories of his life. She explained that it is a hard day for her but she is so grateful to celebrate his life and that when he died her family works to keep his memory alive. 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 She explained that she has a job offer and is just waiting for her TB test to come back. Latoya Benson reported that she is taking Celexa 30 MG daily and Trazodone 100 MG  before bedtime. She shared that she put in a refill request today for her Trazodone. The patient stated that overall her mood has improved and she has not had any further panic attacks since her last follow-up appointment. Latoya Benson denied any suicidal or homicidal thoughts.  Duration of problem: Years; Severity of problem: moderate  Objective: Mood: Euthymic and Affect: Appropriate Risk of harm to self or others: No plan to harm self or others  Life Context: Family and Social: see above School/Work: see above Self-Care: see above Life Changes: see above  Patient and/or Family's Strengths/Protective Factors: Concrete supports in place (healthy food, safe  environments, etc.)  Goals Addressed: Patient will:  Reduce symptoms of: agitation, depression, insomnia, and stress   Increase knowledge and/or ability of: coping skills, healthy habits, self-management skills, and stress reduction   Demonstrate ability to: Increase healthy adjustment to current life circumstances and Increase adequate support systems for patient/family  Progress towards Goals: Ongoing  Interventions: Interventions utilized:  CBT Cognitive Behavioral Therapywas utilized by the clinician during today's follow up session. Clinician met with patient to identify needs related to stressors and functioning, and assess and monitor for signs and symptoms of anxiety and depression, and assess safety. The clinician processed with the patient how they have been doing since the last follow-up session. Clinician encouraged the client to continue to use her coping skills to deal with her current life circumstances and to work to increase her use of self care to every day. Standardized Assessments completed: GAD-7 and PHQ 9 GAD-7 = 14 PHQ-9 =   9 Assessment: Patient currently experiencing see above.   Patient may benefit from see above.  Plan: Follow up with behavioral health clinician on : 04/14/2021 at 5:00 PM Behavioral recommendations:  Referral(s): [place] (In Clinic) "From scale of 1-10, how likely are you to follow plan?":   Lesli Albee, LCSWA

## 2021-04-01 ENCOUNTER — Other Ambulatory Visit: Payer: Self-pay

## 2021-04-01 ENCOUNTER — Ambulatory Visit (LOCAL_COMMUNITY_HEALTH_CENTER): Payer: Self-pay

## 2021-04-01 DIAGNOSIS — Z111 Encounter for screening for respiratory tuberculosis: Secondary | ICD-10-CM

## 2021-04-01 LAB — TB SKIN TEST
Induration: 0 mm
TB Skin Test: NEGATIVE

## 2021-04-04 ENCOUNTER — Other Ambulatory Visit: Payer: Self-pay

## 2021-04-05 ENCOUNTER — Other Ambulatory Visit: Payer: Self-pay

## 2021-04-08 ENCOUNTER — Other Ambulatory Visit: Payer: Self-pay

## 2021-04-13 ENCOUNTER — Other Ambulatory Visit: Payer: Self-pay

## 2021-04-14 ENCOUNTER — Other Ambulatory Visit: Payer: Self-pay

## 2021-04-14 ENCOUNTER — Ambulatory Visit (LOCAL_COMMUNITY_HEALTH_CENTER): Payer: Self-pay

## 2021-04-14 ENCOUNTER — Ambulatory Visit: Payer: Self-pay | Admitting: Licensed Clinical Social Worker

## 2021-04-14 ENCOUNTER — Other Ambulatory Visit: Payer: Self-pay | Admitting: Gerontology

## 2021-04-14 VITALS — BP 119/80 | Ht 61.0 in | Wt 215.0 lb

## 2021-04-14 DIAGNOSIS — F330 Major depressive disorder, recurrent, mild: Secondary | ICD-10-CM

## 2021-04-14 DIAGNOSIS — F411 Generalized anxiety disorder: Secondary | ICD-10-CM

## 2021-04-14 DIAGNOSIS — Z3009 Encounter for other general counseling and advice on contraception: Secondary | ICD-10-CM

## 2021-04-14 DIAGNOSIS — Z3041 Encounter for surveillance of contraceptive pills: Secondary | ICD-10-CM

## 2021-04-14 MED ORDER — CITALOPRAM HYDROBROMIDE 10 MG PO TABS
10.0000 mg | ORAL_TABLET | Freq: Every day | ORAL | 0 refills | Status: DC
  Filled 2021-04-14: qty 30, 30d supply, fill #0

## 2021-04-14 MED ORDER — CITALOPRAM HYDROBROMIDE 20 MG PO TABS
20.0000 mg | ORAL_TABLET | Freq: Every day | ORAL | 0 refills | Status: DC
  Filled 2021-04-14: qty 30, 30d supply, fill #0

## 2021-04-14 MED ORDER — TRAZODONE HCL 100 MG PO TABS
100.0000 mg | ORAL_TABLET | Freq: Every day | ORAL | 0 refills | Status: DC
  Filled 2021-04-14: qty 30, 30d supply, fill #0

## 2021-04-14 MED ORDER — NORGESTIMATE-ETH ESTRADIOL 0.25-35 MG-MCG PO TABS: 1.0000 | ORAL_TABLET | Freq: Every day | ORAL | 0 refills | Status: DC

## 2021-04-14 NOTE — BH Specialist Note (Signed)
Integrated Behavioral Health Follow Up Telephone Visit  MRN: [id number] Name: Latoya Benson   Total time: 60 minutes  Types of Service: Telephone visit Patient consents to telephone visit and 2 patient identifiers were used to identify patient   Interpretor:No. Interpretor Name and Language: N/A  Subjective: Latoya Benson is a 25 y.o. female accompanied by  herself Patient was referred by Carlyon Shadow, NP for Mental Health. Patient reports the following symptoms/concerns: The patient reports that she has been okay since her last follow-up appointment. 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 She discussed family stressors impacting her life. She noted that she has been doing okay sleeping, but had difficulty getting her psychotropic medications refilled. She stated that she was starting to panic because the pharmacy had not reached out to her and she could not call during normal clinic hours due to her schedule. The patient discussed family stressors impacting her life. She noted that her husband working out of town has helped some. She noted that she continues to feel anxious especially when she feels her heart rate change. She reports that she has improved her self care routine some, but has more room to grow. The patient discussed work related stressors. She shared she had one incident of struggling to control her anger, but noted she could see her progress because she showed some restraint and "did not go completely off on this guy." Yilin denied any recent panic attacks and has not experienced any suicidal or homicidal thoughts.  Duration of problem: Years; Severity of problem: moderate  Objective: Mood: Euthymic and Affect: Appropriate Risk of harm to self or others: No plan to harm self or others  Life Context: Family and Social: see above School/Work: see above Self-Care: see above Life Changes: see above  Patient and/or Family's  Strengths/Protective Factors: Social connections, Concrete supports in place (healthy food, safe environments, etc.), and Sense of purpose  Goals Addressed: Patient will:  Reduce symptoms of: agitation, anxiety, depression, insomnia, and stress   Increase knowledge and/or ability of: coping skills, healthy habits, self-management skills, and stress reduction   Demonstrate ability to: Increase healthy adjustment to current life circumstances and Increase adequate support systems for patient/family  Progress towards Goals: Ongoing  Interventions: Interventions utilized:  CBT Cognitive Behavioral Therapywas utilized by the clinician during today's follow up session. Clinician met with patient to identify needs related to stressors and functioning, and assess and monitor for signs and symptoms of anxiety and depression, and assess safety. The clinician processed with the patient how they have been doing since the last follow-up session. Clinician measured the client's anxiety and depression on a numerical scale. Clinician provided the patient with her contact information in case the client experienced difficulties  refilling her psychotropic medication or needed help to reschedule her IBH appointments. Clinician reviewed distress tolerance skills with the patient and provided examples for her to practice this week as homework. Session ended with scheduling. Standardized Assessments completed: GAD-7 and PHQ 9 GAD-7 =   9 PHQ-9 =   5  Assessment: Patient currently experiencing see above.   Patient may benefit from see above.  Plan: Follow up with behavioral health clinician on : 05/03/21 at 5:00 PM Behavioral recommendations:  Referral(s): [place] (In Clinic) "From scale of 1-10, how likely are you to follow plan?":   Lesli Albee, LCSWA

## 2021-04-14 NOTE — Progress Notes (Signed)
In Nurse Clinic for ocp (Sprintec) refill. States she is on last pack of Sprintec and has 2 weeks left in current pack. Denies missing any pills. Takes daily at same time. Sprintec #3 packs dispensed today to complete order by Ralene Bathe, MD dated 08/09/2020. Label placed on pack for pt to use last to remind her to call for annual PE before runs out of pills. Questions answered and reports understanding. Per pt request, copy of PPDR given from visit 04/01/21. Jerel Shepherd, RN

## 2021-04-28 ENCOUNTER — Other Ambulatory Visit: Payer: Self-pay

## 2021-04-28 ENCOUNTER — Telehealth: Payer: Self-pay | Admitting: Family Medicine

## 2021-04-28 ENCOUNTER — Encounter: Payer: Self-pay | Admitting: Family Medicine

## 2021-04-28 DIAGNOSIS — R6889 Other general symptoms and signs: Secondary | ICD-10-CM

## 2021-04-28 MED ORDER — OSELTAMIVIR PHOSPHATE 75 MG PO CAPS
75.0000 mg | ORAL_CAPSULE | Freq: Two times a day (BID) | ORAL | 0 refills | Status: AC
  Filled 2021-04-28: qty 10, 5d supply, fill #0

## 2021-04-28 NOTE — Patient Instructions (Addendum)
I appreciate the opportunity to provide you with care for your health and wellness.  Take mediation as directed   I hope you feel better. Work note to return Monday 05/02/21  Please continue to practice social distancing to keep you, your family, and our community safe.  If you must go out, please wear a mask and practice good handwashing.  Have a wonderful day. With Gratitude, Tereasa Coop, DNP, AGNP-BC

## 2021-04-28 NOTE — Progress Notes (Signed)
Virtual Visit Consent   [name], you are scheduled for a virtual visit with a [facility] provider today.     Just as with appointments in the office, your consent must be obtained to participate.  Your consent will be active for this visit and any virtual visit you may have with one of our providers in the next 365 days.     If you have a MyChart account, a copy of this consent can be sent to you electronically.  All virtual visits are billed to your insurance company just like a traditional visit in the office.    As this is a virtual visit, video technology does not allow for your provider to perform a traditional examination.  This may limit your provider's ability to fully assess your condition.  If your provider identifies any concerns that need to be evaluated in person or the need to arrange testing (such as labs, EKG, etc.), we will make arrangements to do so.     Although advances in technology are sophisticated, we cannot ensure that it will always work on either your end or our end.  If the connection with a video visit is poor, the visit may have to be switched to a telephone visit.  With either a video or telephone visit, we are not always able to ensure that we have a secure connection.     I need to obtain your verbal consent now.   Are you willing to proceed with your visit today?    MIDORI SLACUM has provided verbal consent on 04/28/2021 for a virtual visit (video or telephone).   Perlie Mayo, NP   Date: 04/28/2021 9:25 AM   Virtual Visit via Video Note   I, Perlie Mayo, connected with  Latoya Benson  ([id number], March 16, 1996) on 04/28/21 at  9:30 AM EST by a video-enabled telemedicine application and verified that I am speaking with the correct person using two identifiers.  Location: Patient: Virtual Visit Location Patient: Home Provider: Virtual Visit Location Provider: Home Office   I discussed the limitations of evaluation and management by  telemedicine and the availability of in person appointments. The patient expressed understanding and agreed to proceed.    History of Present Illness: Latoya Benson is a 25 y.o. who identifies as a female who was assigned female at birth, and is being seen today for flu like symptoms that started on Monday. Works in a daycare. Positive cases of flu and RSV going around.  HPI: Influenza This is a new problem. The current episode started yesterday. The problem has been gradually worsening. Associated symptoms include anorexia, a change in bowel habit, chills, congestion, coughing, fatigue, a fever, headaches, myalgias, nausea, a sore throat and vomiting. Pertinent negatives include no abdominal pain, chest pain or swollen glands. Associated symptoms comments: T max 101.1 Diarrhea as well as n/v. The symptoms are aggravated by coughing. She has tried acetaminophen, eating, position changes, rest and sleep (tried OTC meds flu and sinus meds) for the symptoms. The treatment provided no relief.   Problems:  Patient Active Problem List   Diagnosis Date Noted   Elevated alanine aminotransferase (ALT) level 11/24/2020   Proteinuria 11/24/2020   Diarrhea 11/24/2020   Vitamin D deficiency 07/29/2020   Low vitamin B12 level 07/29/2020   Thrombocytosis 07/29/2020   Numbness and tingling in both hands 07/07/2020   Insomnia 07/07/2020   History of PCOS 05/05/2020   Prediabetes 05/05/2020   Myopia of both  eyes 05/05/2020   History of depression 05/05/2020   ASCUS of cervix with negative high risk HPV 08/15/2019   Class 2 obesity with body mass index (BMI) of 38.0 to 38.9 in adult 08/07/2019   Pyelonephritis 07/31/2017   Sepsis ([place]) 04/02/2016    Allergies: No Known Allergies Medications:  Current Outpatient Medications:    allopurinol (ZYLOPRIM) 100 MG tablet, TAKE ONE TABLET BY MOUTH 2 TIMES A DAY, Disp: 60 tablet, Rfl: 3   Cholecalciferol (VITAMIN D3 PO), Take 1 tablet by mouth daily., Disp:  , Rfl:    citalopram (CELEXA) 10 MG tablet, Take 1 tablet (10 mg total) by mouth once daily., Disp: 30 tablet, Rfl: 0   citalopram (CELEXA) 20 MG tablet, Take 1 tablet (20 mg total) by mouth once daily., Disp: 30 tablet, Rfl: 0   Melatonin 3 MG CAPS, Take 1 capsule (3 mg total) by mouth at bedtime., Disp: 30 capsule, Rfl: 2   metFORMIN (GLUCOPHAGE) 500 MG tablet, TAKE ONE TABLET BY MOUTH 2 TIMES A DAY., Disp: 60 tablet, Rfl: 5   norgestimate-ethinyl estradiol (ORTHO-CYCLEN) 0.25-35 MG-MCG tablet, Take 1 tablet by mouth daily., Disp: 28 tablet, Rfl: 0   norgestimate-ethinyl estradiol (SPRINTEC 28) 0.25-35 MG-MCG tablet, Take 1 tablet by mouth daily., Disp: 28 tablet, Rfl: 13   ondansetron (ZOFRAN) 4 MG tablet, Take 1 tablet (4 mg total) by mouth daily as needed for nausea or vomiting. (Patient not taking: Reported on 03/29/2021), Disp: 20 tablet, Rfl: 0   traZODone (DESYREL) 100 MG tablet, Take 1 tablet (100 mg total) by mouth once daily at bedtime., Disp: 30 tablet, Rfl: 0  Observations/Objective: Patient is well-developed, well-nourished in no acute distress.  Resting comfortably  at home.  Head is normocephalic, atraumatic.  No labored breathing.  Speech is clear and coherent with logical content.  Patient is alert and oriented at baseline.    Assessment and Plan: 1. Flu-like symptoms Works in a day care most likely symptoms are viral related Will proactively treat for flu given fevers and symptoms today  Work note provided OTC measures reviewed  - oseltamivir (TAMIFLU) 75 MG capsule; Take 1 capsule (75 mg total) by mouth 2 (two) times daily for 5 days.  Dispense: 10 capsule; Refill: 0   Reviewed side effects, risks and benefits of medication.    Patient acknowledged agreement and understanding of the plan.   I discussed the assessment and treatment plan with the patient. The patient was provided an opportunity to ask questions and all were answered. The patient agreed with the  plan and demonstrated an understanding of the instructions.   The patient was advised to call back or seek an in-person evaluation if the symptoms worsen or if the condition fails to improve as anticipated.   The above assessment and management plan was discussed with the patient. The patient verbalized understanding of and has agreed to the management plan. Patient is aware to call the clinic if symptoms persist or worsen. Patient is aware when to return to the clinic for a follow-up visit. Patient educated on when it is appropriate to go to the emergency department.   Follow Up Instructions: I discussed the assessment and treatment plan with the patient. The patient was provided an opportunity to ask questions and all were answered. The patient agreed with the plan and demonstrated an understanding of the instructions.  A copy of instructions were sent to the patient via MyChart unless otherwise noted below.     The patient was advised  to call back or seek an in-person evaluation if the symptoms worsen or if the condition fails to improve as anticipated.  Time:  I spent 10 minutes with the patient via telehealth technology discussing the above problems/concerns.    Freddy Finner, NP

## 2021-05-03 ENCOUNTER — Ambulatory Visit: Payer: Self-pay | Admitting: Licensed Clinical Social Worker

## 2021-05-09 ENCOUNTER — Other Ambulatory Visit: Payer: Self-pay

## 2021-05-09 ENCOUNTER — Other Ambulatory Visit: Payer: Self-pay | Admitting: Gerontology

## 2021-05-09 DIAGNOSIS — G4700 Insomnia, unspecified: Secondary | ICD-10-CM

## 2021-05-09 DIAGNOSIS — F330 Major depressive disorder, recurrent, mild: Secondary | ICD-10-CM

## 2021-05-10 ENCOUNTER — Other Ambulatory Visit: Payer: Self-pay

## 2021-05-10 ENCOUNTER — Ambulatory Visit: Payer: Self-pay | Admitting: Licensed Clinical Social Worker

## 2021-05-10 DIAGNOSIS — F330 Major depressive disorder, recurrent, mild: Secondary | ICD-10-CM

## 2021-05-10 DIAGNOSIS — F411 Generalized anxiety disorder: Secondary | ICD-10-CM

## 2021-05-10 NOTE — BH Specialist Note (Signed)
Integrated Behavioral Health Follow Up Telephone Visit  MRN: [id number] Name: Latoya Benson  Total time: 30 minutes  Types of Service: Telephone visitPatient consents to telephone visit and 2 patient identifiers were used to identify patient   Interpretor:Yes.   Interpretor Name and Language: N/A  Subjective: Latoya Benson is a 25 y.o. female accompanied by  herself  Patient was referred by Carlyon Shadow, NP  for Mental Health. Patient reports the following symptoms/concerns: The patient reports that she has been doing well since her last follow-up appointment. 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 The patient discussed financial stressors impacting her life. She stated that her husbands work schedule has positively impacted their relationship because they have time to miss each other now. She shared that everything in her life is going well at the moment. The patient denied any panic attacks, loss of temper, or sleep disturbances currently.She stated she had a good Thanksgiving and was looking forward to Christmas. Amena denied any suicidal or homicidal thoughts. Patient needed to end the session early due to work.  Duration of problem: Years; Severity of problem: moderate  Objective: Mood: Euthymic and Affect: Appropriate Risk of harm to self or others: No plan to harm self or others  Life Context: Family and Social: see above School/Work: see above Self-Care: see above Life Changes: see above  Patient and/or Family's Strengths/Protective Factors: Social and Emotional competence and Concrete supports in place (healthy food, safe environments, etc.)  Goals Addressed: Patient will:  Reduce symptoms of: agitation, anxiety, depression, and stress   Increase knowledge and/or ability of: coping skills, healthy habits, self-management skills, and stress  reduction   Demonstrate ability to: Increase healthy adjustment to current life circumstances and Increase adequate support systems for patient/family  Progress towards Goals: Ongoing  Interventions: Interventions utilized:  CBT Cognitive Behavioral Therapywas utilized by the clinician during today's follow up session. Clinician met with patient to identify needs related to stressors and functioning, and assess and monitor for signs and symptoms of anxiety and depression, and assess safety. The clinician processed with the patient how they have been doing since the last follow-up session. Clinician encouraged the patient to continue to work on calming techniques (eg. paced breathing, deep muscle relaxation, and calming imagery) as a strategy for responding appropriately to anxiety and the urge to avoid situations or self-isolate when they occur and move towards increasing the patients self regulation. Session ended with scheduling.    Standardized Assessments completed:  unable to complete at this session due to patient needing to return to work.    Assessment: Patient currently experiencing see above.   Patient may benefit from see above.  Plan: Follow up with behavioral health clinician on : 06/15/2021 at 6:00 PM Behavioral recommendations:  Referral(s): [place] (In Clinic) "From scale of 1-10, how likely are you to follow plan?":   Lesli Albee, LCSWA

## 2021-05-11 ENCOUNTER — Other Ambulatory Visit: Payer: Self-pay

## 2021-05-11 ENCOUNTER — Other Ambulatory Visit: Payer: Self-pay | Admitting: Gerontology

## 2021-05-11 DIAGNOSIS — G4700 Insomnia, unspecified: Secondary | ICD-10-CM

## 2021-05-11 DIAGNOSIS — F330 Major depressive disorder, recurrent, mild: Secondary | ICD-10-CM

## 2021-05-11 MED ORDER — TRAZODONE HCL 100 MG PO TABS
100.0000 mg | ORAL_TABLET | Freq: Every day | ORAL | 0 refills | Status: DC
  Filled 2021-05-11: qty 30, 30d supply, fill #0

## 2021-05-11 MED ORDER — CITALOPRAM HYDROBROMIDE 10 MG PO TABS
10.0000 mg | ORAL_TABLET | Freq: Every day | ORAL | 0 refills | Status: DC
  Filled 2021-05-11: qty 30, 30d supply, fill #0

## 2021-05-11 MED ORDER — CITALOPRAM HYDROBROMIDE 20 MG PO TABS
20.0000 mg | ORAL_TABLET | Freq: Every day | ORAL | 0 refills | Status: DC
  Filled 2021-05-11: qty 30, 30d supply, fill #0

## 2021-05-15 ENCOUNTER — Other Ambulatory Visit: Payer: Self-pay

## 2021-05-15 ENCOUNTER — Emergency Department: Payer: Self-pay

## 2021-05-15 ENCOUNTER — Emergency Department
Admission: EM | Admit: 2021-05-15 | Discharge: 2021-05-15 | Disposition: A | Discharge status: Home / Self Care | Payer: Self-pay | Attending: Emergency Medicine | Admitting: Emergency Medicine

## 2021-05-15 DIAGNOSIS — I10 Essential (primary) hypertension: Secondary | ICD-10-CM | POA: Insufficient documentation

## 2021-05-15 DIAGNOSIS — J101 Influenza due to other identified influenza virus with other respiratory manifestations: Secondary | ICD-10-CM | POA: Insufficient documentation

## 2021-05-15 DIAGNOSIS — Z87891 Personal history of nicotine dependence: Secondary | ICD-10-CM | POA: Insufficient documentation

## 2021-05-15 DIAGNOSIS — Z20822 Contact with and (suspected) exposure to covid-19: Secondary | ICD-10-CM | POA: Insufficient documentation

## 2021-05-15 DIAGNOSIS — E119 Type 2 diabetes mellitus without complications: Secondary | ICD-10-CM | POA: Insufficient documentation

## 2021-05-15 DIAGNOSIS — Z7984 Long term (current) use of oral hypoglycemic drugs: Secondary | ICD-10-CM | POA: Insufficient documentation

## 2021-05-15 LAB — CBC WITH DIFFERENTIAL/PLATELET
Abs Immature Granulocytes: 0.03 10*3/uL (ref 0.00–0.07)
Basophils Absolute: 0.1 10*3/uL (ref 0.0–0.1)
Basophils Relative: 1 %
Eosinophils Absolute: 0.1 10*3/uL (ref 0.0–0.5)
Eosinophils Relative: 2 %
HCT: 36.6 % (ref 36.0–46.0)
Hemoglobin: 11.7 g/dL — ABNORMAL LOW (ref 12.0–15.0)
Immature Granulocytes: 0 %
Lymphocytes Relative: 11 %
Lymphs Abs: 0.8 10*3/uL (ref 0.7–4.0)
MCH: 30.3 pg (ref 26.0–34.0)
MCHC: 32 g/dL (ref 30.0–36.0)
MCV: 94.8 fL (ref 80.0–100.0)
Monocytes Absolute: 0.6 10*3/uL (ref 0.1–1.0)
Monocytes Relative: 8 %
Neutro Abs: 5.3 10*3/uL (ref 1.7–7.7)
Neutrophils Relative %: 78 %
Platelets: 323 10*3/uL (ref 150–400)
RBC: 3.86 MIL/uL — ABNORMAL LOW (ref 3.87–5.11)
RDW: 14.5 % (ref 11.5–15.5)
WBC: 6.8 10*3/uL (ref 4.0–10.5)
nRBC: 0 % (ref 0.0–0.2)

## 2021-05-15 LAB — BASIC METABOLIC PANEL
Anion gap: 8 (ref 5–15)
BUN: 9 mg/dL (ref 6–20)
CO2: 23 mmol/L (ref 22–32)
Calcium: 8.9 mg/dL (ref 8.9–10.3)
Chloride: 104 mmol/L (ref 98–111)
Creatinine, Ser: 0.85 mg/dL (ref 0.44–1.00)
GFR, Estimated: >60 mL/min (ref >60)
Glucose, Bld: 121 mg/dL — ABNORMAL HIGH (ref 70–99)
Potassium: 3.5 mmol/L (ref 3.5–5.1)
Sodium: 135 mmol/L (ref 135–145)

## 2021-05-15 LAB — RESP PANEL BY RT-PCR (FLU A&B, COVID) ARPGX2
Influenza A by PCR: POSITIVE — AB
Influenza B by PCR: NEGATIVE
SARS Coronavirus 2 by RT PCR: NEGATIVE

## 2021-05-15 LAB — D-DIMER, QUANTITATIVE: D-Dimer, Quant: 0.3 ug/mL-FEU (ref 0.00–0.50)

## 2021-05-15 LAB — TROPONIN I (HIGH SENSITIVITY): Troponin I (High Sensitivity): <2 ng/L (ref <18)

## 2021-05-15 MED ORDER — LACTATED RINGERS IV BOLUS
1000.0000 mL | Freq: Once | INTRAVENOUS | Status: AC
  Administered 2021-05-15: 05:00:00 1000 mL via INTRAVENOUS

## 2021-05-15 MED ORDER — KETOROLAC TROMETHAMINE 30 MG/ML IJ SOLN: 15.0000 mg | Freq: Once | INTRAMUSCULAR | Status: DC

## 2021-05-15 MED ORDER — IBUPROFEN 800 MG PO TABS
800.0000 mg | ORAL_TABLET | Freq: Once | ORAL | Status: AC
  Administered 2021-05-15: 02:00:00 800 mg via ORAL
  Filled 2021-05-15: qty 1

## 2021-05-15 MED ORDER — ACETAMINOPHEN 500 MG PO TABS
1000.0000 mg | ORAL_TABLET | Freq: Once | ORAL | Status: AC
  Administered 2021-05-15: 05:00:00 1000 mg via ORAL
  Filled 2021-05-15: qty 2

## 2021-05-15 MED ORDER — ONDANSETRON HCL 4 MG PO TABS
4.0000 mg | ORAL_TABLET | Freq: Every day | ORAL | 1 refills | Status: DC | PRN
  Filled 2021-05-15: qty 30, 30d supply, fill #0

## 2021-05-15 NOTE — ED Triage Notes (Addendum)
Pt presents to ER c/o fever, body aches and cough since Friday.  Pt states she has run fever of "105" at home.  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  Pt A&O x4 at this time in NAD.  Pt states last took tylenol at 0100 tonight.
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
## 2021-05-16 ENCOUNTER — Other Ambulatory Visit: Payer: Self-pay

## 2021-05-24 ENCOUNTER — Other Ambulatory Visit: Payer: Self-pay

## 2021-05-24 ENCOUNTER — Ambulatory Visit: Payer: Self-pay | Admitting: Licensed Clinical Social Worker

## 2021-05-24 DIAGNOSIS — F411 Generalized anxiety disorder: Secondary | ICD-10-CM

## 2021-05-24 DIAGNOSIS — G4700 Insomnia, unspecified: Secondary | ICD-10-CM

## 2021-05-24 DIAGNOSIS — F330 Major depressive disorder, recurrent, mild: Secondary | ICD-10-CM

## 2021-05-24 NOTE — BH Specialist Note (Signed)
Integrated Behavioral Health Follow Up Telephone Visit  MRN: [id number] Name: Latoya Benson   Total time: 60 minutes  Types of Service: Telephone visit Patient consents to telephone visit and 2 patient identifiers were used to identify patient   Interpretor:No. Interpretor Name and Language: N/A  Subjective: Latoya Benson is a 25 y.o. female accompanied by  herself  Patient was referred by Carlyon Shadow, NP  for mental health. Patient reports the following symptoms/concerns: The patient reports that she has been doing well since hr last follow-up appointment. Anessia reported that she has been sick recently and went to the emergency department on 05/15/2021 and tested positive for the flu. Otis stated that she began to feel increasingly nauseous lately. She stated that she took an OTC pregnancy test at work today and the result was positive. The patient shared that she secretly stopped taking her birth control and did not tell anyone. She shared that she experience a late term miscarriage seven years ago. She explained that she has PCOS and has struggled to conceive. She stated that she is currently in disbelief that she is pregnant and she is still processing it. Latoya Benson noted that her mother has wanted her to have a child for a long time now; so for Christmas the patient is planning to surprise her mom with a big pregnancy reveal. The patient requested information on if her medications were considered safe for use during pregnancy. The patient discussed other health stressors that are impacting her life. Kalyssa denied any suicidal or homicidal thoughts.  Duration of problem: Years ; Severity of problem: moderate  Objective: Mood: Euthymic and Affect: Appropriate Risk of harm to self or others: No plan to harm self or others  Life Context: Family and Social: see above School/Work: see above Self-Care: see above Life Changes: see above  Patient and/or Family's  Strengths/Protective Factors: Social connections, Concrete supports in place (healthy food, safe environments, etc.), and Sense of purpose  Goals Addressed: Patient will:  Reduce symptoms of: agitation, anxiety, depression, insomnia, and stress   Increase knowledge and/or ability of: coping skills, healthy habits, self-management skills, and stress reduction   Demonstrate ability to: Increase healthy adjustment to current life circumstances and Increase adequate support systems for patient/family  Progress towards Goals: Ongoing  Interventions: Interventions utilized:  Supportive Counseling was utilized by the clinician during today's follow up session. Clinician met with patient to identify needs related to stressors and functioning, and assess and monitor for signs and symptoms of anxiety and depression, and assess safety. The clinician processed with the patient how they have been doing since the last follow-up session. Clinician intervened with positive regard and optimism to validate client's emotions, and supported client by messaging her primary care provider, Carlyon Shadow, NP with the patient's medication concerns. Clinician congratulated the patient on her exciting news and normalized her experience of disbelief and encouraged her to take the time she needed to process her emotions fully. Clinician provided the patient with the contact information for crisis services such as [facility], the local emergency department, [phone], and 911 should the patient require them over upcoming holidays when [facility] is closed. Session ended with scheduling.  Standardized Assessments completed: GAD-7 and PHQ 9 GAD-7 =  9 PHQ-9 =  7  Assessment: Patient currently experiencing see above.   Patient may benefit from see above.  Plan: Follow up with behavioral health clinician on : 06/15/20 at 6:00 pm  Behavioral recommendations:  Referral(s): [place] (  In Clinic) "From  scale of 1-10, how likely are you to follow plan?":   Lesli Albee, LCSWA

## 2021-05-26 ENCOUNTER — Other Ambulatory Visit: Payer: Self-pay

## 2021-06-06 ENCOUNTER — Other Ambulatory Visit: Payer: Self-pay | Admitting: Gerontology

## 2021-06-06 DIAGNOSIS — G4700 Insomnia, unspecified: Secondary | ICD-10-CM

## 2021-06-06 DIAGNOSIS — F330 Major depressive disorder, recurrent, mild: Secondary | ICD-10-CM

## 2021-06-07 ENCOUNTER — Other Ambulatory Visit: Payer: Self-pay

## 2021-06-07 ENCOUNTER — Encounter: Payer: Self-pay | Admitting: Obstetrics and Gynecology

## 2021-06-07 ENCOUNTER — Ambulatory Visit (INDEPENDENT_AMBULATORY_CARE_PROVIDER_SITE_OTHER): Payer: Self-pay | Admitting: Obstetrics and Gynecology

## 2021-06-07 VITALS — BP 138/87 | HR 101 | Resp 16 | Ht 62.0 in | Wt 224.3 lb

## 2021-06-07 DIAGNOSIS — Z3200 Encounter for pregnancy test, result unknown: Secondary | ICD-10-CM

## 2021-06-07 LAB — POCT URINE PREGNANCY: Preg Test, Ur: NEGATIVE
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
## 2021-06-08 ENCOUNTER — Other Ambulatory Visit: Payer: Self-pay

## 2021-06-08 LAB — BETA HCG QUANT (REF LAB): hCG Quant: <1 m[IU]/mL

## 2021-06-08 MED ORDER — TRAZODONE HCL 100 MG PO TABS
100.0000 mg | ORAL_TABLET | Freq: Every day | ORAL | 0 refills | Status: DC
  Filled 2021-06-08: qty 30, 30d supply, fill #0

## 2021-06-08 MED ORDER — CITALOPRAM HYDROBROMIDE 10 MG PO TABS
10.0000 mg | ORAL_TABLET | Freq: Every day | ORAL | 0 refills | Status: DC
  Filled 2021-06-08: qty 30, 30d supply, fill #0

## 2021-06-08 MED ORDER — CITALOPRAM HYDROBROMIDE 20 MG PO TABS
20.0000 mg | ORAL_TABLET | Freq: Every day | ORAL | 0 refills | Status: DC
  Filled 2021-06-08: qty 30, 30d supply, fill #0

## 2021-06-10 ENCOUNTER — Other Ambulatory Visit: Payer: Self-pay

## 2021-06-15 ENCOUNTER — Telehealth: Payer: Self-pay | Admitting: Licensed Clinical Social Worker

## 2021-06-15 ENCOUNTER — Ambulatory Visit: Payer: Self-pay | Admitting: Licensed Clinical Social Worker

## 2021-06-15 NOTE — Telephone Encounter (Signed)
Called the patient twice during today's scheduled appointment; no answer, left a voicemail with the clinic contact information so they may reschedule.   

## 2021-07-06 ENCOUNTER — Other Ambulatory Visit: Payer: Self-pay

## 2021-07-06 ENCOUNTER — Encounter: Payer: Self-pay | Admitting: Obstetrics and Gynecology

## 2021-07-06 ENCOUNTER — Other Ambulatory Visit: Payer: Self-pay | Admitting: Gerontology

## 2021-07-06 DIAGNOSIS — F330 Major depressive disorder, recurrent, mild: Secondary | ICD-10-CM

## 2021-07-06 DIAGNOSIS — G4700 Insomnia, unspecified: Secondary | ICD-10-CM

## 2021-07-06 DIAGNOSIS — M1A09X0 Idiopathic chronic gout, multiple sites, without tophus (tophi): Secondary | ICD-10-CM

## 2021-07-11 ENCOUNTER — Other Ambulatory Visit: Payer: Self-pay | Admitting: Gerontology

## 2021-07-11 ENCOUNTER — Other Ambulatory Visit: Payer: Self-pay

## 2021-07-11 DIAGNOSIS — G4700 Insomnia, unspecified: Secondary | ICD-10-CM

## 2021-07-11 DIAGNOSIS — M1A09X0 Idiopathic chronic gout, multiple sites, without tophus (tophi): Secondary | ICD-10-CM

## 2021-07-11 DIAGNOSIS — F330 Major depressive disorder, recurrent, mild: Secondary | ICD-10-CM

## 2021-07-12 ENCOUNTER — Other Ambulatory Visit: Payer: Self-pay

## 2021-07-12 MED FILL — Citalopram Hydrobromide Tab 10 MG (Base Equiv): ORAL | 30 days supply | Qty: 30 | Fill #0 | Status: AC

## 2021-07-12 MED FILL — Trazodone HCl Tab 100 MG: ORAL | 30 days supply | Qty: 30 | Fill #0 | Status: AC

## 2021-07-12 MED FILL — Allopurinol Tab 100 MG: ORAL | 20 days supply | Qty: 40 | Fill #0 | Status: AC

## 2021-07-12 MED FILL — Citalopram Hydrobromide Tab 20 MG (Base Equiv): ORAL | 30 days supply | Qty: 30 | Fill #0 | Status: AC

## 2021-07-13 ENCOUNTER — Ambulatory Visit: Payer: Self-pay | Admitting: Licensed Clinical Social Worker

## 2021-07-19 ENCOUNTER — Ambulatory Visit: Payer: Self-pay | Admitting: Licensed Clinical Social Worker

## 2021-07-19 ENCOUNTER — Other Ambulatory Visit: Payer: Self-pay

## 2021-07-19 DIAGNOSIS — F411 Generalized anxiety disorder: Secondary | ICD-10-CM

## 2021-07-19 DIAGNOSIS — F330 Major depressive disorder, recurrent, mild: Secondary | ICD-10-CM

## 2021-07-19 DIAGNOSIS — G4700 Insomnia, unspecified: Secondary | ICD-10-CM

## 2021-07-26 ENCOUNTER — Telehealth: Payer: Self-pay | Admitting: Physician Assistant

## 2021-07-26 ENCOUNTER — Ambulatory Visit: Payer: Self-pay | Admitting: Gerontology

## 2021-07-26 DIAGNOSIS — R051 Acute cough: Secondary | ICD-10-CM

## 2021-07-26 DIAGNOSIS — R112 Nausea with vomiting, unspecified: Secondary | ICD-10-CM

## 2021-07-26 NOTE — Progress Notes (Signed)
Based on what you shared with me, I feel your condition warrants further evaluation and I recommend that you be seen in a face to face visit.  I am concerned that you are having constant pain in your chest/abdomen and feel that you will require an in person evaluation so that you can have a physical exam and potentially labs and a chest xray.   NOTE: There will be NO CHARGE for this eVisit   If you are having a true medical emergency please call 911.      For an urgent face to face visit, [facility] has six urgent care centers for your convenience:     [facility] at [facility] Directions [phone] [address] [place], [place] [address]    [facility] Center [facility]) Get Driving Directions [phone] [address] [place], [place] [address]  [facility]gent [facility] Center [facility]) Get Driving Directions [phone] [address] [place],  [place]  [address]  [facility] Health Urgent Care at [facility] Get Driving Directions [phone] [address], [address] [place], [place] [address]   [facility] Health Urgent Care at [facility] Get Driving Directions  [phone] [address] [place], [place] [address]   [facility] Health Urgent Care at [facility] Directions [phone] [address] [place], [place] [address]  Your MyChart E-visit questionnaire answers were reviewed by a board certified advanced clinical practitioner to complete your personal care plan based on your specific symptoms.  Thank you for using e-Visits.   Approximately 5 minutes was spent documenting and reviewing patient's chart.

## 2021-07-27 ENCOUNTER — Emergency Department
Admission: EM | Admit: 2021-07-27 | Discharge: 2021-07-27 | Disposition: A | Discharge status: Home / Self Care | Payer: Self-pay | Attending: Emergency Medicine | Admitting: Emergency Medicine

## 2021-07-27 ENCOUNTER — Other Ambulatory Visit: Payer: Self-pay

## 2021-07-27 DIAGNOSIS — M791 Myalgia, unspecified site: Secondary | ICD-10-CM | POA: Insufficient documentation

## 2021-07-27 DIAGNOSIS — Z20822 Contact with and (suspected) exposure to covid-19: Secondary | ICD-10-CM | POA: Insufficient documentation

## 2021-07-27 DIAGNOSIS — R0981 Nasal congestion: Secondary | ICD-10-CM | POA: Insufficient documentation

## 2021-07-27 DIAGNOSIS — N764 Abscess of vulva: Secondary | ICD-10-CM | POA: Insufficient documentation

## 2021-07-27 DIAGNOSIS — U071 COVID-19: Secondary | ICD-10-CM

## 2021-07-27 DIAGNOSIS — R1110 Vomiting, unspecified: Secondary | ICD-10-CM | POA: Insufficient documentation

## 2021-07-27 DIAGNOSIS — R197 Diarrhea, unspecified: Secondary | ICD-10-CM | POA: Insufficient documentation

## 2021-07-27 DIAGNOSIS — E860 Dehydration: Secondary | ICD-10-CM | POA: Insufficient documentation

## 2021-07-27 DIAGNOSIS — R059 Cough, unspecified: Secondary | ICD-10-CM | POA: Insufficient documentation

## 2021-07-27 LAB — CBC WITH DIFFERENTIAL/PLATELET
Abs Immature Granulocytes: 0.02 10*3/uL (ref 0.00–0.07)
Basophils Absolute: 0.1 10*3/uL (ref 0.0–0.1)
Basophils Relative: 1 %
Eosinophils Absolute: 0 10*3/uL (ref 0.0–0.5)
Eosinophils Relative: 0 %
HCT: 39.1 % (ref 36.0–46.0)
Hemoglobin: 12 g/dL (ref 12.0–15.0)
Immature Granulocytes: 0 %
Lymphocytes Relative: 13 %
Lymphs Abs: 1.1 10*3/uL (ref 0.7–4.0)
MCH: 29.9 pg (ref 26.0–34.0)
MCHC: 30.7 g/dL (ref 30.0–36.0)
MCV: 97.3 fL (ref 80.0–100.0)
Monocytes Absolute: 0.5 10*3/uL (ref 0.1–1.0)
Monocytes Relative: 6 %
Neutro Abs: 6.7 10*3/uL (ref 1.7–7.7)
Neutrophils Relative %: 80 %
Platelets: 323 10*3/uL (ref 150–400)
RBC: 4.02 MIL/uL (ref 3.87–5.11)
RDW: 14.1 % (ref 11.5–15.5)
WBC: 8.4 10*3/uL (ref 4.0–10.5)
nRBC: 0 % (ref 0.0–0.2)

## 2021-07-27 LAB — URINALYSIS, ROUTINE W REFLEX MICROSCOPIC
Bilirubin Urine: NEGATIVE
Glucose, UA: NEGATIVE mg/dL
Hgb urine dipstick: NEGATIVE
Ketones, ur: NEGATIVE mg/dL
Leukocytes,Ua: NEGATIVE
Nitrite: NEGATIVE
Protein, ur: NEGATIVE mg/dL
Specific Gravity, Urine: 1.011 (ref 1.005–1.030)
pH: 5 (ref 5.0–8.0)

## 2021-07-27 LAB — COMPREHENSIVE METABOLIC PANEL
ALT: 20 U/L (ref 0–44)
AST: 20 U/L (ref 15–41)
Albumin: 3.8 g/dL (ref 3.5–5.0)
Alkaline Phosphatase: 39 U/L (ref 38–126)
Anion gap: 9 (ref 5–15)
BUN: 7 mg/dL (ref 6–20)
CO2: 22 mmol/L (ref 22–32)
Calcium: 8.9 mg/dL (ref 8.9–10.3)
Chloride: 105 mmol/L (ref 98–111)
Creatinine, Ser: 1.06 mg/dL — ABNORMAL HIGH (ref 0.44–1.00)
GFR, Estimated: >60 mL/min (ref >60)
Glucose, Bld: 105 mg/dL — ABNORMAL HIGH (ref 70–99)
Potassium: 3.5 mmol/L (ref 3.5–5.1)
Sodium: 136 mmol/L (ref 135–145)
Total Bilirubin: 0.3 mg/dL (ref 0.3–1.2)
Total Protein: 7.7 g/dL (ref 6.5–8.1)

## 2021-07-27 LAB — RESP PANEL BY RT-PCR (FLU A&B, COVID) ARPGX2
Influenza A by PCR: NEGATIVE
Influenza B by PCR: NEGATIVE
SARS Coronavirus 2 by RT PCR: NEGATIVE

## 2021-07-27 LAB — LACTIC ACID, PLASMA: Lactic Acid, Venous: 1.8 mmol/L (ref 0.5–1.9)

## 2021-07-27 MED ORDER — HYDROCODONE-ACETAMINOPHEN 5-325 MG PO TABS: 1.0000 | ORAL_TABLET | ORAL | 0 refills | Status: DC | PRN

## 2021-07-27 MED ORDER — SULFAMETHOXAZOLE-TRIMETHOPRIM 800-160 MG PO TABS: 1.0000 | ORAL_TABLET | Freq: Two times a day (BID) | ORAL | 0 refills | Status: DC

## 2021-07-27 MED ORDER — IBUPROFEN 600 MG PO TABS
600.0000 mg | ORAL_TABLET | Freq: Once | ORAL | Status: AC
  Administered 2021-07-27: 600 mg via ORAL
  Filled 2021-07-27: qty 1

## 2021-07-27 MED ORDER — ONDANSETRON 4 MG PO TBDP: 4.0000 mg | ORAL_TABLET | Freq: Three times a day (TID) | ORAL | 0 refills | Status: DC | PRN

## 2021-07-27 MED ORDER — OXYCODONE-ACETAMINOPHEN 5-325 MG PO TABS
1.0000 | ORAL_TABLET | Freq: Once | ORAL | Status: AC
  Administered 2021-07-27: 1 via ORAL
  Filled 2021-07-27: qty 1

## 2021-07-27 MED ORDER — SULFAMETHOXAZOLE-TRIMETHOPRIM 800-160 MG PO TABS
1.0000 | ORAL_TABLET | Freq: Once | ORAL | Status: AC
  Administered 2021-07-27: 1 via ORAL
  Filled 2021-07-27: qty 1

## 2021-07-27 MED ORDER — FENTANYL CITRATE PF 50 MCG/ML IJ SOSY
50.0000 ug | PREFILLED_SYRINGE | Freq: Once | INTRAMUSCULAR | Status: AC
  Administered 2021-07-27: 50 ug via INTRAVENOUS
  Filled 2021-07-27: qty 1

## 2021-07-27 MED ORDER — SODIUM CHLORIDE 0.9 % IV BOLUS
1000.0000 mL | Freq: Once | INTRAVENOUS | Status: AC
  Administered 2021-07-27: 1000 mL via INTRAVENOUS

## 2021-07-27 MED ORDER — LIDOCAINE-EPINEPHRINE (PF) 1 %-1:200000 IJ SOLN
10.0000 mL | Freq: Once | INTRAMUSCULAR | Status: AC
  Administered 2021-07-27: 10 mL
  Filled 2021-07-27: qty 10

## 2021-07-27 MED ORDER — LOPERAMIDE HCL 2 MG PO TABS: 2.0000 mg | ORAL_TABLET | Freq: Four times a day (QID) | ORAL | 0 refills | Status: DC | PRN

## 2021-07-27 NOTE — ED Notes (Signed)
Pt. Aware urine specimen is needed

## 2021-07-27 NOTE — ED Triage Notes (Addendum)
Pt to ED via POV from home. Pt reports an abscess inside vagina that has been present for a couple days. Pt denies drainage or foul odor. Pt reports fever at home with highest reading 105. Pt stating was seen last night at [facility] for dehydration and given fluids.

## 2021-07-27 NOTE — ED Provider Notes (Signed)
[facility] Provider Note  Patient Contact: 5:52 PM (approximate)   History   Abscess   HPI  Latoya Benson is a 26 y.o. female who presents emergency department complaining of a labial abscess.  Patient states that she had some viral symptoms with body aches, congestion, cough and dehydration.  She went the emergency department at [facility].  Diagnosed with viral illness and discharged.  Patient states that today she noted that she had a labial abscess.  It is not draining at this time.  She has had an abscess in the past but denies any chronic or recurrent/ongoing skin infections.  Patient states that the abscess is all external, no vaginal component.  She is having no vaginal discharge, bleeding.  No urinary changes.  Patient did arrive with a fever and a heart rate of 120 bpm.  On review of [facility] notes from yesterday, patient had been diagnosed with COVID slightly over a week ago, tested negative last night for COVID and flu.  Patient was given fluids and her labs are reassuring and she appears stable for discharge.  At that time she had no complaints of the labial abscess.  She was also endorsing vomiting and diarrhea last night.     Physical Exam   Triage Vital Signs: ED Triage Vitals  Enc Vitals Group     BP 07/27/21 1749 133/76     Pulse Rate 07/27/21 1749 (!) 120     Resp 07/27/21 1750 18     Temp 07/27/21 1749 (!) 100.4 F (38 C)     Temp Source 07/27/21 1749 Oral     SpO2 07/27/21 1750 96 %     Weight 07/27/21 1745 215 lb (97.5 kg)     Height 07/27/21 1745 5\' 2"  (1.575 m)     Head Circumference --      Peak Flow --      Pain Score 07/27/21 1744 10     Pain Loc --      Pain Edu? --      Excl. in [place]? --     Most recent vital signs: Vitals:   07/27/21 1750 07/27/21 2043  BP:  138/67  Pulse:  98  Resp: 18 18  Temp:    SpO2: 96% 98%     General: Alert and in no acute distress. ENT:      Ears:       Nose: No  congestion/rhinnorhea.      Mouth/Throat: Mucous membranes are moist. Neck: No stridor. No cervical spine tenderness to palpation. Hematological/Lymphatic/Immunilogical: No cervical lymphadenopathy. Cardiovascular:  Good peripheral perfusion Respiratory: Normal respiratory effort without tachypnea or retractions. Lungs CTAB. Good air entry to the bases with no decreased or absent breath sounds. Gastrointestinal: Bowel sounds 4 quadrants. Soft and nontender to palpation. No guarding or rigidity. No palpable masses. No distention. No CVA tenderness Genitourinary: Musculoskeletal: Full range of motion to all extremities.  Neurologic:  No gross focal neurologic deficits are appreciated.  Skin:   No rash noted Other:   ED Results / Procedures / Treatments   Labs (all labs ordered are listed, but only abnormal results are displayed) Labs Reviewed  COMPREHENSIVE METABOLIC PANEL - Abnormal; Notable for the following components:      Result Value   Glucose, Bld 105 (*)    Creatinine, Ser 1.06 (*)    All other components within normal limits  URINALYSIS, ROUTINE W REFLEX MICROSCOPIC - Abnormal; Notable for the following components:  Color, Urine YELLOW (*)    APPearance HAZY (*)    All other components within normal limits  RESP PANEL BY RT-PCR (FLU A&B, COVID) ARPGX2  CULTURE, BLOOD (ROUTINE X 2)  CBC WITH DIFFERENTIAL/PLATELET  LACTIC ACID, PLASMA  POC URINE PREG, ED     EKG     RADIOLOGY    No results found.  PROCEDURES:  Critical Care performed: No  ..Incision and Drainage  Date/Time: 07/27/2021 10:48 PM Performed by: Darletta Moll, PA-C Authorized by: Darletta Moll, PA-C   Consent:    Consent obtained:  Verbal   Consent given by:  Patient   Risks discussed:  Bleeding, incomplete drainage and pain Universal protocol:    Procedure explained and questions answered to patient or proxy's satisfaction: yes     Immediately prior to procedure, a time  out was called: yes     Patient identity confirmed:  Verbally with patient Location:    Type:  Abscess   Location:  Anogenital   Anogenital location: labia. Sedation:    Sedation type:  None Anesthesia:    Anesthesia method:  Local infiltration   Local anesthetic:  Lidocaine 1% WITH epi Procedure type:    Complexity:  Simple Procedure details:    Incision types:  Single straight   Wound management:  Probed and deloculated   Drainage:  Purulent   Drainage amount:  Moderate   Wound treatment:  Wound left open   Packing materials:  None Post-procedure details:    Procedure completion:  Tolerated well, no immediate complications   MEDICATIONS ORDERED IN ED: Medications  sodium chloride 0.9 % bolus 1,000 mL (0 mLs Intravenous Stopped 07/27/21 1953)  ibuprofen (ADVIL) tablet 600 mg (600 mg Oral Given 07/27/21 1840)  oxyCODONE-acetaminophen (PERCOCET/ROXICET) 5-325 MG per tablet 1 tablet (1 tablet Oral Given 07/27/21 1840)  lidocaine-EPINEPHrine (PF) (XYLOCAINE-EPINEPHrine) 1 %-1:200000 (PF) injection 10 mL (10 mLs Infiltration Given by Other 07/27/21 1953)  fentaNYL (SUBLIMAZE) injection 50 mcg (50 mcg Intravenous Given 07/27/21 1951)  sulfamethoxazole-trimethoprim (BACTRIM DS) 800-160 MG per tablet 1 tablet (1 tablet Oral Given 07/27/21 2014)  oxyCODONE-acetaminophen (PERCOCET/ROXICET) 5-325 MG per tablet 1 tablet (1 tablet Oral Given 07/27/21 2030)     IMPRESSION / MDM / ASSESSMENT AND PLAN / ED COURSE  I reviewed the triage vital signs and the nursing notes.                              Differential diagnosis includes, but is not limited to, sepsis, COVID, flu, Bartholin cyst, labial abscess   Patient's diagnosis is consistent with COVID-19 and labial abscess.  Patient has been diagnosed with COVID in the last week.  Still has symptoms of COVID which is likely the explanation for the patient's fever and tachycardia.  Patient developed incidentally what appears to be a labial abscess  secondary to shaving.  This was drained with purulent material expressed.  Patiently placed on antibiotics for same.  She will have other symptom control medications for her ongoing COVID symptoms.  This time labs are reassuring and I do not feel that patient requires admission.  Return precautions discussed with the patient at this time..  Patient is given ED precautions to return to the ED for any worsening or new symptoms.        FINAL CLINICAL IMPRESSION(S) / ED DIAGNOSES   Final diagnoses:  Labial abscess  COVID-19     Rx / DC Orders  ED Discharge Orders          Ordered    sulfamethoxazole-trimethoprim (BACTRIM DS) 800-160 MG tablet  2 times daily,   Status:  Discontinued        07/27/21 2012    sulfamethoxazole-trimethoprim (BACTRIM DS) 800-160 MG tablet  2 times daily        07/27/21 2017    ondansetron (ZOFRAN-ODT) 4 MG disintegrating tablet  Every 8 hours PRN        07/27/21 2017    loperamide (IMODIUM A-D) 2 MG tablet  4 times daily PRN        07/27/21 2017    HYDROcodone-acetaminophen (NORCO/VICODIN) 5-325 MG tablet  Every 4 hours PRN        07/27/21 2017    sulfamethoxazole-trimethoprim (BACTRIM DS) 800-160 MG tablet  2 times daily        07/27/21 2034             Note:  This document was prepared using Dragon voice recognition software and may include unintentional dictation errors.   Brynda Peon 07/27/21 2249    Vanessa Gray, MD 07/27/21 [phone]

## 2021-07-29 ENCOUNTER — Other Ambulatory Visit: Payer: Self-pay | Admitting: Gerontology

## 2021-07-29 ENCOUNTER — Other Ambulatory Visit: Payer: Self-pay | Admitting: Obstetrics and Gynecology

## 2021-07-29 DIAGNOSIS — F330 Major depressive disorder, recurrent, mild: Secondary | ICD-10-CM

## 2021-07-29 DIAGNOSIS — E282 Polycystic ovarian syndrome: Secondary | ICD-10-CM

## 2021-07-29 DIAGNOSIS — E8881 Metabolic syndrome: Secondary | ICD-10-CM

## 2021-07-29 DIAGNOSIS — G4700 Insomnia, unspecified: Secondary | ICD-10-CM

## 2021-07-29 MED FILL — Allopurinol Tab 100 MG: ORAL | 10 days supply | Qty: 20 | Fill #1 | Status: AC

## 2021-08-01 ENCOUNTER — Other Ambulatory Visit: Payer: Self-pay

## 2021-08-01 LAB — CULTURE, BLOOD (ROUTINE X 2)
Culture: NO GROWTH
Special Requests: ADEQUATE

## 2021-08-02 ENCOUNTER — Other Ambulatory Visit: Payer: Self-pay

## 2021-08-02 MED ORDER — CITALOPRAM HYDROBROMIDE 10 MG PO TABS
10.0000 mg | ORAL_TABLET | Freq: Every day | ORAL | 0 refills | Status: DC
  Filled 2021-08-02 – 2021-08-08 (×3): qty 30, 30d supply, fill #0

## 2021-08-02 MED ORDER — CITALOPRAM HYDROBROMIDE 20 MG PO TABS
20.0000 mg | ORAL_TABLET | Freq: Every day | ORAL | 0 refills | Status: DC
  Filled 2021-08-02 – 2021-08-08 (×3): qty 30, 30d supply, fill #0

## 2021-08-02 MED ORDER — TRAZODONE HCL 100 MG PO TABS
100.0000 mg | ORAL_TABLET | Freq: Every day | ORAL | 0 refills | Status: DC
  Filled 2021-08-02 – 2021-08-08 (×3): qty 30, 30d supply, fill #0

## 2021-08-02 MED ORDER — METFORMIN HCL 500 MG PO TABS
ORAL_TABLET | ORAL | 5 refills | Status: DC
  Filled 2021-08-02 – 2021-08-04 (×2): qty 60, fill #0
  Filled 2021-08-08: qty 60, 30d supply, fill #0
  Filled 2021-09-20: qty 60, 30d supply, fill #1
  Filled 2021-10-11: qty 60, 30d supply, fill #2
  Filled 2021-11-01: qty 60, 30d supply, fill #3
  Filled 2021-11-03: qty 60, 30d supply, fill #0
  Filled 2021-11-18 – 2021-11-23 (×4): qty 60, 30d supply, fill #1
  Filled 2021-12-13 – 2021-12-20 (×2): qty 60, 30d supply, fill #2

## 2021-08-03 ENCOUNTER — Other Ambulatory Visit: Payer: Self-pay

## 2021-08-04 ENCOUNTER — Other Ambulatory Visit: Payer: Self-pay

## 2021-08-04 ENCOUNTER — Ambulatory Visit: Payer: Self-pay | Admitting: Gerontology

## 2021-08-04 VITALS — BP 128/89 | HR 79 | Temp 98.0°F | Resp 16 | Ht 62.0 in | Wt 218.3 lb

## 2021-08-04 DIAGNOSIS — Z09 Encounter for follow-up examination after completed treatment for conditions other than malignant neoplasm: Secondary | ICD-10-CM

## 2021-08-04 DIAGNOSIS — M1A09X0 Idiopathic chronic gout, multiple sites, without tophus (tophi): Secondary | ICD-10-CM

## 2021-08-04 MED ORDER — ALLOPURINOL 100 MG PO TABS
ORAL_TABLET | Freq: Two times a day (BID) | ORAL | 5 refills | Status: DC
  Filled 2021-08-22: qty 42, 21d supply, fill #0
  Filled 2021-09-20: qty 60, 30d supply, fill #1
  Filled 2021-10-11: qty 60, 30d supply, fill #2
  Filled 2021-11-01: qty 60, 30d supply, fill #3
  Filled 2021-11-03: qty 60, 30d supply, fill #0
  Filled 2021-11-18 – 2021-11-23 (×4): qty 60, 30d supply, fill #1
  Filled 2021-12-13 – 2022-03-14 (×3): qty 60, 30d supply, fill #2
  Filled 2022-04-19: qty 18, 9d supply, fill #3
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
## 2021-08-05 ENCOUNTER — Other Ambulatory Visit: Payer: Self-pay

## 2021-08-08 ENCOUNTER — Other Ambulatory Visit: Payer: Self-pay

## 2021-08-15 ENCOUNTER — Ambulatory Visit (LOCAL_COMMUNITY_HEALTH_CENTER): Payer: Self-pay | Admitting: Family Medicine

## 2021-08-15 ENCOUNTER — Encounter: Payer: Self-pay | Admitting: Family Medicine

## 2021-08-15 ENCOUNTER — Other Ambulatory Visit: Payer: Self-pay

## 2021-08-15 VITALS — BP 116/88 | Ht 62.0 in | Wt 216.6 lb

## 2021-08-15 DIAGNOSIS — Z0000 Encounter for general adult medical examination without abnormal findings: Secondary | ICD-10-CM

## 2021-08-15 DIAGNOSIS — Z30011 Encounter for initial prescription of contraceptive pills: Secondary | ICD-10-CM

## 2021-08-15 MED ORDER — NORGESTIMATE-ETH ESTRADIOL 0.25-35 MG-MCG PO TABS: 1.0000 | ORAL_TABLET | Freq: Every day | ORAL | 12 refills | Status: DC

## 2021-08-15 NOTE — Progress Notes (Signed)
[facility] DEPARTMENT ?[facility] ?[address] [employer] ?Main Number: [phone] ? ? ? ?Family Planning Visit- Initial Visit ? ?Subjective:  ?Latoya Benson is a 26 y.o.  [id number]   being seen today for an initial annual visit and to discuss reproductive life planning.  The patient is currently using Oral Contraceptive for pregnancy prevention. Patient reports she/her/hers  want a pregnancy in the next year. But has PCOS and is wanting to take OCP while working on weight loss.  Patient has the following medical conditions has Sepsis (HCC); Pyelonephritis; Class 2 obesity with body mass index (BMI) of 38.0 to 38.9 in adult; ASCUS of cervix with negative high risk HPV; History of PCOS; Prediabetes; Myopia of both eyes; History of depression; Numbness and tingling in both hands; Insomnia; Vitamin D deficiency; Low vitamin B12 level; Thrombocytosis; Elevated alanine aminotransferase (ALT) level; Proteinuria; Diarrhea; and Follow-up exam on their problem list. ? ?Chief Complaint  ?Patient presents with  ? Annual Exam  ? Contraception  ? ? ?Patient reports here for physical and Pap ? ?Patient denies other concerns   ? ?Body mass index is 39.62 kg/m?. - Patient is eligible for diabetes screening based on BMI and age >30?  not applicable ?HA1C ordered? not applicable ? ?Patient reports 1  partner/s in last year. Desires STI screening?  No - declined  ? ?Has patient been screened once for HCV in the past?  No ? No results found for: HCVAB ? ?Does the patient have current drug use (including MJ), have a partner with drug use, and/or has been incarcerated since last result? No  ?If yes-- Screen for HCV through [facility] ?  ?Does the patient meet criteria for HBV testing? No ? ?Criteria:  ?-Household, sexual or needle sharing contact with HBV ?-History of drug use ?-HIV positive ?-Those with known Hep C ? ? ?Health Maintenance Due  ?Topic Date Due  ? COVID-19 Vaccine (1) Never done  ?  TETANUS/TDAP  Never done  ? ? ?Review of Systems  ?Constitutional:  Negative for chills, fever, malaise/fatigue and weight loss.  ?HENT:  Negative for congestion, hearing loss and sore throat.   ?Eyes:  Negative for blurred vision, double vision and photophobia.  ?Respiratory:  Negative for shortness of breath.   ?Cardiovascular:  Negative for chest pain.  ?Gastrointestinal:  Negative for abdominal pain, blood in stool, constipation, diarrhea, heartburn, nausea and vomiting.  ?Genitourinary:  Negative for dysuria and frequency.  ?Musculoskeletal:  Negative for back pain, joint pain and neck pain.  ?Skin:  Negative for itching and rash.  ?Neurological:  Negative for dizziness, weakness and headaches.  ?Endo/Heme/Allergies:  Does not bruise/bleed easily.  ?Psychiatric/Behavioral:  Negative for depression, substance abuse and suicidal ideas.   ? ?The following portions of the patient's history were reviewed and updated as appropriate: allergies, current medications, past family history, past medical history, past social history, past surgical history and problem list. Problem list updated. ? ? ?See flowsheet for other program required questions. ? ?Objective:  ? ?Vitals:  ? 08/15/21 1330  ?BP: 116/88  ?Weight: 216 lb 9.6 oz (98.2 kg)  ?Height: 5\' 2"  (1.575 m)  ? ? ?Physical Exam ?Vitals and nursing note reviewed.  ?Constitutional:   ?   Appearance: Normal appearance. She is obese.  ?HENT:  ?   Head: Normocephalic and atraumatic.  ?   Mouth/Throat:  ?   Mouth: Mucous membranes are moist.  ?   Pharynx: No oropharyngeal exudate or posterior  oropharyngeal erythema.  ?Eyes:  ?   General: No scleral icterus. ?Cardiovascular:  ?   Rate and Rhythm: Normal rate and regular rhythm.  ?   Pulses: Normal pulses.  ?   Heart sounds: Normal heart sounds.  ?Pulmonary:  ?   Effort: Pulmonary effort is normal.  ?   Breath sounds: Normal breath sounds.  ?Abdominal:  ?   General: Abdomen is flat. Bowel sounds are normal.  ?   Palpations:  Abdomen is soft.  ?Genitourinary: ?   Comments: deferred ?Musculoskeletal:     ?   General: Normal range of motion.  ?   Cervical back: Normal range of motion and neck supple.  ?Skin: ?   General: Skin is warm and dry.  ?Neurological:  ?   General: No focal deficit present.  ?   Mental Status: She is alert and oriented to person, place, and time.  ?Psychiatric:     ?   Mood and Affect: Mood normal.     ?   Behavior: Behavior normal.  ? ? ? ? ?Assessment and Plan:  ?Latoya Benson is a 26 y.o. female presenting to the [facility] Department for an initial annual wellness/contraceptive visit ? ?Contraception counseling: Reviewed all forms of birth control options in the tiered based approach. available including abstinence; over the counter/barrier methods; hormonal contraceptive medication including pill, patch, ring, injection,contraceptive implant, ECP; hormonal and nonhormonal IUDs; permanent sterilization options including vasectomy and the various tubal sterilization modalities. Risks, benefits, and typical effectiveness rates were reviewed.  Questions were answered.  Written information was also given to the patient to review.  Patient desires Oral Contraceptive, this was prescribed for patient.  ? ? ?The patient will follow up in as needed  for surveillance.  The patient was told to call with any further questions, or with any concerns about this method of contraception.  Emphasized use of condoms 100% of the time for STI prevention. ? ?Patient was not offered ECP based on last sex  ? ? ?1. Routine general medical examination at a health care facility ?Well woman exam  ?Pap due 2025  ?CBE due 2025 ?Discussed weight management and PCOS.  Pt is working on diet and exercise  ? ?2. Encounter for initial prescription of contraceptive pills ? ?- norgestimate-ethinyl estradiol (ORTHO-CYCLEN, 28,) 0.25-35 MG-MCG tablet; Take 1 tablet by mouth daily.  Dispense: 28 tablet; Refill: 12 ? ? ? ? ?No follow-ups  on file. ? ?Future Appointments  ?Date Time Provider Department Center  ?08/16/2021  5:00 PM Pruitt, Benard Halsted, LCSWA [facility] None  ?10/06/2021  6:00 PM [facility] None  ? ? ?Wendi Snipes, FNP ? ?

## 2021-08-15 NOTE — Progress Notes (Signed)
Pt here for PE and BC.   ?Pt was on Sprintec, but she has not taken any for a few weeks.  ?Consent form signed and Sprintec # 12 given.  Berdie Ogren, RN ? ?

## 2021-08-16 ENCOUNTER — Ambulatory Visit: Payer: Self-pay | Admitting: Licensed Clinical Social Worker

## 2021-08-19 ENCOUNTER — Other Ambulatory Visit: Payer: Self-pay

## 2021-08-22 ENCOUNTER — Other Ambulatory Visit: Payer: Self-pay

## 2021-08-23 ENCOUNTER — Ambulatory Visit: Payer: Self-pay | Admitting: Licensed Clinical Social Worker

## 2021-08-23 ENCOUNTER — Other Ambulatory Visit: Payer: Self-pay

## 2021-08-23 ENCOUNTER — Telehealth: Payer: Self-pay | Admitting: Licensed Clinical Social Worker

## 2021-08-23 DIAGNOSIS — F332 Major depressive disorder, recurrent severe without psychotic features: Secondary | ICD-10-CM

## 2021-08-23 DIAGNOSIS — G4700 Insomnia, unspecified: Secondary | ICD-10-CM

## 2021-08-23 DIAGNOSIS — F411 Generalized anxiety disorder: Secondary | ICD-10-CM

## 2021-08-23 NOTE — BH Specialist Note (Signed)
Integrated Behavioral Health via Telemedicine Visit ? ?08/23/2021 ?[name] ?[id number] ? ?Number of [facility] Clinician visits: No data recorded ?Session Start time: No data recorded  ?Session End time: No data recorded ?Total time in minutes: No data recorded ? ?Referring Provider: Carlyon Shadow, NP  ?Patient/Family location: The patient is at her place of work ?[facility] Provider location: [facility]  ?All persons participating in visit: Coralie Common and Jerrilyn Cairo, MSW, LCSW-A ?Types of Service: Telephone visit ? ?I connected with Coralie Common via  Telephone or Video Enabled Telemedicine Application  (Video is Caregility application) and verified that I am speaking with the correct person using two identifiers. Discussed confidentiality: Yes  ? ?I discussed the limitations of telemedicine and the availability of in person appointments. Discussed there is a possibility of technology failure and discussed alternative modes of communication if that failure occurs. ? ?Patient and/or legal guardian expressed understanding and consented to Telemedicine visit: Yes  ? ?Presenting Concerns: ?Patient and/or family reports the following symptoms/concerns: Latoya Benson reports that she has been doing worse since her last follow-up appointment. She noted that she takes trazodone 100 MG and citalopram 30 MG at 7:30 PM nightly and can not fall asleep until midnight. The patient explained that she drinks a lot of water and wakes up to use the bathroom at least three times per night. She stated that she wakes up at 7 AM to get ready for work, and by the afternoon she experiences significant fatigue and has difficulty concentrating. The patient noted that she is concerned that her psychotropic medications have stopped working. She shared that this change has been going on for about two weeks now. She explained that she has been feeling irritable and argues with her  spouse frequently. [name] shared that she has been very hard on herself lately and puts herself down a lot especially regarding her weight and appearance. She noted that she struggles with motivation and one day will feel highly motivated to exercise, make lifestyle changes, and work on her career then the next she day the motivation is gone. The patient endorsed suicidal ideation that occurred on several days last week. Teva denied having any plan to kill herself or access to means to carry out a plan. She noted that if her suicidal thoughts worsened she would tell her husband and go to the emergency department. Latoya Benson stated, " I do not want to die and I would not want to hurt my family." The patient denied any homicidal thoughts.  ?Duration of problem: Years; Severity of problem: moderate ? ?Patient and/or Family's Strengths/Protective Factors: ?Concrete supports in place (healthy food, safe environments, etc.) and Sense of purpose ? ?Goals Addressed: ?Patient will: ? Reduce symptoms of: agitation, anxiety, depression, insomnia, and stress  ? Increase knowledge and/or ability of: coping skills, healthy habits, self-management skills, and stress reduction  ? Demonstrate ability to: Increase healthy adjustment to current life circumstances ? ?Progress towards Goals: ?Ongoing ? ?Interventions: ?Interventions utilized:  CBT Cognitive Behavioral Therapy was utilized by the clinician during today's follow up session. Clinician met with patient to identify needs related to stressors and functioning, and assess and monitor for signs and symptoms of anxiety and depression, and assess safety. The clinician processed with the patient how they have been doing since the last follow-up session. Clinician measured the patient's depression and anxiety on a numerical scale. Clinician completed a risk assessment to determine level of severity when it comes to  patient's suicidal thoughts. Clinician determined that the  patient is competent, thinking clearly, orientated x 3, able to make own medical decisions, and is not acutely suicidal but if she were, she would access emergency crisis services. Clinician provided the patient with contact information for [facility] Crisis walk in, [facility]'s 24//7 crisis line, [phone], [phone], and encouraged her to utilize these crisis services or visit the nearest emergency department should her suicidal thoughts worsen. Clinician introduced distress tolerance skills to the patient and explained that negative emotions will usually lessen in intensity and pass over time. Clinician offered to take the patient's case for consultation with Dr. Octavia Heir, MD, Psychiatric Consultant and Carlyon Shadow, NP on Thursday 08/24/2021 at 10:00 AM; the patient agreed. The session ended with scheduling.  ?GAD-7= 18 ?PHQ-9= 19 ? ?Assessment: ?Patient currently experiencing see above.  ? ?Patient may benefit from see above. ? ?Plan: ?Follow up with behavioral health clinician on : 08/04/2021 at 6:00 PM  ?Behavioral recommendations:  ?Referral(s): [facility] (In Clinic) ? ?I discussed the assessment and treatment plan with the patient and/or parent/guardian. They were provided an opportunity to ask questions and all were answered. They agreed with the plan and demonstrated an understanding of the instructions. ?  ?They were advised to call back or seek an in-person evaluation if the symptoms worsen or if the condition fails to improve as anticipated. ? ?Lesli Albee, LCSWA ?

## 2021-08-23 NOTE — Telephone Encounter (Signed)
Called the patient during today's scheduled appointment; no answer, left a voicemail with the clinic contact information so they may reschedule.   

## 2021-08-24 ENCOUNTER — Ambulatory Visit: Payer: Self-pay | Admitting: Gerontology

## 2021-08-25 ENCOUNTER — Ambulatory Visit: Payer: Self-pay | Admitting: Gerontology

## 2021-09-01 ENCOUNTER — Other Ambulatory Visit: Payer: Self-pay

## 2021-09-01 ENCOUNTER — Encounter: Payer: Self-pay | Admitting: Gerontology

## 2021-09-01 ENCOUNTER — Ambulatory Visit: Payer: Self-pay | Admitting: Licensed Clinical Social Worker

## 2021-09-01 ENCOUNTER — Ambulatory Visit: Payer: Self-pay | Admitting: Gerontology

## 2021-09-01 VITALS — BP 121/74 | HR 76 | Temp 97.8°F | Resp 16 | Ht 62.0 in | Wt 220.0 lb

## 2021-09-01 DIAGNOSIS — R197 Diarrhea, unspecified: Secondary | ICD-10-CM

## 2021-09-01 DIAGNOSIS — F332 Major depressive disorder, recurrent severe without psychotic features: Secondary | ICD-10-CM

## 2021-09-01 DIAGNOSIS — E669 Obesity, unspecified: Secondary | ICD-10-CM

## 2021-09-01 MED ORDER — SERTRALINE HCL 100 MG PO TABS
100.0000 mg | ORAL_TABLET | Freq: Every day | ORAL | 0 refills | Status: DC
  Filled 2021-09-01 – 2021-09-20 (×2): qty 30, 30d supply, fill #0

## 2021-09-01 MED ORDER — SERTRALINE HCL 50 MG PO TABS
50.0000 mg | ORAL_TABLET | Freq: Every day | ORAL | 0 refills | Status: DC
  Filled 2021-09-01: qty 14, 14d supply, fill #0
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
## 2021-09-01 NOTE — Patient Instructions (Addendum)
Calorie Counting for Weight Loss ?Calories are units of energy. Your body needs a certain number of calories from food to keep going throughout the day. When you eat or drink more calories than your body needs, your body stores the extra calories mostly as fat. When you eat or drink fewer calories than your body needs, your body burns fat to get the energy it needs. ?Calorie counting means keeping track of how many calories you eat and drink each day. Calorie counting can be helpful if you need to lose weight. If you eat fewer calories than your body needs, you should lose weight. Ask your health care provider what a healthy weight is for you. ?For calorie counting to work, you will need to eat the right number of calories each day to lose a healthy amount of weight per week. A dietitian can help you figure out how many calories you need in a day and will suggest ways to reach your calorie goal. ?A healthy amount of weight to lose each week is usually 1-2 lb (0.5-0.9 kg). This usually means that your daily calorie intake should be reduced by 500-750 calories. ?Eating 1,200-1,500 calories a day can help most women lose weight. ?Eating 1,500-1,800 calories a day can help most men lose weight. ?What do I need to know about calorie counting? ?Work with your health care provider or dietitian to determine how many calories you should get each day. To meet your daily calorie goal, you will need to: ?Find out how many calories are in each food that you would like to eat. Try to do this before you eat. ?Decide how much of the food you plan to eat. ?Keep a food log. Do this by writing down what you ate and how many calories it had. ?To successfully lose weight, it is important to balance calorie counting with a healthy lifestyle that includes regular activity. ?Where do I find calorie information? ?The number of calories in a food can be found on a Nutrition Facts label. If a food does not have a Nutrition Facts label, try to  look up the calories online or ask your dietitian for help. ?Remember that calories are listed per serving. If you choose to have more than one serving of a food, you will have to multiply the calories per serving by the number of servings you plan to eat. For example, the label on a package of bread might say that a serving size is 1 slice and that there are 90 calories in a serving. If you eat 1 slice, you will have eaten 90 calories. If you eat 2 slices, you will have eaten 180 calories. ?How do I keep a food log? ?After each time that you eat, record the following in your food log as soon as possible: ?What you ate. Be sure to include toppings, sauces, and other extras on the food. ?How much you ate. This can be measured in cups, ounces, or number of items. ?How many calories were in each food and drink. ?The total number of calories in the food you ate. ?Keep your food log near you, such as in a pocket-sized notebook or on an app or website on your mobile phone. Some programs will calculate calories for you and show you how many calories you have left to meet your daily goal. ?What are some portion-control tips? ?Know how many calories are in a serving. This will help you know how many servings you can have of a certain food. ?  Use a measuring cup to measure serving sizes. You could also try weighing out portions on a kitchen scale. With time, you will be able to estimate serving sizes for some foods. ?Take time to put servings of different foods on your favorite plates or in your favorite bowls and cups so you know what a serving looks like. ?Try not to eat straight from a food's packaging, such as from a bag or box. Eating straight from the package makes it hard to see how much you are eating and can lead to overeating. Put the amount you would like to eat in a cup or on a plate to make sure you are eating the right portion. ?Use smaller plates, glasses, and bowls for smaller portions and to prevent  overeating. ?Try not to multitask. For example, avoid watching TV or using your computer while eating. If it is time to eat, sit down at a table and enjoy your food. This will help you recognize when you are full. It will also help you be more mindful of what and how much you are eating. ?What are tips for following this plan? ?Reading food labels ?Check the calorie count compared with the serving size. The serving size may be smaller than what you are used to eating. ?Check the source of the calories. Try to choose foods that are high in protein, fiber, and vitamins, and low in saturated fat, trans fat, and sodium. ?Shopping ?Read nutrition labels while you shop. This will help you make healthy decisions about which foods to buy. ?Pay attention to nutrition labels for low-fat or fat-free foods. These foods sometimes have the same number of calories or more calories than the full-fat versions. They also often have added sugar, starch, or salt to make up for flavor that was removed with the fat. ?Make a grocery list of lower-calorie foods and stick to it. ?Cooking ?Try to cook your favorite foods in a healthier way. For example, try baking instead of frying. ?Use low-fat dairy products. ?Meal planning ?Use more fruits and vegetables. One-half of your plate should be fruits and vegetables. ?Include lean proteins, such as chicken, turkey, and fish. ?Lifestyle ?Each week, aim to do one of the following: ?150 minutes of moderate exercise, such as walking. ?75 minutes of vigorous exercise, such as running. ?General information ?Know how many calories are in the foods you eat most often. This will help you calculate calorie counts faster. ?Find a way of tracking calories that works for you. Get creative. Try different apps or programs if writing down calories does not work for you. ?What foods should I eat? ? ?Eat nutritious foods. It is better to have a nutritious, high-calorie food, such as an avocado, than a food with  few nutrients, such as a bag of potato chips. ?Use your calories on foods and drinks that will fill you up and will not leave you hungry soon after eating. ?Examples of foods that fill you up are nuts and nut butters, vegetables, lean proteins, and high-fiber foods such as whole grains. High-fiber foods are foods with more than 5 g of fiber per serving. ?Pay attention to calories in drinks. Low-calorie drinks include water and unsweetened drinks. ?The items listed above may not be a complete list of foods and beverages you can eat. Contact a dietitian for more information. ?What foods should I limit? ?Limit foods or drinks that are not good sources of vitamins, minerals, or protein or that are high in unhealthy fats. These include: ?  Candy. ?Other sweets. ?Sodas, specialty coffee drinks, alcohol, and juice. ?The items listed above may not be a complete list of foods and beverages you should avoid. Contact a dietitian for more information. ?How do I count calories when eating out? ?Pay attention to portions. Often, portions are much larger when eating out. Try these tips to keep portions smaller: ?Consider sharing a meal instead of getting your own. ?If you get your own meal, eat only half of it. Before you start eating, ask for a container and put half of your meal into it. ?When available, consider ordering smaller portions from the menu instead of full portions. ?Pay attention to your food and drink choices. Knowing the way food is cooked and what is included with the meal can help you eat fewer calories. ?If calories are listed on the menu, choose the lower-calorie options. ?Choose dishes that include vegetables, fruits, whole grains, low-fat dairy products, and lean proteins. ?Choose items that are boiled, broiled, grilled, or steamed. Avoid items that are buttered, battered, fried, or served with cream sauce. Items labeled as crispy are usually fried, unless stated otherwise. ?Choose water, low-fat milk,  unsweetened iced tea, or other drinks without added sugar. If you want an alcoholic beverage, choose a lower-calorie option, such as a glass of wine or light beer. ?Ask for dressings, sauces, and syrups on the side. T

## 2021-09-07 ENCOUNTER — Ambulatory Visit: Payer: Self-pay | Admitting: Licensed Clinical Social Worker

## 2021-09-07 DIAGNOSIS — F411 Generalized anxiety disorder: Secondary | ICD-10-CM

## 2021-09-07 DIAGNOSIS — F339 Major depressive disorder, recurrent, unspecified: Secondary | ICD-10-CM

## 2021-09-07 NOTE — BH Specialist Note (Signed)
Integrated Behavioral Health via Telemedicine Visit ? ?09/07/2021 ?Latoya Benson ?[id number] ? ? ?Referring Provider: Carlyon Shadow, NP ?Patient/Family location: The patient's home ?[facility] Provider location: [facility] [place] ?All persons participating in visit: Coralie Common and Jerrilyn Cairo, LCSW-A ?Types of Service: Telephone visit ? ?I connected with Coralie Common via  Telephone or Video Enabled Telemedicine Application  (Video is Caregility application) and verified that I am speaking with the correct person using two identifiers. Discussed confidentiality: Yes  ? ?I discussed the limitations of telemedicine and the availability of in person appointments.  Discussed there is a possibility of technology failure and discussed alternative modes of communication if that failure occurs. ? ?Patient and/or legal guardian expressed understanding and consented to Telemedicine visit: Yes  ? ?Presenting Concerns: ?Patient and/or family reports the following symptoms/concerns: The patient states that she has been doing better since her last follow-up appointment. She noted that she started taking sertraline 50 MG daily and will increase to 100 MG daily next week. The patient noted that she is taking 10 MG citalopram and will stop taking that next week. The patient reports that she continues to take Trazodone 100 MG at bedtime. She noted that she has been doing well with the medication change and denied experiencing any side effects. Latoya Benson shared that she has noticed an increase of energy levels especially in the mornings. She noted that she feels more irritable in the evenings when she is tired. The patient discussed work and financial related stressors impacting her life. She noted that she is discussing moving again with her husband but they have not decided yet. The patient denied any panic attacks. She noted that she has been focusing on self care more this week. Latoya Benson denied any  suicidal or homicidal thoughts.  ?Duration of problem: Years ; Severity of problem: moderate ? ?Patient and/or Family's Strengths/Protective Factors: ?Concrete supports in place (healthy food, safe environments, etc.) ? ?Goals Addressed: ?Patient will: ? Reduce symptoms of: agitation, anxiety, depression, insomnia, and stress  ? Increase knowledge and/or ability of: coping skills, healthy habits, self-management skills, and stress reduction  ? Demonstrate ability to: Increase healthy adjustment to current life circumstances and Increase adequate support systems for patient/family ? ?Progress towards Goals: ?Ongoing ? ?Interventions: ?Interventions utilized:  CBT Cognitive Behavioral Therapywas utilized by the clinician during today's follow up session. Clinician met with patient to identify needs related to stressors and functioning, and assess and monitor for signs and symptoms of anxiety and depression, and assess safety. The clinician processed with the patient how they have been doing since the last follow-up session. Clinician measured the patient's anxiety and depression on a numerical scale. Clinician assisted the patient in developing positive, realistic, alternative thoughts that could  ?mediate confidence, self-assurance, and relaxation. The patient's compliance with psychotropic medications prescribed by her primary care provider was monitored, and the effectiveness of the medication on the patient's level of functioning was noted to improve.The clinician encouraged the patient to utilize their coping skills to deal with their current life circumstances. The session ended with scheduling.  ?Standardized Assessments completed: GAD-7 and PHQ 9 ?PHQ-9= 03 ?GAD-7= 13 ? ?Assessment: ?Patient currently experiencing see above.  ? ?Patient may benefit from see above. ? ?Plan: ?Follow up with behavioral health clinician on : 09/20/2021 at 11:00 AM  ?Behavioral recommendations:  ?Referral(s): [place] (In Clinic) ? ?I discussed the assessment and treatment plan with the patient and/or parent/guardian. They were provided an  opportunity to ask questions and all were answered. They agreed with the plan and demonstrated an understanding of the instructions. ?  ?They were advised to call back or seek an in-person evaluation if the symptoms worsen or if the condition fails to improve as anticipated. ? ?Lesli Albee, LCSWA ?

## 2021-09-13 ENCOUNTER — Other Ambulatory Visit: Payer: Self-pay | Admitting: Gerontology

## 2021-09-13 DIAGNOSIS — G4700 Insomnia, unspecified: Secondary | ICD-10-CM

## 2021-09-13 MED ORDER — TRAZODONE HCL 100 MG PO TABS
100.0000 mg | ORAL_TABLET | Freq: Every day | ORAL | 0 refills | Status: DC
  Filled 2021-09-20: qty 30, 30d supply, fill #0

## 2021-09-20 ENCOUNTER — Telehealth: Payer: Self-pay | Admitting: Licensed Clinical Social Worker

## 2021-09-20 ENCOUNTER — Other Ambulatory Visit: Payer: Self-pay

## 2021-09-20 ENCOUNTER — Ambulatory Visit: Payer: Self-pay | Admitting: Licensed Clinical Social Worker

## 2021-09-20 NOTE — Telephone Encounter (Signed)
Called the patient during today's scheduled appointment; no answer, left a voicemail with the clinic contact information so they may reschedule.   

## 2021-10-06 ENCOUNTER — Ambulatory Visit: Payer: Self-pay

## 2021-10-11 ENCOUNTER — Other Ambulatory Visit: Payer: Self-pay | Admitting: Gerontology

## 2021-10-11 DIAGNOSIS — F332 Major depressive disorder, recurrent severe without psychotic features: Secondary | ICD-10-CM

## 2021-10-11 DIAGNOSIS — G4700 Insomnia, unspecified: Secondary | ICD-10-CM

## 2021-10-12 ENCOUNTER — Other Ambulatory Visit: Payer: Self-pay

## 2021-10-12 ENCOUNTER — Telehealth: Payer: Self-pay | Admitting: Gerontology

## 2021-10-12 NOTE — Telephone Encounter (Signed)
-----   Message from Rolm Gala, NP sent at 10/12/2021 10:35 AM EDT ----- ?Pls schedule follow up appointment after 11/22/21 in clinic, and make telephone note. Thank you ? ?

## 2021-10-12 NOTE — Telephone Encounter (Signed)
Called and LVM

## 2021-10-14 ENCOUNTER — Other Ambulatory Visit: Payer: Self-pay

## 2021-10-19 ENCOUNTER — Other Ambulatory Visit: Payer: Self-pay

## 2021-10-19 MED ORDER — TRAZODONE HCL 100 MG PO TABS
100.0000 mg | ORAL_TABLET | Freq: Every day | ORAL | 0 refills | Status: DC
  Filled 2021-10-19: qty 20, 20d supply, fill #0
  Filled 2021-11-18: qty 10, 10d supply, fill #0

## 2021-10-19 MED ORDER — SERTRALINE HCL 100 MG PO TABS
100.0000 mg | ORAL_TABLET | Freq: Every day | ORAL | 0 refills | Status: DC
  Filled 2021-10-19: qty 30, 30d supply, fill #0

## 2021-10-20 ENCOUNTER — Other Ambulatory Visit: Payer: Self-pay

## 2021-10-25 ENCOUNTER — Ambulatory Visit: Payer: Self-pay | Admitting: Licensed Clinical Social Worker

## 2021-11-01 ENCOUNTER — Telehealth: Payer: Self-pay | Admitting: Licensed Clinical Social Worker

## 2021-11-01 ENCOUNTER — Ambulatory Visit: Payer: Self-pay | Admitting: Licensed Clinical Social Worker

## 2021-11-01 NOTE — Telephone Encounter (Signed)
Called the patient during today's scheduled appointment; no answer, left a voicemail with the clinic contact information so they may reschedule.   

## 2021-11-02 ENCOUNTER — Other Ambulatory Visit: Payer: Self-pay

## 2021-11-03 ENCOUNTER — Other Ambulatory Visit: Payer: Self-pay

## 2021-11-15 ENCOUNTER — Ambulatory Visit: Payer: Self-pay | Admitting: Licensed Clinical Social Worker

## 2021-11-15 DIAGNOSIS — F339 Major depressive disorder, recurrent, unspecified: Secondary | ICD-10-CM

## 2021-11-15 DIAGNOSIS — F411 Generalized anxiety disorder: Secondary | ICD-10-CM

## 2021-11-15 NOTE — BH Specialist Note (Unsigned)
Integrated Behavioral Health via Telemedicine Visit  11/15/2021 Latoya Benson [id number]   Referring Provider: Carlyon Shadow, NP Patient/Family location: The patient's address [facility] Provider location: [facility] All persons participating in visit: Taj Arteaga and Jerrilyn Cairo, LCSW-A Types of Service: Telephone visit  I connected with Coralie Common via  Telephone or Video Enabled Telemedicine Application  (Video is Caregility application) and verified that I am speaking with the correct person using two identifiers. Discussed confidentiality: Yes   I discussed the limitations of telemedicine and the availability of in person appointments.  Discussed there is a possibility of technology failure and discussed alternative modes of communication if that failure occurs.  Patient and/or legal guardian expressed understanding and consented to Telemedicine visit: Yes   Presenting Concerns: Patient and/or family reports the following symptoms/concerns: The patient reports that she has been doing the same since her last follow up appointment. The patient reports that she has been busy working. She noted that she enjoys her job and the type of work she is doing. The patient denied any recent panic attacks. She shared that she has been out of Trazodone for two days and requested the clinician message her provider for a refill. She stated that she continues to take sertraline daily. She noted that she only has difficulty falling asleep when she does not take her medication on time. She reported that she has been enjoying an improved overall mood. Ismerai discussed situational stressors impacting her life currently. She shared that she is using her coping skills such as deep breathing and going for walks to help her deal with things better. The patient denied any suicidal or homicidal thoughts,  Duration of problem: Years; Severity of problem: moderate  Patient and/or Family's  Strengths/Protective Factors: Social connections, Concrete supports in place (healthy food, safe environments, etc.), and Sense of purpose  Goals Addressed: Patient will:  Reduce symptoms of: agitation, anxiety, depression, insomnia, and stress   Increase knowledge and/or ability of: coping skills, healthy habits, self-management skills, and stress reduction   Demonstrate ability to: Increase healthy adjustment to current life circumstances  Progress towards Goals: Ongoing  Interventions: Interventions utilized:  CBT Cognitive Behavioral Therapy was utilized by the clinician during today's follow up session. Clinician met with patient to identify needs related to stressors and functioning, and assess and monitor for signs and symptoms of anxiety and depression, and assess safety. The clinician processed with the patient how they have been doing since the last follow-up session. Clinician measured the patients anxiety and depression on a numerical scale. Clinician messaged the patient's primary care provider, Carlyon Shadow, Np and informed her of the patient's medication refill requests. Clinician informed the patient that Medication Management has moved and changed their name and gave the patient the contact information so she could pick up her refills. The clinician encouraged the patient to continue to utilize their coping skills to deal with their current life circumstances. The session ended with scheduling.  Standardized Assessments completed: GAD-7 GAD-7= 13    Assessment: Patient currently experiencing see above.   Patient may benefit from see above.  Plan: Follow up with behavioral health clinician on : 11/23/2021 at 12:30 noon  Behavioral recommendations:  Referral(s): [place] (In Clinic)  I discussed the assessment and treatment plan with the patient and/or parent/guardian. They were provided an opportunity to ask questions and all were  answered. They agreed with the plan and demonstrated an understanding of the instructions.   They  were advised to call back or seek an in-person evaluation if the symptoms worsen or if the condition fails to improve as anticipated.  Lesli Albee, LCSWA

## 2021-11-16 ENCOUNTER — Other Ambulatory Visit: Payer: Self-pay

## 2021-11-18 ENCOUNTER — Other Ambulatory Visit: Payer: Self-pay | Admitting: Gerontology

## 2021-11-18 ENCOUNTER — Other Ambulatory Visit: Payer: Self-pay

## 2021-11-18 DIAGNOSIS — F332 Major depressive disorder, recurrent severe without psychotic features: Secondary | ICD-10-CM

## 2021-11-22 ENCOUNTER — Other Ambulatory Visit: Payer: Self-pay

## 2021-11-22 ENCOUNTER — Other Ambulatory Visit: Payer: Self-pay | Admitting: Gerontology

## 2021-11-22 ENCOUNTER — Ambulatory Visit: Payer: Self-pay | Admitting: Gastroenterology

## 2021-11-22 DIAGNOSIS — F332 Major depressive disorder, recurrent severe without psychotic features: Secondary | ICD-10-CM

## 2021-11-22 MED FILL — Sertraline HCl Tab 100 MG: ORAL | 30 days supply | Qty: 30 | Fill #0 | Status: AC

## 2021-11-23 ENCOUNTER — Ambulatory Visit: Payer: Self-pay | Admitting: Gerontology

## 2021-11-23 ENCOUNTER — Other Ambulatory Visit: Payer: Self-pay

## 2021-11-23 ENCOUNTER — Ambulatory Visit: Payer: Self-pay | Admitting: Licensed Clinical Social Worker

## 2021-11-23 ENCOUNTER — Encounter: Payer: Self-pay | Admitting: Gerontology

## 2021-11-23 VITALS — BP 117/83 | HR 83 | Temp 98.3°F | Resp 16 | Ht 62.0 in | Wt 206.4 lb

## 2021-11-23 DIAGNOSIS — M1A09X0 Idiopathic chronic gout, multiple sites, without tophus (tophi): Secondary | ICD-10-CM

## 2021-11-23 DIAGNOSIS — F411 Generalized anxiety disorder: Secondary | ICD-10-CM

## 2021-11-23 DIAGNOSIS — G4700 Insomnia, unspecified: Secondary | ICD-10-CM

## 2021-11-23 DIAGNOSIS — F332 Major depressive disorder, recurrent severe without psychotic features: Secondary | ICD-10-CM

## 2021-11-23 MED ORDER — ALLOPURINOL 100 MG PO TABS
ORAL_TABLET | Freq: Two times a day (BID) | ORAL | 2 refills | Status: DC
  Filled 2021-12-20: qty 60, 30d supply, fill #0
  Filled 2022-01-08: qty 60, 30d supply, fill #1
  Filled 2022-02-07: qty 60, 30d supply, fill #2

## 2021-11-23 MED ORDER — TRAZODONE HCL 100 MG PO TABS
100.0000 mg | ORAL_TABLET | Freq: Every day | ORAL | 1 refills | Status: DC
  Filled 2021-12-01: qty 30, 30d supply, fill #0
  Filled 2022-01-08: qty 30, 30d supply, fill #1

## 2021-11-23 MED ORDER — SERTRALINE HCL 100 MG PO TABS
100.0000 mg | ORAL_TABLET | Freq: Every day | ORAL | 1 refills | Status: DC
  Filled 2021-12-13 – 2021-12-20 (×2): qty 30, 30d supply, fill #0
  Filled 2022-01-08: qty 30, 30d supply, fill #1

## 2021-11-23 NOTE — BH Specialist Note (Signed)
Integrated Behavioral Health Follow Up In-Person Visit  MRN: [id number] Name: Latoya Benson  Types of Service: Individual psychotherapy  Interpretor:No. Interpretor Name and Language: N/A  Subjective: Latoya Benson is a 26 y.o. female accompanied by  herself Patient was referred by Carlyon Shadow, NP for Mental Health. Patient reports the following symptoms/concerns: The patient reports that she has been doing well since her last follow-up appointment. Safire noted that she is doing well in her job and enjoys working with children. She explained that her husband is no longer working out of town, and their relationship has significantly improved because she is communicating more and practising identifying when problems are smaller than they appear. She noted that she has realized that getting upset, having a verbal outburst, or throwing stuff when upset is not worth it to her and only hurts her more in the long run. She noted that she has been using her coping skills, such as urge surfing, to ride out difficult moments and then react after thinking about them calmly. The patient denied any sleep disturbances or appetite changes. She shared that she continues to take Trazodone 100 MG at bedtime and sertraline (Zoloft) 100 MG daily as prescribed; she denied any adverse reactions or episodes of mania. She shared that she has been doing very well overall and denied symptoms of depression or recent panic attacks. Kaylla denied any suicidal or homicidal thoughts.  Duration of problem: Years; Severity of problem: moderate  Objective: Mood: Euthymic and Affect: Appropriate Risk of harm to self or others: No plan to harm self or others  Life Context: Family and Social: see above School/Work: see above Self-Care: see above Life Changes: see above  Patient and/or Family's Strengths/Protective Factors: Social connections, Concrete supports in place (healthy food, safe environments, etc.),  Sense of purpose, and Caregiver has knowledge of parenting & child development  Goals Addressed: Patient will:  Reduce symptoms of: agitation, anxiety, depression, insomnia, and stress   Increase knowledge and/or ability of: coping skills, healthy habits, self-management skills, and stress reduction   Demonstrate ability to: Increase healthy adjustment to current life circumstances  Progress towards Goals: Ongoing  Interventions: Interventions utilized:  CBT Cognitive Behavioral Therapywas utilized by the clinician during today's follow up session. Clinician met with patient to identify needs related to stressors and functioning, and assess and monitor for signs and symptoms of anxiety and depression, and assess safety. The clinician processed with the patient how they have been doing since the last follow-up session. Clinician measured the patient's anxiety and  depression on a numerical scale.The clinician encouraged the patient to utilize their coping skills to deal with their current life circumstances and to continue to focus on incorporating self care into her daily routine. The session ended with scheduling. Standardized Assessments completed: AUDIT, GAD-7, and PHQ 2 Completed 11/15/2021 GAD-7= 13 Completed 11/23/2021 PHQ-2= 0 AUDIT= 1  Assessment: Patient currently experiencing see above.   Patient may benefit from see above.  Plan: Follow up with behavioral health clinician on : 12/21/2021 at 6:00 PM  Behavioral recommendations:  Referral(s): [place] (In Clinic) "From scale of 1-10, how likely are you to follow plan?":   Lesli Albee, LCSWA

## 2021-11-23 NOTE — Progress Notes (Signed)
Established Patient Office Visit  Subjective   Patient ID: Latoya Benson, female    DOB: May 09, 1996  Age: 27 y.o. MRN: [id number]  Chief Complaint  Patient presents with   Follow-up    HPI  Latoya Benson   is a 26 year old female who has a history of depression, hypertension, polycystic ovarian syndrome,presents for routine follow up  on weight loss.  She states that she is compliant with her medications, denies side effects and continues to make healthy lifestyle changes.  She states that her mood is good, denies suicidal or homicidal ideation and tolerates Zoloft and trazodone.  Overall, she states that she is doing well and offers no further complaint.  Review of Systems  Constitutional: Negative.   Respiratory: Negative.    Cardiovascular: Negative.   Neurological: Negative.   Psychiatric/Behavioral: Negative.        Objective:     BP 117/83 (BP Location: Left Arm, Patient Position: Sitting, Cuff Size: Large)   Pulse 83   Temp 98.3 F (36.8 C) (Oral)   Resp 16   Ht 5\' 2"  (1.575 m)   Wt 206 lb 6.4 oz (93.6 kg)   LMP 10/18/2021 (Exact Date)   SpO2 98%   BMI 37.75 kg/m  BP Readings from Last 3 Encounters:  11/23/21 117/83  09/01/21 121/74  08/15/21 116/88   Wt Readings from Last 3 Encounters:  11/23/21 206 lb 6.4 oz (93.6 kg)  09/01/21 220 lb 0.3 oz (99.8 kg)  08/15/21 216 lb 9.6 oz (98.2 kg)      Physical Exam HENT:     Head: Normocephalic and atraumatic.  Cardiovascular:     Rate and Rhythm: Normal rate and regular rhythm.     Pulses: Normal pulses.     Heart sounds: Normal heart sounds.  Pulmonary:     Effort: Pulmonary effort is normal.     Breath sounds: Normal breath sounds.  Neurological:     General: No focal deficit present.     Mental Status: She is alert and oriented to person, place, and time. Mental status is at baseline.  Psychiatric:        Mood and Affect: Mood normal.        Behavior: Behavior normal.        Thought Content:  Thought content normal.        Judgment: Judgment normal.      No results found for any visits on 11/23/21.  Last CBC Lab Results  Component Value Date   WBC 8.4 07/27/2021   HGB 12.0 07/27/2021   HCT 39.1 07/27/2021   MCV 97.3 07/27/2021   MCH 29.9 07/27/2021   RDW 14.1 07/27/2021   PLT 323 07/27/2021   Last metabolic panel Lab Results  Component Value Date   GLUCOSE 105 (H) 07/27/2021   NA 136 07/27/2021   K 3.5 07/27/2021   CL 105 07/27/2021   CO2 22 07/27/2021   BUN 7 07/27/2021   CREATININE 1.06 (H) 07/27/2021   GFRNONAA >60 07/27/2021   CALCIUM 8.9 07/27/2021   PROT 7.7 07/27/2021   ALBUMIN 3.8 07/27/2021   LABGLOB 3.0 03/24/2020   AGRATIO 1.5 03/24/2020   BILITOT 0.3 07/27/2021   ALKPHOS 39 07/27/2021   AST 20 07/27/2021   ALT 20 07/27/2021   ANIONGAP 9 07/27/2021   Last lipids Lab Results  Component Value Date   CHOL 186 03/24/2020   HDL 42 03/24/2020   LDLCALC 97 03/24/2020   TRIG 279 (H) 03/24/2020  CHOLHDL 4.4 03/24/2020   Last hemoglobin A1c Lab Results  Component Value Date   HGBA1C 5.8 (H) 07/22/2020   Last thyroid functions Lab Results  Component Value Date   TSH 1.870 07/22/2020      The ASCVD Risk score (Arnett DK, et al., 2019) failed to calculate for the following reasons:   The 2019 ASCVD risk score is only valid for ages 8 to 36    Assessment & Plan:   1. Chronic gout of multiple sites, unspecified cause -Her gout is under control and she will continue on current medication. - allopurinol (ZYLOPRIM) 100 MG tablet; TAKE ONE TABLET BY MOUTH 2 TIMES A DAY  Dispense: 60 tablet; Refill: 2  2. Severe episode of recurrent major depressive disorder, without psychotic features (HCC) -Her depression has improved with taking Zoloft and she will continue on current medication.  She was advised to call the crisis helpline with worsening symptoms. - sertraline (ZOLOFT) 100 MG tablet; Take 1 tablet (100 mg total) by mouth once daily.   Dispense: 30 tablet; Refill: 1  3. Insomnia, unspecified type -She is sleeping better, will continue on trazodone. - traZODone (DESYREL) 100 MG tablet; Take 1 tablet (100 mg total) by mouth once nightly at bedtime.  Dispense: 30 tablet; Refill: 1   Return in about 25 weeks (around 05/17/2022), or if symptoms worsen or fail to improve.    Latoya [place] Trellis Paganini, NP

## 2021-12-01 ENCOUNTER — Other Ambulatory Visit: Payer: Self-pay

## 2021-12-14 ENCOUNTER — Other Ambulatory Visit: Payer: Self-pay

## 2021-12-20 ENCOUNTER — Other Ambulatory Visit: Payer: Self-pay

## 2021-12-21 ENCOUNTER — Ambulatory Visit: Payer: Self-pay | Admitting: Licensed Clinical Social Worker

## 2022-01-08 ENCOUNTER — Other Ambulatory Visit: Payer: Self-pay

## 2022-01-08 ENCOUNTER — Other Ambulatory Visit: Payer: Self-pay | Admitting: Obstetrics and Gynecology

## 2022-01-08 DIAGNOSIS — E282 Polycystic ovarian syndrome: Secondary | ICD-10-CM

## 2022-01-08 DIAGNOSIS — E8881 Metabolic syndrome: Secondary | ICD-10-CM

## 2022-01-09 ENCOUNTER — Other Ambulatory Visit: Payer: Self-pay

## 2022-01-25 ENCOUNTER — Telehealth: Payer: Self-pay

## 2022-01-25 NOTE — Telephone Encounter (Signed)
Called pt left message to schedule appt for Latoya Benson.

## 2022-02-07 ENCOUNTER — Other Ambulatory Visit: Payer: Self-pay | Admitting: Obstetrics and Gynecology

## 2022-02-07 ENCOUNTER — Telehealth: Payer: Self-pay | Admitting: Gerontology

## 2022-02-07 ENCOUNTER — Other Ambulatory Visit: Payer: Self-pay | Admitting: Gerontology

## 2022-02-07 DIAGNOSIS — E8881 Metabolic syndrome: Secondary | ICD-10-CM

## 2022-02-07 DIAGNOSIS — E282 Polycystic ovarian syndrome: Secondary | ICD-10-CM

## 2022-02-07 DIAGNOSIS — G4700 Insomnia, unspecified: Secondary | ICD-10-CM

## 2022-02-07 DIAGNOSIS — F332 Major depressive disorder, recurrent severe without psychotic features: Secondary | ICD-10-CM

## 2022-02-07 NOTE — Telephone Encounter (Signed)
Called pt to schedule appt. No answer. Left vmail.

## 2022-02-08 ENCOUNTER — Ambulatory Visit: Payer: Self-pay | Admitting: Licensed Clinical Social Worker

## 2022-02-08 ENCOUNTER — Other Ambulatory Visit: Payer: Self-pay

## 2022-02-08 DIAGNOSIS — F411 Generalized anxiety disorder: Secondary | ICD-10-CM

## 2022-02-08 DIAGNOSIS — F330 Major depressive disorder, recurrent, mild: Secondary | ICD-10-CM

## 2022-02-08 DIAGNOSIS — G4700 Insomnia, unspecified: Secondary | ICD-10-CM

## 2022-02-08 MED ORDER — TRAZODONE HCL 100 MG PO TABS
100.0000 mg | ORAL_TABLET | Freq: Every day | ORAL | 0 refills | Status: DC
  Filled 2022-02-08: qty 30, 30d supply, fill #0

## 2022-02-08 MED ORDER — SERTRALINE HCL 100 MG PO TABS
100.0000 mg | ORAL_TABLET | Freq: Every day | ORAL | 0 refills | Status: DC
  Filled 2022-02-08: qty 30, 30d supply, fill #0

## 2022-02-08 NOTE — BH Specialist Note (Signed)
Integrated Behavioral Health via Telemedicine Visit  02/08/2022 Latoya Benson [id number]  Number of Integrated Behavioral Health Clinician visits: No data recorded Session Start time: No data recorded  Session End time: No data recorded Total time in minutes: No data recorded  Referring Provider: Elizabeth Chioma, NP Patient/Family location: The Patient's Home [facility] Provider location: [facility] All persons participating in visit: Cheri Ortlieb and Heather Pruitt, LCSW-A Types of Service: Telephone visit  I connected with Latoya Benson via  Telephone or Video Enabled Telemedicine Application  (Video is Caregility application) and verified that I am speaking with the correct person using two identifiers. Discussed confidentiality: Yes   I discussed the limitations of telemedicine and the availability of in person appointments.  Discussed there is a possibility of technology failure and discussed alternative modes of communication if that failure occurs.  I discussed that engaging in this telemedicine visit, they consent to the provision of behavioral healthcare and the services will be billed under their insurance.  Patient and/or legal guardian expressed understanding and consented to Telemedicine visit: Yes   Presenting Concerns: Patient and/or family reports the following symptoms/concerns: The patient reports that she has been doing well since her last follow-up appointment. 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 She noted that her spouse and she moved in with his parents recently to save money and recover financially. The patient discussed other situational stressors impacting her life currently. She denied recent panic attacks, sleep disturbances, or suicidal or homicidal thoughts. She said she takes 100 MG of sertraline daily and 100 mg of trazodone at bedtime. Latoya Benson shared that she is  satisfied with the medications and feels they are helping her; she denied any adverse effects from the medications she is taking. The patient noted that she is looking forward to a beach vacation next week with her spouse and will continue to practice using her coping skills to deal with stress in her life.   Duration of problem: Years; Severity of problem: moderate  Patient and/or Family's Strengths/Protective Factors: Social connections, Concrete supports in place (healthy food, safe environments, etc.), Sense of purpose, and Physical Health (exercise, healthy diet, medication compliance, etc.)  Goals Addressed: Patient will:  Reduce symptoms of: agitation, anxiety, depression, insomnia, and stress   Increase knowledge and/or ability of: coping skills, healthy habits, self-management skills, and stress reduction   Demonstrate ability to: Increase healthy adjustment to current life circumstances  Progress towards Goals: Ongoing  Interventions: Interventions utilized:  Medication Monitoring and Supportive Counseling was utilized by the clinician during today's follow up session. Clinician met with patient to identify needs related to stressors and functioning, and assess and monitor for signs and symptoms of anxiety and depression, and assess safety. The clinician processed with the patient how they have been doing since the last follow-up session. Clinician provided a safe space for the patient to vent her frustrations regarding her current life circumstances. The patient's compliance with psychotropic medications prescribed by her primary care provider was monitored, and the effectiveness of the medication on the patient's level of functioning was noted to improve. The session ended with scheduling.  Standardized Assessments completed: GAD-7 and PHQ 9 GAD-7= 8 PHQ-9= 6  Assessment: Patient currently experiencing see above.   Patient may benefit from see above.  Plan: Follow up with  behavioral health clinician on : 02/23/2022 at 10:00 AM  Behavioral recommendations:  Referral(s): Integrated Behavioral   Health Services (In Clinic)  I discussed the assessment and treatment plan with the patient and/or parent/guardian. They were provided an opportunity to ask questions and all were answered. They agreed with the plan and demonstrated an understanding of the instructions.   They were advised to call back or seek an in-person evaluation if the symptoms worsen or if the condition fails to improve as anticipated.  Lesli Albee, LCSWA

## 2022-02-10 ENCOUNTER — Other Ambulatory Visit: Payer: Self-pay

## 2022-02-10 ENCOUNTER — Other Ambulatory Visit: Payer: Self-pay | Admitting: Obstetrics and Gynecology

## 2022-02-10 DIAGNOSIS — E8881 Metabolic syndrome: Secondary | ICD-10-CM

## 2022-02-10 DIAGNOSIS — E282 Polycystic ovarian syndrome: Secondary | ICD-10-CM

## 2022-02-13 ENCOUNTER — Other Ambulatory Visit: Payer: Self-pay

## 2022-02-21 ENCOUNTER — Other Ambulatory Visit: Payer: Self-pay

## 2022-02-21 ENCOUNTER — Other Ambulatory Visit: Payer: Self-pay | Admitting: Gerontology

## 2022-02-21 DIAGNOSIS — G4700 Insomnia, unspecified: Secondary | ICD-10-CM

## 2022-02-21 DIAGNOSIS — F332 Major depressive disorder, recurrent severe without psychotic features: Secondary | ICD-10-CM

## 2022-02-21 MED ORDER — SERTRALINE HCL 100 MG PO TABS
100.0000 mg | ORAL_TABLET | Freq: Every day | ORAL | 0 refills | Status: DC
  Filled 2022-02-21 – 2022-04-19 (×2): qty 30, 30d supply, fill #0

## 2022-02-21 MED ORDER — TRAZODONE HCL 100 MG PO TABS
100.0000 mg | ORAL_TABLET | Freq: Every day | ORAL | 0 refills | Status: DC
  Filled 2022-02-21 – 2022-03-14 (×3): qty 30, 30d supply, fill #0

## 2022-02-23 ENCOUNTER — Ambulatory Visit: Payer: Self-pay | Admitting: Licensed Clinical Social Worker

## 2022-02-23 DIAGNOSIS — F411 Generalized anxiety disorder: Secondary | ICD-10-CM

## 2022-02-23 NOTE — BH Specialist Note (Signed)
Integrated Behavioral Health Follow Up Telephone Visit  MRN: [id number] Name: Latoya Benson   Types of Service: Telephone visit  Interpretor:No. Interpretor Name and Language: N/A  Subjective: Latoya Benson is a 26 y.o. female accompanied by  herself Patient was referred by Latoya Chioma, NP  for mental Health . Patient reports the following symptoms/concerns: The patient reports that she has been doing well since her last follow-up appointment.  She explained that she continues to live with her in-laws and enjoys spending time with her family.  She stated that she continues to do well at her job and that stressors in her life have been significantly reduced.  Latoya Benson shared that she continues implementing the coping skills she learned in therapy to deal with her current life circumstances.  Latoya Benson denied any recent panic attacks and noted that her current medications, trazodone and sertraline, help her sleep well and improve her ability to function.  The patient reported she would like to continue medication management but would prefer to discontinue weekly therapy at this time because she has learned the skills necessary to make progress on her own. The patient denied any suicidal or homicidal thoughts.Duration of problem: Years; Severity of problem: moderate  Objective: Mood: Euthymic and Affect: Appropriate Risk of harm to self or others: No plan to harm self or others  Life Context: Family and Social: see above School/Work: see above Self-Care: see above Life Changes: see above  Patient and/or Family's Strengths/Protective Factors: Social and Emotional competence, Concrete supports in place (healthy food, safe environments, etc.), Sense of purpose, Physical Health (exercise, healthy diet, medication compliance, etc.), and Caregiver has knowledge of parenting & child development  Goals Addressed: Patient will:  Reduce symptoms of: agitation, anxiety, depression,  insomnia, and stress   Increase knowledge and/or ability of: coping skills, healthy habits, self-management skills, and stress reduction   Demonstrate ability to: Increase healthy adjustment to current life circumstances  Progress towards Goals: Achieved  Interventions: Interventions utilized:  Supportive Counselingwas utilized by the clinician during today's follow up session. Clinician met with the patient to identify needs related to stressors and functioning, assess and monitor for signs and symptoms of anxiety and depression, and assess safety. The clinician processed with the patient how they had been doing since the last follow-up session. Clinician measured the patient's anxiety and depression on a numerical scale. The clinician discussed that it is the patient's right to terminate therapy; however, the therapist informed the patient that she could call at any time to schedule an appointment for an intake with [facility] at the [facility] and gave her the contact information for crisis resources should she so needs them for further support. In addition, the clinician discussed the process of [facility] and informed the patient that Latoya Benson, CMA, would reach out to her within a week to schedule medication management monthly follow-up appointments. Standardized Assessments completed: GAD-7 and PHQ 2 PHQ-2=0 GAD-7=2  Patient and/or Family Response: The patient stated that she continues to do well and would like to end therapy and begin medication management monthly.   Patient Centered Plan: Patient is on the following Treatment Plan(s): The patient will see Latoya Benson, CMA, monthly at the [facility] [place] for Psychotropic Medication Management. The patient may call to schedule a follow-up with Latoya Pruitt, LCSW-A, should she need further mental health support.  Assessment: Patient currently experiencing see above.   Patient may benefit  from see above.  Plan:   Follow up with behavioral health clinician on :   Behavioral recommendations:  Referral(s):  Latoya Benson CMA, [facility] "From scale of 1-10, how likely are you to follow plan?":   Latoya Benson, LCSWA

## 2022-03-14 ENCOUNTER — Other Ambulatory Visit: Payer: Self-pay

## 2022-03-14 ENCOUNTER — Other Ambulatory Visit: Payer: Self-pay | Admitting: Obstetrics and Gynecology

## 2022-03-14 DIAGNOSIS — E88819 Insulin resistance, unspecified: Secondary | ICD-10-CM

## 2022-03-14 DIAGNOSIS — E282 Polycystic ovarian syndrome: Secondary | ICD-10-CM

## 2022-03-16 ENCOUNTER — Ambulatory Visit: Payer: Self-pay | Admitting: Gerontology

## 2022-03-17 ENCOUNTER — Other Ambulatory Visit: Payer: Self-pay

## 2022-03-17 MED ORDER — METFORMIN HCL 500 MG PO TABS
ORAL_TABLET | ORAL | 5 refills | Status: DC
  Filled 2022-03-17 – 2022-04-19 (×2): qty 60, 30d supply, fill #0

## 2022-03-28 ENCOUNTER — Ambulatory Visit: Payer: Self-pay

## 2022-03-28 NOTE — Progress Notes (Unsigned)
Called pt to do medication management. PT answered and was alert. Pt taking trazadone 100mg  for sleep.Pt reports no sleep disturbances and is sleeping fine. Pt is not trying to get pregnant. PT takes zoloft 100 mg at night daily. Pt reports has been taking as prescribed and has no current stressors to report. Pt is doing good overall and made an appt for next month for med management and will call if any new concerns arise.  PHQ9-: 2 GAD-7: 0

## 2022-04-04 ENCOUNTER — Ambulatory Visit: Payer: Self-pay | Admitting: Gerontology

## 2022-04-05 ENCOUNTER — Ambulatory Visit: Payer: Self-pay | Admitting: Gerontology

## 2022-04-07 ENCOUNTER — Other Ambulatory Visit: Payer: Self-pay

## 2022-04-10 ENCOUNTER — Other Ambulatory Visit: Payer: Self-pay | Admitting: Gerontology

## 2022-04-10 DIAGNOSIS — G4700 Insomnia, unspecified: Secondary | ICD-10-CM

## 2022-04-11 ENCOUNTER — Other Ambulatory Visit: Payer: Self-pay | Admitting: Gerontology

## 2022-04-11 ENCOUNTER — Other Ambulatory Visit: Payer: Self-pay

## 2022-04-11 DIAGNOSIS — G4700 Insomnia, unspecified: Secondary | ICD-10-CM

## 2022-04-11 MED FILL — Trazodone HCl Tab 100 MG: ORAL | 30 days supply | Qty: 30 | Fill #0 | Status: AC

## 2022-04-19 ENCOUNTER — Ambulatory Visit: Payer: Self-pay | Admitting: Gerontology

## 2022-04-19 ENCOUNTER — Other Ambulatory Visit: Payer: Self-pay

## 2022-04-19 ENCOUNTER — Encounter: Payer: Self-pay | Admitting: Gerontology

## 2022-04-19 VITALS — BP 120/77 | HR 87 | Temp 98.4°F | Resp 16 | Ht 62.0 in | Wt 200.5 lb

## 2022-04-19 DIAGNOSIS — R7303 Prediabetes: Secondary | ICD-10-CM

## 2022-04-19 DIAGNOSIS — M1A09X0 Idiopathic chronic gout, multiple sites, without tophus (tophi): Secondary | ICD-10-CM

## 2022-04-19 DIAGNOSIS — R519 Headache, unspecified: Secondary | ICD-10-CM

## 2022-04-19 DIAGNOSIS — Z0000 Encounter for general adult medical examination without abnormal findings: Secondary | ICD-10-CM

## 2022-04-19 LAB — POCT GLYCOSYLATED HEMOGLOBIN (HGB A1C): Hemoglobin A1C: 5.4 % (ref 4.0–5.6)

## 2022-04-19 LAB — GLUCOSE, POCT (MANUAL RESULT ENTRY): POC Glucose: 144 mg/dl — AB (ref 70–99)

## 2022-04-19 MED ORDER — ALLOPURINOL 100 MG PO TABS
ORAL_TABLET | Freq: Two times a day (BID) | ORAL | 5 refills | Status: DC
  Filled 2022-05-19: qty 60, 30d supply, fill #0
  Filled 2022-06-11: qty 60, 30d supply, fill #1
  Filled 2022-07-05 (×2): qty 60, 30d supply, fill #2
  Filled 2022-08-09 – 2022-08-24 (×4): qty 60, 30d supply, fill #3
  Filled 2022-09-12: qty 60, 30d supply, fill #4
  Filled 2022-10-27: qty 60, 30d supply, fill #5

## 2022-04-19 NOTE — Progress Notes (Signed)
Established Patient Office Visit  Subjective   Patient ID: Latoya Benson, female    DOB: January 12, 1996  Age: 26 y.o. MRN: [id number]   Chief Complaint  Patient presents with   Follow-up    HPI  Latoya Benson  is a 26 year old female who has a history of depression, gout, polycystic ovarian syndrome who presents for routine follow-up. She self-stopped taking her metformin two months ago. Today in clinic, her HgbA1c is 5.4% and her POCT blood glucose is 144 mg/dL. She continues to make healthy lifestyle changes. She would like to check her Vitamin D and Vitamin B12 levels today in clinic. She endorses headaches over the past few months that occur every other day for about 2-3 hours in duration. The pain is located near her temples bilaterally. She cannot discern a pattern to her headaches. Laying down and taking tylenol provides relief. She does have a family history of migraines (mother, sister, and paternal grandmother). She states her depression is well-controlled, and she denies suicidal nor homicidal ideations. Overall, she is doing well and offers no further complaints.   Review of Systems  Constitutional: Negative.   HENT: Negative.    Eyes: Negative.   Respiratory: Negative.    Cardiovascular: Negative.   Gastrointestinal: Negative.   Genitourinary: Negative.   Musculoskeletal: Negative.   Skin: Negative.   Neurological:  Positive for headaches.  Endo/Heme/Allergies: Negative.   Psychiatric/Behavioral: Negative.        Objective:     Today's Vitals   04/19/22 0929  BP: 120/77  Pulse: 87  Resp: 16  Temp: 98.4 F (36.9 C)  TempSrc: Oral  SpO2: 99%  Weight: 200 lb 8 oz (90.9 kg)  Height: _0  (1.575 m)  PainSc: 0-No pain   Body mass index is 36.67 kg/m.  BP Readings from Last 3 Encounters:  04/19/22 120/77  11/23/21 117/83  09/01/21 121/74   Wt Readings from Last 3 Encounters:  04/19/22 200 lb 8 oz (90.9 kg)  11/23/21 206 lb 6.4 oz (93.6 kg)   09/01/21 220 lb 0.3 oz (99.8 kg)      Physical Exam Constitutional:      Appearance: Normal appearance. She is obese.  HENT:     Head: Normocephalic.  Cardiovascular:     Rate and Rhythm: Normal rate and regular rhythm.     Pulses: Normal pulses.     Heart sounds: Normal heart sounds.  Pulmonary:     Effort: Pulmonary effort is normal.     Breath sounds: Normal breath sounds.  Skin:    General: Skin is warm and dry.  Neurological:     General: No focal deficit present.     Mental Status: She is alert and oriented to person, place, and time. Mental status is at baseline.  Psychiatric:        Mood and Affect: Mood normal.        Behavior: Behavior normal.        Thought Content: Thought content normal.        Judgment: Judgment normal.      Results for orders placed or performed in visit on 04/19/22  POCT HgB A1C  Result Value Ref Range   Hemoglobin A1C 5.4 4.0 - 5.6 %   HbA1c POC (<> result, manual entry)     HbA1c, POC (prediabetic range)     HbA1c, POC (controlled diabetic range)    POCT Glucose (CBG)  Result Value Ref Range   POC Glucose 144 (A) 70 -  99 mg/dl    Last CBC Lab Results  Component Value Date   WBC 8.4 07/27/2021   HGB 12.0 07/27/2021   HCT 39.1 07/27/2021   MCV 97.3 07/27/2021   MCH 29.9 07/27/2021   RDW 14.1 07/27/2021   PLT 323 60/03/9322   Last metabolic panel Lab Results  Component Value Date   GLUCOSE 105 (H) 07/27/2021   NA 136 07/27/2021   K 3.5 07/27/2021   CL 105 07/27/2021   CO2 22 07/27/2021   BUN 7 07/27/2021   CREATININE 1.06 (H) 07/27/2021   GFRNONAA >60 07/27/2021   CALCIUM 8.9 07/27/2021   PROT 7.7 07/27/2021   ALBUMIN 3.8 07/27/2021   LABGLOB 3.0 03/24/2020   AGRATIO 1.5 03/24/2020   BILITOT 0.3 07/27/2021   ALKPHOS 39 07/27/2021   AST 20 07/27/2021   ALT 20 07/27/2021   ANIONGAP 9 07/27/2021   Last lipids Lab Results  Component Value Date   CHOL 186 03/24/2020   HDL 42 03/24/2020   LDLCALC 97 03/24/2020    TRIG 279 (H) 03/24/2020   CHOLHDL 4.4 03/24/2020   Last hemoglobin A1c Lab Results  Component Value Date   HGBA1C 5.4 04/19/2022      The ASCVD Risk score (Arnett DK, et al., 2019) failed to calculate for the following reasons:   The 2019 ASCVD risk score is only valid for ages 62 to 6    Assessment & Plan:   1. Prediabetes - Her HgbA1c is 5.4%. She no longer takes nor requires metformin. Continue to make healthy lifestyle changes. Low carb diet and exercise as tolerated.  - POCT HgB A1C - POCT Glucose (CBG)  2. Chronic gout of multiple sites, unspecified cause - Her gout is well-controlled on allopurinol. She should continue to stay hydrated.  - allopurinol (ZYLOPRIM) 100 MG tablet; TAKE ONE TABLET BY MOUTH 2 TIMES A DAY  Dispense: 60 tablet; Refill: 5  3. Health care maintenance - We will check her Vitamin D and B12 today. She continues to take over-the-counter Vitamin D and Vitamin B12. We will do other routine lab-work on 08/02/2022. - Vitamin D (25 hydroxy); Future - B12; Future - Lipid panel; Future - CBC w/Diff; Future - Comp Met (CMET); Future - Comp Met (CMET) - CBC w/Diff - Lipid panel - B12 - Vitamin D (25 hydroxy)  4. Nonintractable headache, unspecified chronicity pattern, unspecified headache type - She should continue to get adequate rest and stay hydrated, especially after exercising. She can take PRN Tylenol for her headaches. Call or return to clinic with worsening symptoms.    Follow-up in four months, around 08/09/2022   Latoya Char, FNP Student

## 2022-04-19 NOTE — Patient Instructions (Signed)
Migraine Headache  A migraine headache is a very strong throbbing pain on one side or both sides of your head. This type of headache can also cause other symptoms. It can last from 4 hours to 3 days. Talk with your doctor about what things may bring on (trigger) this condition.  What are the causes?  The exact cause of this condition is not known. This condition may be triggered or caused by:  Drinking alcohol.  Smoking.  Taking medicines, such as:  Medicine used to treat chest pain (nitroglycerin).  Birth control pills.  Estrogen.  Some blood pressure medicines.  Eating or drinking certain products.  Doing physical activity.  Other things that may trigger a migraine headache include:  Having a menstrual period.  Pregnancy.  Hunger.  Stress.  Not getting enough sleep or getting too much sleep.  Weather changes.  Tiredness (fatigue).  What increases the risk?  Being 25-55 years old.  Being female.  Having a family history of migraine headaches.  Being Caucasian.  Having depression or anxiety.  Being very overweight.  What are the signs or symptoms?  A throbbing pain. This pain may:  Happen in any area of the head, such as on one side or both sides.  Make it hard to do daily activities.  Get worse with physical activity.  Get worse around bright lights or loud noises.  Other symptoms may include:  Feeling sick to your stomach (nauseous).  Vomiting.  Dizziness.  Being sensitive to bright lights, loud noises, or smells.  Before you get a migraine headache, you may get warning signs (an aura). An aura may include:  Seeing flashing lights or having blind spots.  Seeing bright spots, halos, or zigzag lines.  Having tunnel vision or blurred vision.  Having numbness or a tingling feeling.  Having trouble talking.  Having weak muscles.  Some people have symptoms after a migraine headache (postdromal phase), such as:  Tiredness.  Trouble thinking (concentrating).  How is this treated?  Taking medicines that:  Relieve  pain.  Relieve the feeling of being sick to your stomach.  Prevent migraine headaches.  Treatment may also include:  Having acupuncture.  Avoiding foods that bring on migraine headaches.  Learning ways to control your body functions (biofeedback).  Therapy to help you know and deal with negative thoughts (cognitive behavioral therapy).  Follow these instructions at home:  Medicines  Take over-the-counter and prescription medicines only as told by your doctor.  Ask your doctor if the medicine prescribed to you:  Requires you to avoid driving or using heavy machinery.  Can cause trouble pooping (constipation). You may need to take these steps to prevent or treat trouble pooping:  Drink enough fluid to keep your pee (urine) pale yellow.  Take over-the-counter or prescription medicines.  Eat foods that are high in fiber. These include beans, whole grains, and fresh fruits and vegetables.  Limit foods that are high in fat and sugar. These include fried or sweet foods.  Lifestyle  Do not drink alcohol.  Do not use any products that contain nicotine or tobacco, such as cigarettes, e-cigarettes, and chewing tobacco. If you need help quitting, ask your doctor.  Get at least 8 hours of sleep every night.  Limit and deal with stress.  General instructions  Keep a journal to find out what may bring on your migraine headaches. For example, write down:  What you eat and drink.  How much sleep you get.  Any change in   what you eat or drink.  Any change in your medicines.  If you have a migraine headache:  Avoid things that make your symptoms worse, such as bright lights.  It may help to lie down in a dark, quiet room.  Do not drive or use heavy machinery.  Ask your doctor what activities are safe for you.  Keep all follow-up visits as told by your doctor. This is important.  Contact a doctor if:  You get a migraine headache that is different or worse than others you have had.  You have more than 15 headache days in one month.  Get  help right away if:  Your migraine headache gets very bad.  Your migraine headache lasts longer than 72 hours.  You have a fever.  You have a stiff neck.  You have trouble seeing.  Your muscles feel weak or like you cannot control them.  You start to lose your balance a lot.  You start to have trouble walking.  You pass out (faint).  You have a seizure.  Summary  A migraine headache is a very strong throbbing pain on one side or both sides of your head. These headaches can also cause other symptoms.  This condition may be treated with medicines and changes to your lifestyle.  Keep a journal to find out what may bring on your migraine headaches.  Contact a doctor if you get a migraine headache that is different or worse than others you have had.  Contact your doctor if you have more than 15 headache days in a month.  This information is not intended to replace advice given to you by your health care provider. Make sure you discuss any questions you have with your health care provider.  Document Revised: 11/10/2021 Document Reviewed: 07/11/2018  Elsevier Patient Education  2023 [employer] Inc[employer]

## 2022-04-20 LAB — CBC WITH DIFFERENTIAL/PLATELET
Basophils Absolute: 0.1 10*3/uL (ref 0.0–0.2)
Basos: 1 %
EOS (ABSOLUTE): 0.1 10*3/uL (ref 0.0–0.4)
Eos: 1 %
Hematocrit: 35.5 % (ref 34.0–46.6)
Hemoglobin: 12.1 g/dL (ref 11.1–15.9)
Immature Grans (Abs): 0.1 10*3/uL (ref 0.0–0.1)
Immature Granulocytes: 1 %
Lymphocytes Absolute: 1.9 10*3/uL (ref 0.7–3.1)
Lymphs: 25 %
MCH: 30 pg (ref 26.6–33.0)
MCHC: 34.1 g/dL (ref 31.5–35.7)
MCV: 88 fL (ref 79–97)
Monocytes Absolute: 0.3 10*3/uL (ref 0.1–0.9)
Monocytes: 3 %
Neutrophils Absolute: 5.4 10*3/uL (ref 1.4–7.0)
Neutrophils: 69 %
Platelets: 409 10*3/uL (ref 150–450)
RBC: 4.03 x10E6/uL (ref 3.77–5.28)
RDW: 12.4 % (ref 11.7–15.4)
WBC: 7.8 10*3/uL (ref 3.4–10.8)

## 2022-04-20 LAB — COMPREHENSIVE METABOLIC PANEL
ALT: 28 IU/L (ref 0–32)
AST: 8 IU/L (ref 0–40)
Albumin/Globulin Ratio: 1.7 (ref 1.2–2.2)
Albumin: 4.3 g/dL (ref 4.0–5.0)
Alkaline Phosphatase: 47 IU/L (ref 44–121)
BUN/Creatinine Ratio: 11 (ref 9–23)
BUN: 9 mg/dL (ref 6–20)
Bilirubin Total: 0.2 mg/dL (ref 0.0–1.2)
CO2: 21 mmol/L (ref 20–29)
Calcium: 10 mg/dL (ref 8.7–10.2)
Chloride: 103 mmol/L (ref 96–106)
Creatinine, Ser: 0.81 mg/dL (ref 0.57–1.00)
Globulin, Total: 2.5 g/dL (ref 1.5–4.5)
Glucose: 141 mg/dL — ABNORMAL HIGH (ref 70–99)
Potassium: 4 mmol/L (ref 3.5–5.2)
Sodium: 140 mmol/L (ref 134–144)
Total Protein: 6.8 g/dL (ref 6.0–8.5)
eGFR: 102 mL/min/{1.73_m2} (ref >59)

## 2022-04-20 LAB — LIPID PANEL
Chol/HDL Ratio: 4.2 ratio (ref 0.0–4.4)
Cholesterol, Total: 178 mg/dL (ref 100–199)
HDL: 42 mg/dL (ref >39)
LDL Chol Calc (NIH): 89 mg/dL (ref 0–99)
Triglycerides: 285 mg/dL — ABNORMAL HIGH (ref 0–149)
VLDL Cholesterol Cal: 47 mg/dL — ABNORMAL HIGH (ref 5–40)

## 2022-04-20 LAB — VITAMIN B12: Vitamin B-12: 982 pg/mL (ref 232–1245)

## 2022-04-20 LAB — VITAMIN D 25 HYDROXY (VIT D DEFICIENCY, FRACTURES): Vit D, 25-Hydroxy: 45 ng/mL (ref 30.0–100.0)

## 2022-05-02 ENCOUNTER — Ambulatory Visit: Payer: Self-pay

## 2022-05-12 ENCOUNTER — Other Ambulatory Visit: Payer: Self-pay | Admitting: Gerontology

## 2022-05-12 DIAGNOSIS — F332 Major depressive disorder, recurrent severe without psychotic features: Secondary | ICD-10-CM

## 2022-05-12 DIAGNOSIS — G4700 Insomnia, unspecified: Secondary | ICD-10-CM

## 2022-05-14 ENCOUNTER — Other Ambulatory Visit: Payer: Self-pay | Admitting: Gerontology

## 2022-05-14 ENCOUNTER — Other Ambulatory Visit: Payer: Self-pay

## 2022-05-14 DIAGNOSIS — G4700 Insomnia, unspecified: Secondary | ICD-10-CM

## 2022-05-14 DIAGNOSIS — F332 Major depressive disorder, recurrent severe without psychotic features: Secondary | ICD-10-CM

## 2022-05-15 ENCOUNTER — Other Ambulatory Visit: Payer: Self-pay

## 2022-05-16 ENCOUNTER — Ambulatory Visit: Payer: Self-pay

## 2022-05-17 ENCOUNTER — Other Ambulatory Visit: Payer: Self-pay

## 2022-05-17 ENCOUNTER — Telehealth: Payer: Self-pay | Admitting: Gerontology

## 2022-05-17 MED FILL — Sertraline HCl Tab 100 MG: ORAL | 30 days supply | Qty: 30 | Fill #0 | Status: AC

## 2022-05-17 MED FILL — Trazodone HCl Tab 100 MG: ORAL | 30 days supply | Qty: 30 | Fill #0 | Status: AC

## 2022-05-17 NOTE — Telephone Encounter (Signed)
Called pt to do a medication management call. Pt was alert and oriented. Pt stated she has no side effects from medicine and that she feels the medication is working. PT scored a 0 on PHQ9 and the Gats 7. Made appt for next month.

## 2022-05-17 NOTE — Telephone Encounter (Signed)
Called pt to complete med management call. No answer left msg. Pt needs refills as well.

## 2022-05-19 ENCOUNTER — Other Ambulatory Visit: Payer: Self-pay

## 2022-06-11 ENCOUNTER — Other Ambulatory Visit: Payer: Self-pay | Admitting: Gerontology

## 2022-06-11 ENCOUNTER — Other Ambulatory Visit: Payer: Self-pay

## 2022-06-11 DIAGNOSIS — G4700 Insomnia, unspecified: Secondary | ICD-10-CM

## 2022-06-11 DIAGNOSIS — F332 Major depressive disorder, recurrent severe without psychotic features: Secondary | ICD-10-CM

## 2022-06-12 ENCOUNTER — Other Ambulatory Visit: Payer: Self-pay

## 2022-06-13 ENCOUNTER — Other Ambulatory Visit: Payer: Self-pay

## 2022-06-13 MED ORDER — SERTRALINE HCL 100 MG PO TABS
100.0000 mg | ORAL_TABLET | Freq: Every day | ORAL | 0 refills | Status: DC
  Filled 2022-06-13: qty 30, 30d supply, fill #0

## 2022-06-13 MED ORDER — TRAZODONE HCL 100 MG PO TABS
100.0000 mg | ORAL_TABLET | Freq: Every day | ORAL | 0 refills | Status: DC
  Filled 2022-06-13: qty 30, 30d supply, fill #0

## 2022-06-20 ENCOUNTER — Ambulatory Visit: Payer: Self-pay

## 2022-06-21 ENCOUNTER — Telehealth: Payer: Self-pay | Admitting: Gerontology

## 2022-06-21 ENCOUNTER — Other Ambulatory Visit: Payer: Self-pay

## 2022-06-21 ENCOUNTER — Ambulatory Visit: Payer: Self-pay

## 2022-06-21 DIAGNOSIS — F332 Major depressive disorder, recurrent severe without psychotic features: Secondary | ICD-10-CM

## 2022-06-21 DIAGNOSIS — G4700 Insomnia, unspecified: Secondary | ICD-10-CM

## 2022-06-21 MED ORDER — SERTRALINE HCL 100 MG PO TABS
100.0000 mg | ORAL_TABLET | Freq: Every day | ORAL | 0 refills | Status: DC
  Filled 2022-06-21 – 2022-07-05 (×2): qty 30, 30d supply, fill #0

## 2022-06-21 MED ORDER — TRAZODONE HCL 100 MG PO TABS
100.0000 mg | ORAL_TABLET | Freq: Every day | ORAL | 0 refills | Status: DC
  Filled 2022-06-21 – 2022-07-05 (×2): qty 30, 30d supply, fill #0

## 2022-06-21 NOTE — Telephone Encounter (Signed)
Called pt for medication management visit. Pt stated she is doing good with no side effects and that the medicine she is currently on is working well. She scored a 0 on the PHQ9 and the Gats 7. Will send in another RX for pt and scheduled a new appt for next month. Pt verbalized understanding.

## 2022-07-05 ENCOUNTER — Other Ambulatory Visit: Payer: Self-pay

## 2022-07-06 ENCOUNTER — Other Ambulatory Visit: Payer: Self-pay

## 2022-07-06 ENCOUNTER — Telehealth: Payer: Self-pay | Admitting: Physician Assistant

## 2022-07-06 ENCOUNTER — Telehealth: Payer: Self-pay | Admitting: Urgent Care

## 2022-07-06 DIAGNOSIS — U071 COVID-19: Secondary | ICD-10-CM

## 2022-07-06 MED ORDER — NIRMATRELVIR/RITONAVIR (PAXLOVID)TABLET
3.0000 | ORAL_TABLET | Freq: Two times a day (BID) | ORAL | 0 refills | Status: AC
  Filled 2022-07-06: qty 30, 5d supply, fill #0

## 2022-07-06 MED ORDER — BENZONATATE 100 MG PO CAPS
100.0000 mg | ORAL_CAPSULE | Freq: Three times a day (TID) | ORAL | 0 refills | Status: DC | PRN
  Filled 2022-07-06: qty 30, 10d supply, fill #0

## 2022-07-06 NOTE — Progress Notes (Signed)
Virtual Visit Consent   Latoya Benson, you are scheduled for a virtual visit with a [facility] provider today. Just as with appointments in the office, your consent must be obtained to participate. Your consent will be active for this visit and any virtual visit you may have with one of our providers in the next 365 days. If you have a MyChart account, a copy of this consent can be sent to you electronically.  As this is a virtual visit, video technology does not allow for your provider to perform a traditional examination. This may limit your provider's ability to fully assess your condition. If your provider identifies any concerns that need to be evaluated in person or the need to arrange testing (such as labs, EKG, etc.), we will make arrangements to do so. Although advances in technology are sophisticated, we cannot ensure that it will always work on either your end or our end. If the connection with a video visit is poor, the visit may have to be switched to a telephone visit. With either a video or telephone visit, we are not always able to ensure that we have a secure connection.  By engaging in this virtual visit, you consent to the provision of healthcare and authorize for your insurance to be billed (if applicable) for the services provided during this visit. Depending on your insurance coverage, you may receive a charge related to this service.  I need to obtain your verbal consent now. Are you willing to proceed with your visit today? Latoya Benson has provided verbal consent on 07/06/2022 for a virtual visit (video or telephone). Latoya Benson, [place]  Date: 07/06/2022 3:18 PM  Virtual Visit via Video Note   I, Latoya Benson, connected with  PARTHENIA TELLEFSEN  ([id number], 1995/08/29) on 07/06/22 at  3:15 PM EST by a video-enabled telemedicine application and verified that I am speaking with the correct person using two identifiers.  Location: Patient: Virtual Visit  Location Patient: Home Provider: Virtual Visit Location Provider: Home Office   I discussed the limitations of evaluation and management by telemedicine and the availability of in person appointments. The patient expressed understanding and agreed to proceed.    History of Present Illness: Latoya Benson is a 27 y.o. who identifies as a female who was assigned female at birth, and is being seen today for COVID-19. Symptoms starting Sunday with fatigue and milder URI symptoms. Worsening Tuesday with addition of headaches, body aches and worsening congestion. Denies chest pain or SOB. Denies GI symptoms. Is taking Sudafed OTC. Took home COVID test that was positive.  HPI: HPI  Problems:  Patient Active Problem List   Diagnosis Date Noted   Headache 04/19/2022   Follow-up exam 08/04/2021   Elevated alanine aminotransferase (ALT) level 11/24/2020   Proteinuria 11/24/2020   Diarrhea 11/24/2020   Vitamin D deficiency 07/29/2020   Low vitamin B12 level 07/29/2020   Thrombocytosis 07/29/2020   Numbness and tingling in both hands 07/07/2020   Insomnia 07/07/2020   History of PCOS 05/05/2020   Prediabetes 05/05/2020   Myopia of both eyes 05/05/2020   History of depression 05/05/2020   ASCUS of cervix with negative high risk HPV 08/15/2019   Class 2 obesity with body mass index (BMI) of 38.0 to 38.9 in adult 08/07/2019   Pyelonephritis 07/31/2017   Sepsis ([place]) 04/02/2016   Need for varicella vaccine 07/27/2014   Obesity in pregnancy, antepartum 07/22/2014    Allergies: No Known Allergies Medications:  Current Outpatient Medications:    benzonatate (TESSALON) 100 MG capsule, Take 1 capsule (100 mg total) by mouth 3 (three) times daily as needed for cough., Disp: 30 capsule, Rfl: 0   nirmatrelvir/ritonavir (PAXLOVID) 20 x 150 MG & 10 x 100MG  TABS, Take 3 tablets by mouth 2 (two) times daily for 5 days. Take nirmatrelvir 150 mg two tablets twice daily for 5 days and ritonavir 100 mg one  tablet twice daily for 5 days) Patient GFR is 102, Disp: 30 tablet, Rfl: 0   allopurinol (ZYLOPRIM) 100 MG tablet, TAKE ONE TABLET BY MOUTH 2 TIMES A DAY, Disp: 60 tablet, Rfl: 5   Cholecalciferol (VITAMIN D3 PO), Take 1 tablet by mouth daily., Disp: , Rfl:    Cyanocobalamin (VITAMIN B12 PO), Take 1 tablet by mouth daily., Disp: , Rfl:    norgestimate-ethinyl estradiol (ORTHO-CYCLEN, 28,) 0.25-35 MG-MCG tablet, Take 1 tablet by mouth daily., Disp: 28 tablet, Rfl: 12   sertraline (ZOLOFT) 100 MG tablet, Take 1 tablet (100 mg total) by mouth once daily., Disp: 30 tablet, Rfl: 0   traZODone (DESYREL) 100 MG tablet, Take 1 tablet (100 mg total) by mouth once nightly at bedtime., Disp: 30 tablet, Rfl: 0  Observations/Objective: Patient is well-developed, well-nourished in no acute distress.  Resting comfortably  at home.  Head is normocephalic, atraumatic.  No labored breathing.  Speech is clear and coherent with logical content.  Patient is alert and oriented at baseline.   Assessment and Plan: 1. COVID-19 - nirmatrelvir/ritonavir (PAXLOVID) 20 x 150 MG & 10 x 100MG  TABS; Take 3 tablets by mouth 2 (two) times daily for 5 days. Take nirmatrelvir 150 mg two tablets twice daily for 5 days and ritonavir 100 mg one tablet twice daily for 5 days) Patient GFR is 102  Dispense: 30 tablet; Refill: 0 - benzonatate (TESSALON) 100 MG capsule; Take 1 capsule (100 mg total) by mouth 3 (three) times daily as needed for cough.  Dispense: 30 capsule; Refill: 0  . Discussed risks/benefits of antiviral medications including most common potential ADRs. Patient voiced understanding and would like to proceed with antiviral medication. They are candidate for Paxlovid with recent normal renal function. Rx sent to pharmacy. Supportive measures, OTC medications and vitamin regimen reviewed. Tessalon per orders.  Quarantine reviewed in detail. Strict ER precautions discussed with patient.    Follow Up Instructions: I  discussed the assessment and treatment plan with the patient. The patient was provided an opportunity to ask questions and all were answered. The patient agreed with the plan and demonstrated an understanding of the instructions.  A copy of instructions were sent to the patient via MyChart unless otherwise noted below.   The patient was advised to call back or seek an in-person evaluation if the symptoms worsen or if the condition fails to improve as anticipated.  Time:  I spent 10 minutes with the patient via telehealth technology discussing the above problems/concerns.    Latoya Rio, PA-C

## 2022-07-06 NOTE — Patient Instructions (Signed)
Latoya Benson, thank you for joining [name], PA-C for today's virtual visit.  While this provider is not your primary care provider (PCP), if your PCP is located in our provider database this encounter information will be shared with them immediately following your visit.   [facility] account gives you access to today's visit and all your visits, tests, and labs performed at [facility] " click here if you don't have a [facility] account or go to [email or web address]  Consent: (Patient) Latoya Benson provided verbal consent for this virtual visit at the beginning of the encounter.  Current Medications:  Current Outpatient Medications:    allopurinol (ZYLOPRIM) 100 MG tablet, TAKE ONE TABLET BY MOUTH 2 TIMES A DAY, Disp: 60 tablet, Rfl: 5   Cholecalciferol (VITAMIN D3 PO), Take 1 tablet by mouth daily., Disp: , Rfl:    Cyanocobalamin (VITAMIN B12 PO), Take 1 tablet by mouth daily., Disp: , Rfl:    norgestimate-ethinyl estradiol (ORTHO-CYCLEN, 28,) 0.25-35 MG-MCG tablet, Take 1 tablet by mouth daily., Disp: 28 tablet, Rfl: 12   sertraline (ZOLOFT) 100 MG tablet, Take 1 tablet (100 mg total) by mouth once daily., Disp: 30 tablet, Rfl: 0   traZODone (DESYREL) 100 MG tablet, Take 1 tablet (100 mg total) by mouth once nightly at bedtime., Disp: 30 tablet, Rfl: 0   Medications ordered in this encounter:  No orders of the defined types were placed in this encounter.    *If you need refills on other medications prior to your next appointment, please contact your pharmacy*  Follow-Up: Call back or seek an in-person evaluation if the symptoms worsen or if the condition fails to improve as anticipated.  [place] [phone]  Other Instructions Please keep well-hydrated and get plenty of rest. Start a saline nasal rinse to flush out your nasal passages. You can use plain Mucinex to help thin congestion. Use Tessalon per  orders.  If you have a humidifier, running in the bedroom at night. I want you to start OTC vitamin D3 1000 units daily, vitamin C 1000 mg daily, and a zinc supplement. Please take prescribed medications as directed.  You have been enrolled in a MyChart symptom monitoring program. Please answer these questions daily so we can keep track of how you are doing.  You were to quarantine for 5 days from onset of your symptoms.  After day 5, if you have had no fever and you are feeling better, you can end quarantine but need to mask for an additional 5 days. After day 5 if you have a fever or are having significant symptoms, please quarantine for full 10 days.  If you note any worsening of symptoms, any significant shortness of breath or any chest pain, please seek ER evaluation ASAP.  Please do not delay care!  COVID-19: What to Do if You Are Sick If you test positive and are an older adult or someone who is at high risk of getting very sick from COVID-19, treatment may be available. Contact a healthcare provider right away after a positive test to determine if you are eligible, even if your symptoms are mild right now. You can also visit a Test to Treat location and, if eligible, receive a prescription from a provider. Don't delay: Treatment must be started within the first few days to be effective. If you have a fever, cough, or other symptoms, you might have COVID-19. Most people have mild illness and are able  to recover at home. If you are sick: Keep track of your symptoms. If you have an emergency warning sign (including trouble breathing), call 911. Steps to help prevent the spread of COVID-19 if you are sick If you are sick with COVID-19 or think you might have COVID-19, follow the steps below to care for yourself and to help protect other people in your home and community. Stay home except to get medical care Stay home. Most people with COVID-19 have mild illness and can recover at home  without medical care. Do not leave your home, except to get medical care. Do not visit public areas and do not go to places where you are unable to wear a mask. Take care of yourself. Get rest and stay hydrated. Take over-the-counter medicines, such as acetaminophen, to help you feel better. Stay in touch with your doctor. Call before you get medical care. Be sure to get care if you have trouble breathing, or have any other emergency warning signs, or if you think it is an emergency. Avoid public transportation, ride-sharing, or taxis if possible. Get tested If you have symptoms of COVID-19, get tested. While waiting for test results, stay away from others, including staying apart from those living in your household. Get tested as soon as possible after your symptoms start. Treatments may be available for people with COVID-19 who are at risk for becoming very sick. Don't delay: Treatment must be started early to be effective--some treatments must begin within 5 days of your first symptoms. Contact your healthcare provider right away if your test result is positive to determine if you are eligible. Self-tests are one of several options for testing for the virus that causes COVID-19 and may be more convenient than laboratory-based tests and point-of-care tests. Ask your healthcare provider or your local health department if you need help interpreting your test results. You can visit your state, tribal, local, and territorial health department's website to look for the latest local information on testing sites. Separate yourself from other people As much as possible, stay in a specific room and away from other people and pets in your home. If possible, you should use a separate bathroom. If you need to be around other people or animals in or outside of the home, wear a well-fitting mask. Tell your close contacts that they may have been exposed to COVID-19. An infected person can spread COVID-19 starting 48  hours (or 2 days) before the person has any symptoms or tests positive. By letting your close contacts know they may have been exposed to COVID-19, you are helping to protect everyone. See COVID-19 and Animals if you have questions about pets. If you are diagnosed with COVID-19, someone from the health department may call you. Answer the call to slow the spread. Monitor your symptoms Symptoms of COVID-19 include fever, cough, or other symptoms. Follow care instructions from your healthcare provider and local health department. Your local health authorities may give instructions on checking your symptoms and reporting information. When to seek emergency medical attention Look for emergency warning signs* for COVID-19. If someone is showing any of these signs, seek emergency medical care immediately: Trouble breathing Persistent pain or pressure in the chest New confusion Inability to wake or stay awake Pale, gray, or blue-colored skin, lips, or nail beds, depending on skin tone *This list is not all possible symptoms. Please call your medical provider for any other symptoms that are severe or concerning to you. Call 911 or call ahead  to your local emergency facility: Notify the operator that you are seeking care for someone who has or may have COVID-19. Call ahead before visiting your doctor Call ahead. Many medical visits for routine care are being postponed or done by phone or telemedicine. If you have a medical appointment that cannot be postponed, call your doctor's office, and tell them you have or may have COVID-19. This will help the office protect themselves and other patients. If you are sick, wear a well-fitting mask You should wear a mask if you must be around other people or animals, including pets (even at home). Wear a mask with the best fit, protection, and comfort for you. You don't need to wear the mask if you are alone. If you can't put on a mask (because of trouble breathing,  for example), cover your coughs and sneezes in some other way. Try to stay at least 6 feet away from other people. This will help protect the people around you. Masks should not be placed on young children under age 27 years, anyone who has trouble breathing, or anyone who is not able to remove the mask without help. Cover your coughs and sneezes Cover your mouth and nose with a tissue when you cough or sneeze. Throw away used tissues in a lined trash can. Immediately wash your hands with soap and water for at least 20 seconds. If soap and water are not available, clean your hands with an alcohol-based hand sanitizer that contains at least 60% alcohol. Clean your hands often Wash your hands often with soap and water for at least 20 seconds. This is especially important after blowing your nose, coughing, or sneezing; going to the bathroom; and before eating or preparing food. Use hand sanitizer if soap and water are not available. Use an alcohol-based hand sanitizer with at least 60% alcohol, covering all surfaces of your hands and rubbing them together until they feel dry. Soap and water are the best option, especially if hands are visibly dirty. Avoid touching your eyes, nose, and mouth with unwashed hands. Handwashing Tips Avoid sharing personal household items Do not share dishes, drinking glasses, cups, eating utensils, towels, or bedding with other people in your home. Wash these items thoroughly after using them with soap and water or put in the dishwasher. Clean surfaces in your home regularly Clean and disinfect high-touch surfaces (for example, doorknobs, tables, handles, light switches, and countertops) in your "sick room" and bathroom. In shared spaces, you should clean and disinfect surfaces and items after each use by the person who is ill. If you are sick and cannot clean, a caregiver or other person should only clean and disinfect the area around you (such as your bedroom and bathroom)  on an as needed basis. Your caregiver/other person should wait as long as possible (at least several hours) and wear a mask before entering, cleaning, and disinfecting shared spaces that you use. Clean and disinfect areas that may have blood, stool, or body fluids on them. Use household cleaners and disinfectants. Clean visible dirty surfaces with household cleaners containing soap or detergent. Then, use a household disinfectant. Use a product from [employer] List N: Disinfectants for Coronavirus (COVID-19). Be sure to follow the instructions on the label to ensure safe and effective use of the product. Many products recommend keeping the surface wet with a disinfectant for a certain period of time (look at "contact time" on the product label). You may also need to wear personal protective equipment, such as  gloves, depending on the directions on the product label. Immediately after disinfecting, wash your hands with soap and water for 20 seconds. For completed guidance on cleaning and disinfecting your home, visit Complete Disinfection Guidance. Take steps to improve ventilation at home Improve ventilation (air flow) at home to help prevent from spreading COVID-19 to other people in your household. Clear out COVID-19 virus particles in the air by opening windows, using air filters, and turning on fans in your home. Use this interactive tool to learn how to improve air flow in your home. When you can be around others after being sick with COVID-19 Deciding when you can be around others is different for different situations. Find out when you can safely end home isolation. For any additional questions about your care, contact your healthcare provider or state or local health department. 08/31/2020 Content source: [facility] for Immunization and Respiratory Diseases ([facility]), Division of Viral Diseases This information is not intended to replace advice given to you by your health care provider. Make  sure you discuss any questions you have with your health care provider. Document Revised: 10/14/2020 Document Reviewed: 10/14/2020 Elsevier Patient Education  2022 [employer].      If you have been instructed to have an in-person evaluation today at a local Urgent Care facility, please use the link below. It will take you to a list of all of our available [facility], including address, phone number and hours of operation. Please do not delay care.  [facility]  If you or a family member do not have a primary care provider, use the link below to schedule a visit and establish care. When you choose a [facility] primary care physician or advanced practice provider, you gain a long-term partner in health. Find a Primary Care Provider  Learn more about [facility]'s in-office and virtual care options: [facility] - Get Care Now

## 2022-07-06 NOTE — Progress Notes (Signed)
   Thank you for the details you included in the comment boxes. Those details are very helpful in determining the best course of treatment for you and help [place] to provide the best care. I'm sorry you are dealing with Covid-19. Because you are outside of the treatment window with the covid antivirals, and based upon the severity of your pulmonary symptoms, we recommend that you convert this visit to a video visit in order for the provider to better assess what is going on.  The provider will be able to give you a more accurate diagnosis and treatment plan if we can more freely discuss your symptoms and with the addition of a virtual examination.   If you convert to a video visit, we will bill your insurance (similar to an office visit) and you will not be charged for this e-Visit. You will be able to stay at home and speak with the first available [facility] advanced practice provider. The link to do a video visit is in the drop down Menu tab of your Welcome screen in [place].

## 2022-07-07 ENCOUNTER — Other Ambulatory Visit: Payer: Self-pay

## 2022-08-01 ENCOUNTER — Other Ambulatory Visit: Payer: Self-pay | Admitting: Gerontology

## 2022-08-01 DIAGNOSIS — G4700 Insomnia, unspecified: Secondary | ICD-10-CM

## 2022-08-01 DIAGNOSIS — F332 Major depressive disorder, recurrent severe without psychotic features: Secondary | ICD-10-CM

## 2022-08-02 ENCOUNTER — Ambulatory Visit: Payer: Self-pay

## 2022-08-02 ENCOUNTER — Telehealth: Payer: Self-pay

## 2022-08-02 NOTE — Telephone Encounter (Signed)
Called pt for our medication management visit. Pt stated she is more irritable and moody and would like to see about increasing her medicaion dosage. Pt scored a 17 on the PHQ9 and a 18 on the GAT7. I am getting with Heather to discuss dose change and will call pt back. Made pt appt for next visit. Pt verbalized understanding.

## 2022-08-03 ENCOUNTER — Other Ambulatory Visit: Payer: Self-pay

## 2022-08-07 ENCOUNTER — Ambulatory Visit: Payer: Self-pay | Admitting: Licensed Clinical Social Worker

## 2022-08-09 ENCOUNTER — Other Ambulatory Visit: Payer: Self-pay

## 2022-08-09 VITALS — BP 109/63 | HR 87 | Temp 98.1°F | Ht 62.0 in | Wt 211.9 lb

## 2022-08-09 DIAGNOSIS — Z0000 Encounter for general adult medical examination without abnormal findings: Secondary | ICD-10-CM

## 2022-08-10 LAB — COMPREHENSIVE METABOLIC PANEL
ALT: 14 IU/L (ref 0–32)
AST: 12 IU/L (ref 0–40)
Albumin/Globulin Ratio: 1.6 (ref 1.2–2.2)
Albumin: 4.1 g/dL (ref 4.0–5.0)
Alkaline Phosphatase: 56 IU/L (ref 44–121)
BUN/Creatinine Ratio: 9 (ref 9–23)
BUN: 7 mg/dL (ref 6–20)
Bilirubin Total: 0.2 mg/dL (ref 0.0–1.2)
CO2: 18 mmol/L — ABNORMAL LOW (ref 20–29)
Calcium: 9.7 mg/dL (ref 8.7–10.2)
Chloride: 106 mmol/L (ref 96–106)
Creatinine, Ser: 0.78 mg/dL (ref 0.57–1.00)
Globulin, Total: 2.6 g/dL (ref 1.5–4.5)
Glucose: 81 mg/dL (ref 70–99)
Potassium: 4.4 mmol/L (ref 3.5–5.2)
Sodium: 142 mmol/L (ref 134–144)
Total Protein: 6.7 g/dL (ref 6.0–8.5)
eGFR: 106 mL/min/{1.73_m2} (ref >59)

## 2022-08-10 LAB — CBC WITH DIFFERENTIAL/PLATELET
Basophils Absolute: 0.1 10*3/uL (ref 0.0–0.2)
Basos: 1 %
EOS (ABSOLUTE): 0.1 10*3/uL (ref 0.0–0.4)
Eos: 2 %
Hematocrit: 38 % (ref 34.0–46.6)
Hemoglobin: 12.3 g/dL (ref 11.1–15.9)
Immature Grans (Abs): 0 10*3/uL (ref 0.0–0.1)
Immature Granulocytes: 0 %
Lymphocytes Absolute: 2.3 10*3/uL (ref 0.7–3.1)
Lymphs: 31 %
MCH: 29.6 pg (ref 26.6–33.0)
MCHC: 32.4 g/dL (ref 31.5–35.7)
MCV: 92 fL (ref 79–97)
Monocytes Absolute: 0.4 10*3/uL (ref 0.1–0.9)
Monocytes: 5 %
Neutrophils Absolute: 4.6 10*3/uL (ref 1.4–7.0)
Neutrophils: 61 %
Platelets: 464 10*3/uL — ABNORMAL HIGH (ref 150–450)
RBC: 4.15 x10E6/uL (ref 3.77–5.28)
RDW: 13.4 % (ref 11.7–15.4)
WBC: 7.4 10*3/uL (ref 3.4–10.8)

## 2022-08-10 LAB — HEMOGLOBIN A1C
Est. average glucose Bld gHb Est-mCnc: 114 mg/dL
Hgb A1c MFr Bld: 5.6 % (ref 4.8–5.6)

## 2022-08-10 LAB — LIPID PANEL
Chol/HDL Ratio: 5.7 ratio — ABNORMAL HIGH (ref 0.0–4.4)
Cholesterol, Total: 194 mg/dL (ref 100–199)
HDL: 34 mg/dL — ABNORMAL LOW (ref >39)
LDL Chol Calc (NIH): 95 mg/dL (ref 0–99)
Triglycerides: 392 mg/dL — ABNORMAL HIGH (ref 0–149)
VLDL Cholesterol Cal: 65 mg/dL — ABNORMAL HIGH (ref 5–40)

## 2022-08-10 LAB — TSH: TSH: 1.76 u[IU]/mL (ref 0.450–4.500)

## 2022-08-14 ENCOUNTER — Ambulatory Visit: Payer: Self-pay | Admitting: Licensed Clinical Social Worker

## 2022-08-14 DIAGNOSIS — F411 Generalized anxiety disorder: Secondary | ICD-10-CM

## 2022-08-14 DIAGNOSIS — F339 Major depressive disorder, recurrent, unspecified: Secondary | ICD-10-CM

## 2022-08-14 NOTE — BH Specialist Note (Unsigned)
Integrated Behavioral Health via Telemedicine Visit  08/14/2022 [name] CD:[id number]  Number of [facility] Clinician visits: No data recorded Session Start time: No data recorded  Session End time: No data recorded Total time in minutes: No data recorded  Referring Provider: Carlyon Shadow, NP  Patient/Family location: The Patient's Work  [facility] Provider location: Remote [place], [place]  All persons participating in visit: Latoya Benson and Latoya Benson, LCSWA Types of Service: Telephone visit  I connected with Latoya Benson via  Telephone or Video Enabled Telemedicine Application  (Video is Caregility application) and verified that I am speaking with the correct person using two identifiers. Discussed confidentiality: Yes   I discussed the limitations of telemedicine and the availability of in person appointments.  Discussed there is a possibility of technology failure and discussed alternative modes of communication if that failure occurs.  I discussed that engaging in this telemedicine visit, they consent to the provision of behavioral healthcare and the services will be billed under their insurance.  Patient and/or legal guardian expressed understanding and consented to Telemedicine visit: Yes   Presenting Concerns: Patient and/or family reports the following symptoms/concerns: The patient noted that she has been doing worse since her last follow-up. She shared that her husband and her moved in with his parents a couple of months ago and now share a home with nine adults and several children. She discussed having a lack of privacy or space and feeling stuck in her current living situation due to her husbands legal issues and financial barriers. Dziyah noted that she has been experiencing increasing difficulty falling asleep often taking in excess of two hours each night. She stated that in the afternoons her energy levels crash and she experiences  fatigue daily. She reports that with the increased stress she has started to binge eat at night to the point she feels unwell. She described feeling depressed and down daily and having thoughts that she can't continue and would be better off dead. Sammye denied any plan to end her life or access to means to carry out a plan. 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 She stated she believes that is when her depression symptoms worsened. Rielynn noted that she continues to take trazodone 100 MG at bedtime and sertraline 100 MG daily. She stated that she does not believe her medications are working anymore and feels like she did before she started taking them. Terianne denied any thoughts of harming others. She requested the clinician speak to her providers about adjusting her medications.  Duration of problem: Years ; Severity of problem: moderate  Patient and/or Family's Strengths/Protective Factors: Concrete supports in place (healthy food, safe environments, etc.), Sense of purpose, and Physical Health (exercise, healthy diet, medication compliance, etc.)  Goals Addressed: Patient will:  Reduce symptoms of: agitation, anxiety, depression, insomnia, and stress   Increase knowledge and/or ability of: coping skills, healthy habits, self-management skills, and stress reduction   Demonstrate ability to: Increase healthy adjustment to current life circumstances  Progress towards Goals: Ongoing  Interventions/Plan: Interventions utilized:  CBT Cognitive Behavioral Therapy was utilized by the clinician during today's follow up session. Clinician met with patient to identify needs related to stressors and functioning, and assess and monitor for signs and symptoms of anxiety and depression, and assess safety. The clinician processed with the patient how they have been doing since the last follow-up session. Clinician completed a risk assessment to determine  level of severity when it comes to patient's suicidal thoughts.The Clinician determined that the patient is competent, thinking clearly, orientated x 4, able to make medical decisions, and is not acutely suicidal. Still, if she experienced worsening symptoms, the Clinician informed her on how to access emergency crisis services. Clinician offered to restart weekly therapy sessions and offered a later appointment time to accommodate the patient's work schedule. Clinician explained that she would take the patient's case for psychiatric consultation on Tuesday March 5,2024 with Dr. Royston Bake, MD psychiatric consultant and Carlyon Shadow, NP and call her to go over any recommendation.  The session ended with scheduling.   Standardized Assessments completed: GAD-7 and PHQ 9 PHQ-9= 17 GAD-7= 17  Patient and/or Family Response: Patient expressed understanding and agreed with the plan.Patient expressed understanding and agreed with the plan.  Assessment: Patient currently experiencing see above .   Patient may benefit from see above .  Plan: Follow up with behavioral health clinician on : 08/21/2022 at 6:00 PM  Behavioral recommendations:  Referral(s): [facility] (In Clinic)  I discussed the assessment and treatment plan with the patient and/or parent/guardian. They were provided an opportunity to ask questions and all were answered. They agreed with the plan and demonstrated an understanding of the instructions.   They were advised to call back or seek an in-person evaluation if the symptoms worsen or if the condition fails to improve as anticipated.  Lesli Albee, LCSWA

## 2022-08-16 ENCOUNTER — Ambulatory Visit: Payer: Self-pay | Admitting: Gerontology

## 2022-08-16 ENCOUNTER — Other Ambulatory Visit: Payer: Self-pay | Admitting: Gerontology

## 2022-08-16 ENCOUNTER — Other Ambulatory Visit: Payer: Self-pay

## 2022-08-16 DIAGNOSIS — G4700 Insomnia, unspecified: Secondary | ICD-10-CM

## 2022-08-16 DIAGNOSIS — F332 Major depressive disorder, recurrent severe without psychotic features: Secondary | ICD-10-CM

## 2022-08-16 DIAGNOSIS — Z0000 Encounter for general adult medical examination without abnormal findings: Secondary | ICD-10-CM

## 2022-08-16 MED ORDER — SERTRALINE HCL 100 MG PO TABS
100.0000 mg | ORAL_TABLET | Freq: Every day | ORAL | 0 refills | Status: DC
  Filled 2022-08-16: qty 7, 7d supply, fill #0

## 2022-08-16 MED ORDER — TRAZODONE HCL 100 MG PO TABS
100.0000 mg | ORAL_TABLET | Freq: Every day | ORAL | 0 refills | Status: DC
  Filled 2022-08-16: qty 7, 7d supply, fill #0

## 2022-08-16 NOTE — Telephone Encounter (Signed)
Must keep OV with Heather on 08/21/22 and Benjamine Mola, NP on 08/22/22

## 2022-08-21 ENCOUNTER — Ambulatory Visit: Payer: Self-pay | Admitting: Licensed Clinical Social Worker

## 2022-08-22 ENCOUNTER — Ambulatory Visit: Payer: Self-pay | Admitting: Gerontology

## 2022-08-24 ENCOUNTER — Ambulatory Visit: Payer: Self-pay | Admitting: Gerontology

## 2022-08-24 ENCOUNTER — Encounter: Payer: Self-pay | Admitting: Gerontology

## 2022-08-24 ENCOUNTER — Other Ambulatory Visit: Payer: Self-pay

## 2022-08-24 ENCOUNTER — Other Ambulatory Visit: Payer: Self-pay | Admitting: Gerontology

## 2022-08-24 VITALS — BP 99/71 | HR 90 | Wt 206.0 lb

## 2022-08-24 DIAGNOSIS — F330 Major depressive disorder, recurrent, mild: Secondary | ICD-10-CM

## 2022-08-24 DIAGNOSIS — G4700 Insomnia, unspecified: Secondary | ICD-10-CM

## 2022-08-24 DIAGNOSIS — R519 Headache, unspecified: Secondary | ICD-10-CM

## 2022-08-24 DIAGNOSIS — Z0000 Encounter for general adult medical examination without abnormal findings: Secondary | ICD-10-CM

## 2022-08-24 DIAGNOSIS — F332 Major depressive disorder, recurrent severe without psychotic features: Secondary | ICD-10-CM

## 2022-08-24 MED ORDER — SERTRALINE HCL 100 MG PO TABS
100.0000 mg | ORAL_TABLET | Freq: Every day | ORAL | 0 refills | Status: DC
  Filled 2022-08-24: qty 1, 1d supply, fill #0

## 2022-08-24 MED ORDER — TRAZODONE HCL 100 MG PO TABS
100.0000 mg | ORAL_TABLET | Freq: Every day | ORAL | 0 refills | Status: DC
  Filled 2022-08-24: qty 1, 1d supply, fill #0

## 2022-08-24 NOTE — Progress Notes (Signed)
Established Patient Office Visit  Subjective   Patient ID: Latoya Benson, female    DOB: 09/04/1995  Age: 27 y.o. MRN: [id number]  Chief Complaint  Patient presents with   Follow-up    Labs and pregnancy test    HPI Latoya Benson is a 27 year old female who has a history of depression, gout, polycystic ovarian syndrome who presents for routine follow-up. She has been without trazadone and sertraline for almost two weeks. Her PHQ-9 score is 20 today. She is not sleeping well and feels depressed. Her living situation is a significant stressor in her life currently. She denies suicidal and homicidal ideations. We reviewed her lab work which revealed TSH 1.76, A1C 5.6%, triglycerides 392, HDL 34, LDL 95. Her diet consists of mostly fast food. 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 She denies alcohol use and gets exercise during her job and walking her dog at home. She denies chest pain or shortness of breath. She has been getting headaches in her bilateral temples every day at work for about one month. She takes Tylenol and lays down in a dark room to alleviate the pain. She endorses photophobia and "seeing spots" when this occurs. Otherwise, she offers no further complaints.  Review of Systems  Constitutional: Negative.   HENT: Negative.    Eyes: Negative.   Respiratory: Negative.    Cardiovascular: Negative.   Gastrointestinal: Negative.   Genitourinary: Negative.   Musculoskeletal: Negative.   Skin: Negative.   Neurological:  Positive for headaches.  Endo/Heme/Allergies: Negative.   Psychiatric/Behavioral:  Positive for depression. The patient has insomnia.      Objective:     BP 99/71   Pulse 90   Wt 206 lb (93.4 kg)   LMP 08/09/2022   SpO2 96%   BMI 37.68 kg/m  BP Readings from Last 3 Encounters:  08/24/22 99/71  08/09/22 109/63  04/19/22 120/77   Wt Readings from Last 3 Encounters:  08/24/22 206 lb (93.4 kg)  08/09/22 211  lb 14.4 oz (96.1 kg)  04/19/22 200 lb 8 oz (90.9 kg)      Physical Exam Constitutional:      Appearance: Normal appearance. She is normal weight.  Eyes:     Pupils: Pupils are equal, round, and reactive to light.  Cardiovascular:     Rate and Rhythm: Normal rate and regular rhythm.     Pulses: Normal pulses.     Heart sounds: Normal heart sounds.  Pulmonary:     Effort: Pulmonary effort is normal.     Breath sounds: Normal breath sounds.  Abdominal:     General: Bowel sounds are normal.     Palpations: Abdomen is soft.  Skin:    General: Skin is warm.     Capillary Refill: Capillary refill takes less than 2 seconds.  Neurological:     General: No focal deficit present.     Mental Status: She is alert and oriented to person, place, and time. Mental status is at baseline.  Psychiatric:        Mood and Affect: Mood is depressed.        Behavior: Behavior normal.        Thought Content: Thought content normal.        Judgment: Judgment normal.     Last CBC Lab Results  Component Value Date   WBC 7.4 08/09/2022   HGB 12.3 08/09/2022   HCT 38.0 08/09/2022   MCV  92 08/09/2022   MCH 29.6 08/09/2022   RDW 13.4 08/09/2022   PLT 464 (H) 123XX123   Last metabolic panel Lab Results  Component Value Date   GLUCOSE 81 08/09/2022   NA 142 08/09/2022   K 4.4 08/09/2022   CL 106 08/09/2022   CO2 18 (L) 08/09/2022   BUN 7 08/09/2022   CREATININE 0.78 08/09/2022   EGFR 106 08/09/2022   CALCIUM 9.7 08/09/2022   PROT 6.7 08/09/2022   ALBUMIN 4.1 08/09/2022   LABGLOB 2.6 08/09/2022   AGRATIO 1.6 08/09/2022   BILITOT 0.2 08/09/2022   ALKPHOS 56 08/09/2022   AST 12 08/09/2022   ALT 14 08/09/2022   ANIONGAP 9 07/27/2021   Last lipids Lab Results  Component Value Date   CHOL 194 08/09/2022   HDL 34 (L) 08/09/2022   LDLCALC 95 08/09/2022   TRIG 392 (H) 08/09/2022   CHOLHDL 5.7 (H) 08/09/2022   Last hemoglobin A1c Lab Results  Component Value Date   HGBA1C 5.6  08/09/2022   Last thyroid functions Lab Results  Component Value Date   TSH 1.760 08/09/2022   Last vitamin D Lab Results  Component Value Date   VD25OH 45.0 04/19/2022   Last vitamin B12 and Folate Lab Results  Component Value Date   VITAMINB12 982 04/19/2022   FOLATE 9.7 10/27/2020      The ASCVD Risk score (Arnett DK, et al., 2019) failed to calculate for the following reasons:   The 2019 ASCVD risk score is only valid for ages 23 to 66    Assessment & Plan:  1. Nonintractable headache, unspecified chronicity pattern, unspecified headache type -  Headache likely due to lack of sleep, she will pick up Trazodone from Pharmacy. She was encouraged to drink more water and to exercise as tolerated. - She will continue taking Tylenol as needed for headaches. She was advised to go to the ED for worsening symptoms.  2. Insomnia, unspecified type - She was encouraged to take her medication as prescribed. Will likely increase Trazodone dose to 150 mg pending negative pregnancy urine test. - traZODone (DESYREL) 100 MG tablet; Take 1 tablet (100 mg total) by mouth at bedtime.  Dispense: 1 tablet; Refill: 0  3. Severe episode of recurrent major depressive disorder, without psychotic features ([place]) - She will continue taking her medication as prescribed. - She will follow up with Heather in the clinic. - She was given the crisis hotline information. - sertraline (ZOLOFT) 100 MG tablet; Take 1 tablet (100 mg total) by mouth once daily.  Dispense: 1 tablet; Refill: 0   She will follow up in the clinic in 1 month, 09/21/22, or sooner if she worsens or fails to improve.   Eloise Harman, FNP Student

## 2022-08-24 NOTE — Patient Instructions (Signed)
Cholesterol Content in Foods Cholesterol is a waxy, fat-like substance that helps to carry fat in the blood. The body needs cholesterol in small amounts, but too much cholesterol can cause damage to the arteries and heart. What foods have cholesterol?  Cholesterol is found in animal-based foods, such as meat, seafood, and dairy. Generally, low-fat dairy and lean meats have less cholesterol than full-fat dairy and fatty meats. The milligrams of cholesterol per serving (mg per serving) of common cholesterol-containing foods are listed below. Meats and other proteins Egg -- one large whole egg has 186 mg. Veal shank -- 4 oz (113 g) has 141 mg. Lean ground [place] (93% lean) -- 4 oz (113 g) has 118 mg. Fat-trimmed lamb loin -- 4 oz (113 g) has 106 mg. Lean ground beef (90% lean) -- 4 oz (113 g) has 100 mg. Lobster -- 3.5 oz (99 g) has 90 mg. Pork loin chops -- 4 oz (113 g) has 86 mg. Canned salmon -- 3.5 oz (99 g) has 83 mg. Fat-trimmed beef top loin -- 4 oz (113 g) has 78 mg. Frankfurter -- 1 frank (3.5 oz or 99 g) has 77 mg. Crab -- 3.5 oz (99 g) has 71 mg. Roasted chicken without skin, white meat -- 4 oz (113 g) has 66 mg. Light bologna -- 2 oz (57 g) has 45 mg. Deli-cut [place] -- 2 oz (57 g) has 31 mg. Canned tuna -- 3.5 oz (99 g) has 31 mg. Berniece Salines -- 1 oz (28 g) has 29 mg. Oysters and mussels (raw) -- 3.5 oz (99 g) has 25 mg. Mackerel -- 1 oz (28 g) has 22 mg. Trout -- 1 oz (28 g) has 20 mg. Pork sausage -- 1 link (1 oz or 28 g) has 17 mg. Salmon -- 1 oz (28 g) has 16 mg. Tilapia -- 1 oz (28 g) has 14 mg. Dairy Soft-serve ice cream --  cup (4 oz or 86 g) has 103 mg. Whole-milk yogurt -- 1 cup (8 oz or 245 g) has 29 mg. Cheddar cheese -- 1 oz (28 g) has 28 mg. American cheese -- 1 oz (28 g) has 28 mg. Whole milk -- 1 cup (8 oz or 250 mL) has 23 mg. 2% milk -- 1 cup (8 oz or 250 mL) has 18 mg. Cream cheese -- 1 tablespoon (Tbsp) (14.5 g) has 15 mg. Cottage cheese --  cup (4 oz or  113 g) has 14 mg. Low-fat (1%) milk -- 1 cup (8 oz or 250 mL) has 10 mg. Sour cream -- 1 Tbsp (12 g) has 8.5 mg. Low-fat yogurt -- 1 cup (8 oz or 245 g) has 8 mg. Nonfat Greek yogurt -- 1 cup (8 oz or 228 g) has 7 mg. Half-and-half cream -- 1 Tbsp (15 mL) has 5 mg. Fats and oils Cod liver oil -- 1 tablespoon (Tbsp) (13.6 g) has 82 mg. Butter -- 1 Tbsp (14 g) has 15 mg. Lard -- 1 Tbsp (12.8 g) has 14 mg. Bacon grease -- 1 Tbsp (12.9 g) has 14 mg. Mayonnaise -- 1 Tbsp (13.8 g) has 5-10 mg. Margarine -- 1 Tbsp (14 g) has 3-10 mg. The items listed above may not be a complete list of foods with cholesterol. Exact amounts of cholesterol in these foods may vary depending on specific ingredients and brands. Contact a dietitian for more information. What foods do not have cholesterol? Most plant-based foods do not have cholesterol unless you combine them with a food that has  cholesterol. Foods without cholesterol include: Grains and cereals. Vegetables. Fruits. Vegetable oils, such as olive, canola, and sunflower oil. Legumes, such as peas, beans, and lentils. Nuts and seeds. Egg whites. The items listed above may not be a complete list of foods that do not have cholesterol. Contact a dietitian for more information. Summary The body needs cholesterol in small amounts, but too much cholesterol can cause damage to the arteries and heart. Cholesterol is found in animal-based foods, such as meat, seafood, and dairy. Generally, low-fat dairy and lean meats have less cholesterol than full-fat dairy and fatty meats. This information is not intended to replace advice given to you by your health care provider. Make sure you discuss any questions you have with your health care provider. Document Revised: 10/08/2020 Document Reviewed: 10/08/2020 Elsevier Patient Education  [place]. Major Depressive Disorder, Adult Major depressive disorder (MDD) is a mental health condition. People with this  disorder feel very sad, hopeless, and lose interest in things. Symptoms last most of the day, almost every day, for 2 weeks. MDD can affect: Relationships. Work and school. Things you usually like to do. What are the causes? The cause of MDD is not known. What increases the risk? Having family members with depression. Being female. Family problems. Alcohol or drug misuse. A lot of stress in your life, such as from: Living without basic needs such as food and housing. Being treated poorly because of race, sex, or religion (discrimination). Things that caused you pain as a child, especially if you lost a parent or were abused. Health and mental problems that you have had for a long time. What are the signs or symptoms? The main symptoms of this condition are: Being sad all the time. Being grouchy (irritable) all the time. Not enjoying the things you usually like. Sleeping too much or too little. Eating too much or too little. Feeling tired. Other symptoms include: Gaining or losing weight, without knowing why. Being restless and weak. Feeling hopeless, worthless, or guilty. Trouble thinking or making decisions. Thoughts of hurting yourself or others, or thoughts of ending your life. Spending a lot of time alone. Being unable to do daily tasks. If you have very bad MDD, you may: Believe things that are not true. Hear, see, taste, or feel things that are not there. Have mild depression that lasts for at least 2 years. Feel very sad and hopeless. Have trouble speaking or moving. Feel very sad during some seasons. How is this treated? Talk therapy. This teaches you about thoughts, feelings, and actions and how to change them. This can also help you to talk with others. This can be done with members of your family. Medicines. Lifestyle changes. You may need to: Limit alcohol use. Stop using drugs, if you use them. Exercise. Get plenty of sleep. Eat healthy. Spend more time  outdoors. Brain stimulation. This may be done when symptoms are very bad or have not gotten better. Follow these instructions at home: Alcohol use Do not drink alcohol if: Your health care provider tells you not to drink. You are pregnant, may be pregnant, or are planning to become pregnant. If you drink alcohol: Limit how much you use to: 0-1 drink a day for women. 0-2 drinks a day for men. Know how much alcohol is in your drink. In the [place] one drink equals one 12 oz bottle of beer (355 mL), one 5 oz glass of wine (148 mL), or one 1 oz glass of hard liquor (44  mL). Activity Exercise as told by your doctor. Spend time outdoors. Make time to do the things you enjoy. Find ways to deal with stress. Try to: Meditate. Do deep breathing. Spend time in nature. Keep a journal. Return to your normal activities when your doctor says that it is safe. General instructions  Take over-the-counter and prescription medicines only as told by your doctor. Talk to your doctor about: Alcohol use. It can affect medicines. Any drug use. Eat healthy foods. Get a lot of sleep. Think about joining a support group. Ask your doctor about that. Keep all follow-up visits. Your doctor will need to check on your mood, behavior, and medicines, and change your treatment as needed. Where to find more information: [employer] on Mental Illness: nami[employer] of Mental Health: https://www.frey.org/ American Psychiatric Association: psychiatry.org Contact a doctor if: You feel worse. You get new symptoms. Get help right away if: You hurt yourself on purpose (self-harm). You have thoughts about hurting yourself or others. You see, hear, taste, smell, or feel things that are not there. Get help right away if you feel like you may hurt yourself or others, or have thoughts about taking your own life. Go to your nearest emergency room or: Call 911. Call the [facility] at  [phone] or 988. This is open 24 hours a day. Text the Crisis Text Line at (607) 353-9361. This information is not intended to replace advice given to you by your health care provider. Make sure you discuss any questions you have with your health care provider. Document Revised: 10/04/2021 Document Reviewed: 10/04/2021 Elsevier Patient Education  [place].

## 2022-08-24 NOTE — Addendum Note (Signed)
Addended by: Meda Klinefelter on: 08/24/2022 07:24 PM   Modules accepted: Orders

## 2022-08-24 NOTE — Addendum Note (Signed)
Addended by: Meda Klinefelter on: 08/24/2022 07:31 PM   Modules accepted: Orders

## 2022-08-25 ENCOUNTER — Other Ambulatory Visit: Payer: Self-pay

## 2022-08-25 ENCOUNTER — Other Ambulatory Visit: Payer: Self-pay | Admitting: Gerontology

## 2022-08-25 DIAGNOSIS — G4700 Insomnia, unspecified: Secondary | ICD-10-CM

## 2022-08-25 DIAGNOSIS — F332 Major depressive disorder, recurrent severe without psychotic features: Secondary | ICD-10-CM

## 2022-08-25 LAB — PREGNANCY, URINE: Preg Test, Ur: NEGATIVE

## 2022-08-25 MED ORDER — SERTRALINE HCL 100 MG PO TABS
100.0000 mg | ORAL_TABLET | Freq: Every day | ORAL | 0 refills | Status: DC
  Filled 2022-08-25: qty 1, 1d supply, fill #0

## 2022-08-25 MED ORDER — TRAZODONE HCL 150 MG PO TABS
150.0000 mg | ORAL_TABLET | Freq: Every day | ORAL | 0 refills | Status: DC
  Filled 2022-08-25: qty 30, 30d supply, fill #0

## 2022-08-25 MED ORDER — SERTRALINE HCL 50 MG PO TABS
50.0000 mg | ORAL_TABLET | Freq: Every day | ORAL | 0 refills | Status: DC
  Filled 2022-08-25: qty 30, 30d supply, fill #0

## 2022-08-28 ENCOUNTER — Other Ambulatory Visit: Payer: Self-pay

## 2022-08-29 ENCOUNTER — Other Ambulatory Visit: Payer: Self-pay

## 2022-08-30 ENCOUNTER — Other Ambulatory Visit: Payer: Self-pay

## 2022-08-31 ENCOUNTER — Ambulatory Visit: Payer: Self-pay

## 2022-09-04 ENCOUNTER — Ambulatory Visit: Payer: Self-pay | Admitting: Licensed Clinical Social Worker

## 2022-09-04 DIAGNOSIS — F411 Generalized anxiety disorder: Secondary | ICD-10-CM

## 2022-09-04 DIAGNOSIS — F339 Major depressive disorder, recurrent, unspecified: Secondary | ICD-10-CM

## 2022-09-04 NOTE — BH Specialist Note (Signed)
Integrated Behavioral Health via Telemedicine Visit  09/04/2022 [name] [id number]  Number of [place] Clinician visits: No data recorded Session Start time: No data recorded  Session End time: No data recorded Total time in minutes: No data recorded  Referring Provider: Carlyon Shadow, NP Patient/Family location: [facility] Provider location: remote; [place], [place]  All persons participating in visit: Latoya Benson and Latoya Cairo, LCSW-A Types of Service: Telephone visit  I connected with Latoya Benson via  Telephone or Video Enabled Telemedicine Application  (Video is Caregility application) and verified that I am speaking with the correct person using two identifiers. Discussed confidentiality: Yes   I discussed the limitations of telemedicine and the availability of in person appointments.  Discussed there is a possibility of technology failure and discussed alternative modes of communication if that failure occurs.  Patient and/or legal guardian expressed understanding and consented to Telemedicine visit: Yes   Presenting Concerns: Patient and/or family reports the following symptoms/concerns: The patient reports that she has been doing okay since her last follow-up appointment. She noted that she understood the psychiatric consultant and her primary care provider's recommendations were to increase her sertraline and trazodone to 150 mg each.  The patient discussed increased financial and other situational stressors in her life currently.  She identified action steps such as looking for other housing options that she planned on focusing on over the next week. Mahnoor noted that she felt like having a place to vent her frustrations in therapy to be most helpful.  The patient stated that she would like to continue to work with her primary care provider Carlyon Shadow, NP for medication management.  The patient denied any  suicidal or homicidal thoughts. Duration of problem: Years; Severity of problem: moderate  Patient and/or Family's Strengths/Protective Factors: Sense of purpose, Physical Health (exercise, healthy diet, medication compliance, etc.), and Caregiver has knowledge of parenting & child development  Goals Addressed: Patient will:  Reduce symptoms of: agitation, anxiety, depression, and stress   Increase knowledge and/or ability of: coping skills, healthy habits, self-management skills, and stress reduction   Demonstrate ability to: Increase healthy adjustment to current life circumstances  Progress towards Goals: Other  Interventions/Plan: Interventions utilized:  Link to [employer] and Supportive Reflection  was utilized by [employer] during today's follow up appointment.  Clinician met with patient to end therapy and transition the patient to another provider, monitor for signs and symptoms of anxiety and depression, and assess safety. The clinician processed with the patient how they have been doing since the last follow-up session.  Clinician measured the patient's anxiety and depression on a numerical scale.  Clinician processed with the patient their progress in therapy, provided a safe judgmental-free space for the patient to express their feelings regarding ending therapy, and addressed any of the patient's questions and concerns.  Clinician provided the patient with a referral to [facility] and instructions on how to establish care with them.  Clinician provided the patient with crisis resources should the patient need further support during this transition.  Clinician monitored the patient's use and satisfaction with her prescribed psychotropic medication and determined the patient reported taking her medication exactly as prescribed.  Clinician explained that the patient's psychotropic medications would be managed by her primary care provider, Carlyon Shadow, NP of the [facility].   Standardized Assessments completed: GAD-7 and PHQ 2 GAD-7=16 PHQ-9=17  Patient and/or Family Response: Patient expressed understanding and agreed with  the plan.  Assessment: Patient currently experiencing see above .   Patient may benefit from see above .  Plan: Follow up with behavioral health clinician on :  Behavioral recommendations:  Referral(s): [facility] (LME/Outside Clinic) referral to [facility]  I discussed the assessment and treatment plan with the patient and/or parent/guardian. They were provided an opportunity to ask questions and all were answered. They agreed with the plan and demonstrated an understanding of the instructions.   They were advised to call back or seek an in-person evaluation if the symptoms worsen or if the condition fails to improve as anticipated.  Lesli Albee, LCSWA

## 2022-09-12 ENCOUNTER — Other Ambulatory Visit: Payer: Self-pay | Admitting: Gerontology

## 2022-09-12 ENCOUNTER — Other Ambulatory Visit: Payer: Self-pay

## 2022-09-12 DIAGNOSIS — G4700 Insomnia, unspecified: Secondary | ICD-10-CM

## 2022-09-12 DIAGNOSIS — F332 Major depressive disorder, recurrent severe without psychotic features: Secondary | ICD-10-CM

## 2022-09-13 ENCOUNTER — Other Ambulatory Visit: Payer: Self-pay | Admitting: Gerontology

## 2022-09-13 ENCOUNTER — Other Ambulatory Visit: Payer: Self-pay

## 2022-09-13 DIAGNOSIS — G4700 Insomnia, unspecified: Secondary | ICD-10-CM

## 2022-09-13 MED ORDER — SERTRALINE HCL 100 MG PO TABS
100.0000 mg | ORAL_TABLET | Freq: Every day | ORAL | 0 refills | Status: DC
  Filled 2022-09-13: qty 10, 10d supply, fill #0

## 2022-09-13 NOTE — Telephone Encounter (Signed)
Patient requested refill too soon. Last filled 08/25/22 for #30.

## 2022-09-14 ENCOUNTER — Encounter: Payer: Self-pay | Admitting: Gerontology

## 2022-09-14 ENCOUNTER — Ambulatory Visit: Payer: Self-pay | Admitting: Gerontology

## 2022-09-14 ENCOUNTER — Other Ambulatory Visit: Payer: Self-pay

## 2022-09-14 VITALS — BP 122/83 | HR 106 | Temp 97.9°F | Resp 16 | Ht 62.0 in | Wt 207.6 lb

## 2022-09-14 DIAGNOSIS — R112 Nausea with vomiting, unspecified: Secondary | ICD-10-CM | POA: Insufficient documentation

## 2022-09-14 DIAGNOSIS — R197 Diarrhea, unspecified: Secondary | ICD-10-CM

## 2022-09-14 MED ORDER — ONDANSETRON HCL 4 MG PO TABS
4.0000 mg | ORAL_TABLET | Freq: Three times a day (TID) | ORAL | 0 refills | Status: DC | PRN
  Filled 2022-09-14: qty 4, 2d supply, fill #0

## 2022-09-14 NOTE — Progress Notes (Signed)
Established Patient Office Visit  Subjective   Patient ID: Latoya Benson, female    DOB: 12/19/1995  Age: 27 y.o. MRN: [id number]  Chief Complaint  Patient presents with   Follow-up   Emesis    Patient c/o vomiting and diarrhea x this morning.    HPI  Latoya Benson is a 27 year old female who has a history of depression, gout, polycystic ovarian syndrome who presents for c/o nausea, vomiting and diahhrea when she woke up this morning. She states that she vomitted once, but have been having watery stool throught out the day. She reports eating chicken, rice and green peas at work yesterday. She states that she has not eating anything, but has been drinking water which comes right out immediatley.  She denies fever, chills, hematochezia, but she endorses intermittent abdominal cramps. Overall, she states that she's doing well and offer no further complaint.  Review of Systems  Constitutional: Negative.  Negative for chills and fever.  Respiratory: Negative.    Cardiovascular: Negative.   Gastrointestinal:  Positive for diarrhea and vomiting. Negative for blood in stool.  Neurological: Negative.       Objective:     BP 122/83 (BP Location: Left Arm, Patient Position: Sitting, Cuff Size: Normal)   Pulse (!) 106   Temp 97.9 F (36.6 C) (Oral)   Resp 16   Ht 5\' 2"  (1.575 m)   Wt 207 lb 9.6 oz (94.2 kg)   LMP 08/09/2022 (Approximate) Comment: PCOS  SpO2 96%   BMI 37.97 kg/m  BP Readings from Last 3 Encounters:  09/14/22 122/83  08/24/22 99/71  08/09/22 109/63   Wt Readings from Last 3 Encounters:  09/14/22 207 lb 9.6 oz (94.2 kg)  08/24/22 206 lb (93.4 kg)  08/09/22 211 lb 14.4 oz (96.1 kg)      Physical Exam HENT:     Head: Normocephalic and atraumatic.     Mouth/Throat:     Mouth: Mucous membranes are moist.  Eyes:     Conjunctiva/sclera: Conjunctivae normal.     Pupils: Pupils are equal, round, and reactive to light.  Cardiovascular:     Rate and  Rhythm: Normal rate and regular rhythm.     Pulses: Normal pulses.     Heart sounds: Normal heart sounds.  Pulmonary:     Effort: Pulmonary effort is normal.     Breath sounds: Normal breath sounds.  Abdominal:     General: Bowel sounds are normal.     Palpations: Abdomen is soft.     Tenderness: There is abdominal tenderness (lower abdomen with palpation).  Skin:    General: Skin is warm.  Neurological:     General: No focal deficit present.     Mental Status: She is alert and oriented to person, place, and time. Mental status is at baseline.      No results found for any visits on 09/14/22.  Last CBC Lab Results  Component Value Date   WBC 7.4 08/09/2022   HGB 12.3 08/09/2022   HCT 38.0 08/09/2022   MCV 92 08/09/2022   MCH 29.6 08/09/2022   RDW 13.4 08/09/2022   PLT 464 (H) 123XX123   Last metabolic panel Lab Results  Component Value Date   GLUCOSE 81 08/09/2022   NA 142 08/09/2022   K 4.4 08/09/2022   CL 106 08/09/2022   CO2 18 (L) 08/09/2022   BUN 7 08/09/2022   CREATININE 0.78 08/09/2022   EGFR 106 08/09/2022   CALCIUM 9.7  08/09/2022   PROT 6.7 08/09/2022   ALBUMIN 4.1 08/09/2022   LABGLOB 2.6 08/09/2022   AGRATIO 1.6 08/09/2022   BILITOT 0.2 08/09/2022   ALKPHOS 56 08/09/2022   AST 12 08/09/2022   ALT 14 08/09/2022   ANIONGAP 9 07/27/2021   Last lipids Lab Results  Component Value Date   CHOL 194 08/09/2022   HDL 34 (L) 08/09/2022   LDLCALC 95 08/09/2022   TRIG 392 (H) 08/09/2022   CHOLHDL 5.7 (H) 08/09/2022   Last hemoglobin A1c Lab Results  Component Value Date   HGBA1C 5.6 08/09/2022   Last thyroid functions Lab Results  Component Value Date   TSH 1.760 08/09/2022   Last vitamin D Lab Results  Component Value Date   VD25OH 45.0 04/19/2022      The ASCVD Risk score (Arnett DK, et al., 2019) failed to calculate for the following reasons:   The 2019 ASCVD risk score is only valid for ages 19 to 13    Assessment & Plan:   1.  Diarrhea, unspecified type - Possible viral etiology from food consumed yesterday. She was encouraged to increase fluid intake, take 650 mg Tylenol every 8 hours for abdominal cramps and go to the ED for worsening symptoms.  2. Nausea and vomiting, unspecified vomiting type - She was started on zofran, educated on medication side effects and advised to notify clinic and go to the ED for worsening symptoms. - ondansetron (ZOFRAN) 4 MG tablet; Take 1 tablet (4 mg total) by mouth every 8 (eight) hours as needed for nausea or vomiting.  Dispense: 4 tablet; Refill: 0    Return in about 5 weeks (around 10/19/2022), or if symptoms worsen or fail to improve.    Elbony Mcclimans Jerold Coombe, NP

## 2022-09-14 NOTE — Patient Instructions (Signed)
Diarrhea, Adult Diarrhea is when you pass loose and sometimes watery poop (stool) often. Diarrhea can make you feel weak and cause you to lose water in your body (get dehydrated). Losing water in your body can cause you to: Feel tired and thirsty. Have a dry mouth. Go pee (urinate) less often. Diarrhea often lasts 2-3 days. It can last longer if it is a sign of something more serious. Be sure to treat your diarrhea as told by your doctor. Follow these instructions at home: Eating and drinking     Follow these instructions as told by your doctor: Take an ORS (oral rehydration solution). This is a drink that helps you replace fluids and minerals your body lost. It is sold at pharmacies and stores. Drink enough fluid to keep your pee (urine) pale yellow. Drink fluids such as: Water. You can also get fluids by sucking on ice chips. Diluted fruit juice. Low-calorie sports drinks. Milk. Avoid drinking fluids that have a lot of sugar or caffeine in them. These include soda, energy drinks, and regular sports drinks. Avoid alcohol. Eat bland, easy-to-digest foods in small amounts as you are able. These foods include: Bananas. Applesauce. Rice. Low-fat (lean) meats. Toast. Crackers. Avoid spicy or fatty foods.  Medicines Take over-the-counter and prescription medicines only as told by your doctor. If you were prescribed antibiotics, take them as told by your doctor. Do not stop taking them even if you start to feel better. General instructions  Wash your hands often using soap and water for 20 seconds. If soap and water are not available, use hand sanitizer. Others in your home should wash their hands as well. Wash your hands: After using the toilet or changing a diaper. Before preparing, cooking, or serving food. While caring for a sick person. While visiting someone in a hospital. Rest at home while you get better. Take a warm bath to help with any burning or pain from having  diarrhea. Watch your condition for any changes. Contact a doctor if: You have a fever. Your diarrhea gets worse. You have new symptoms. You vomit every time you eat or drink. You feel light-headed, dizzy, or you have a headache. You have muscle cramps. You have signs of losing too much water in your body, such as: Dark pee, very little pee, or no pee. Cracked lips. Dry mouth. Sunken eyes. Sleepiness. Weakness. You have bloody or black poop or poop that looks like tar. You have very bad pain, cramping, or bloating in your belly (abdomen). Your skin feels cold and clammy. You feel confused. Get help right away if: You have chest pain. Your heart is beating very quickly. You have trouble breathing or you are breathing very quickly. You feel very weak or you faint. These symptoms may be an emergency. Get help right away. Call 911. Do not wait to see if the symptoms will go away. Do not drive yourself to the hospital. This information is not intended to replace advice given to you by your health care provider. Make sure you discuss any questions you have with your health care provider. Document Revised: 11/15/2021 Document Reviewed: 11/15/2021 Elsevier Patient Education  2023 [employer] Inc[employer]  

## 2022-09-25 ENCOUNTER — Other Ambulatory Visit: Payer: Self-pay | Admitting: Gerontology

## 2022-09-25 DIAGNOSIS — G4700 Insomnia, unspecified: Secondary | ICD-10-CM

## 2022-09-25 DIAGNOSIS — F332 Major depressive disorder, recurrent severe without psychotic features: Secondary | ICD-10-CM

## 2022-09-26 ENCOUNTER — Other Ambulatory Visit: Payer: Self-pay

## 2022-09-26 IMAGING — CR DG WRIST COMPLETE 3+V*R*
4 series · 4 of 4 positions shown · non-contrast
Comparison: None.

CLINICAL DATA: Pain following fall

EXAM:
RIGHT WRIST - COMPLETE 3+ VIEW

[wrist pa]
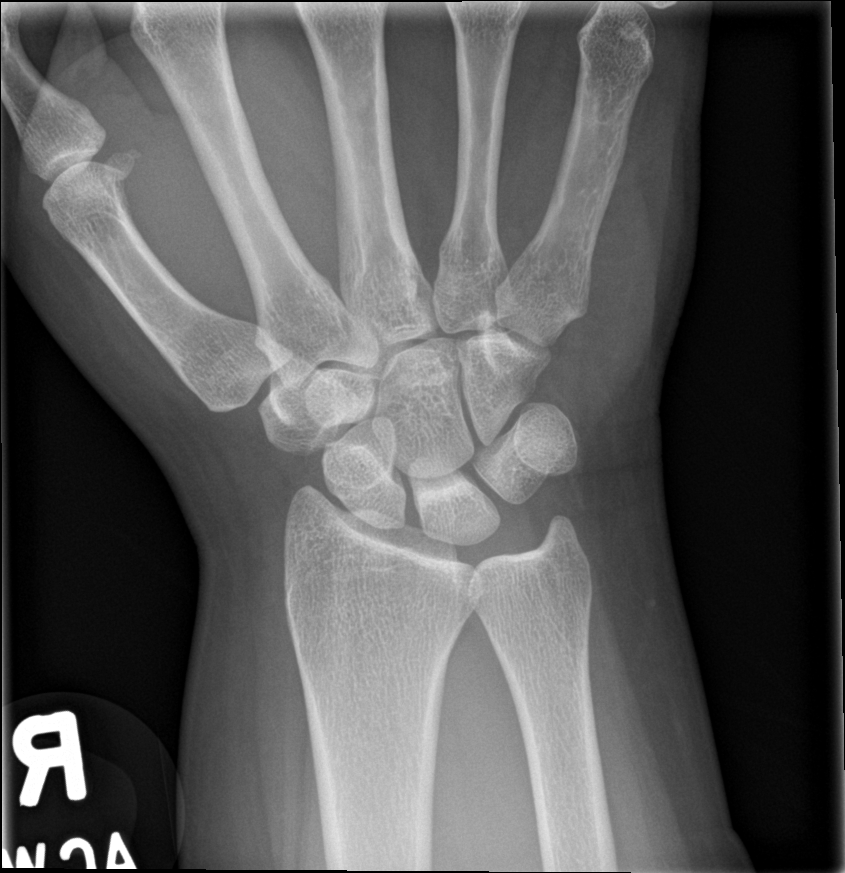

[wrist obl]
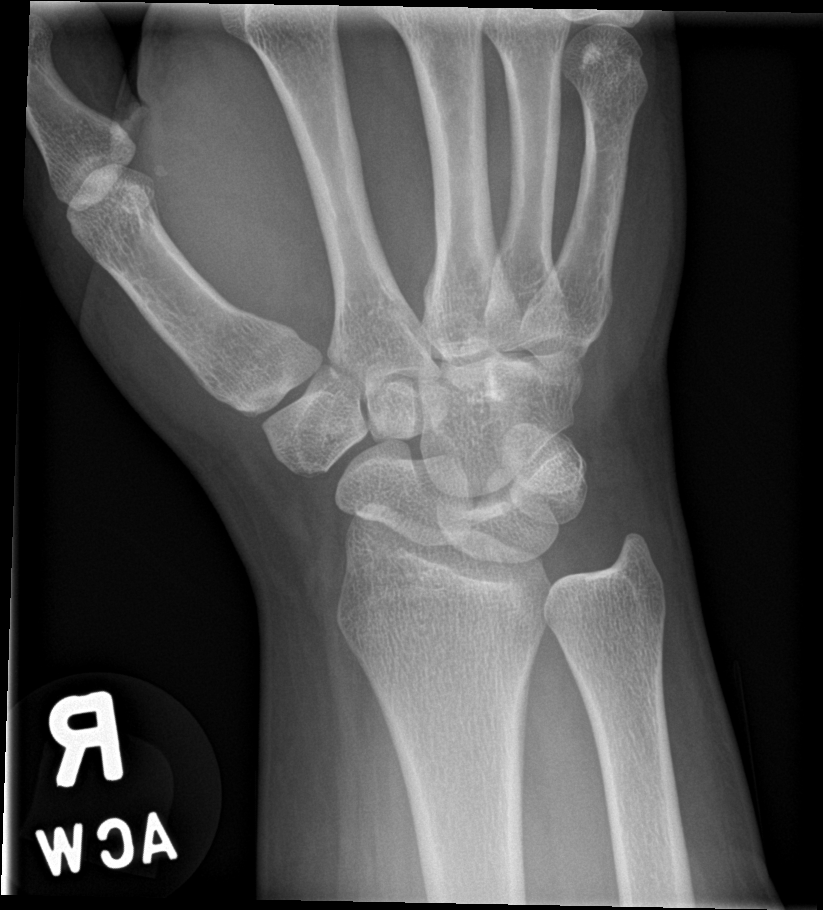

[wrist lat]
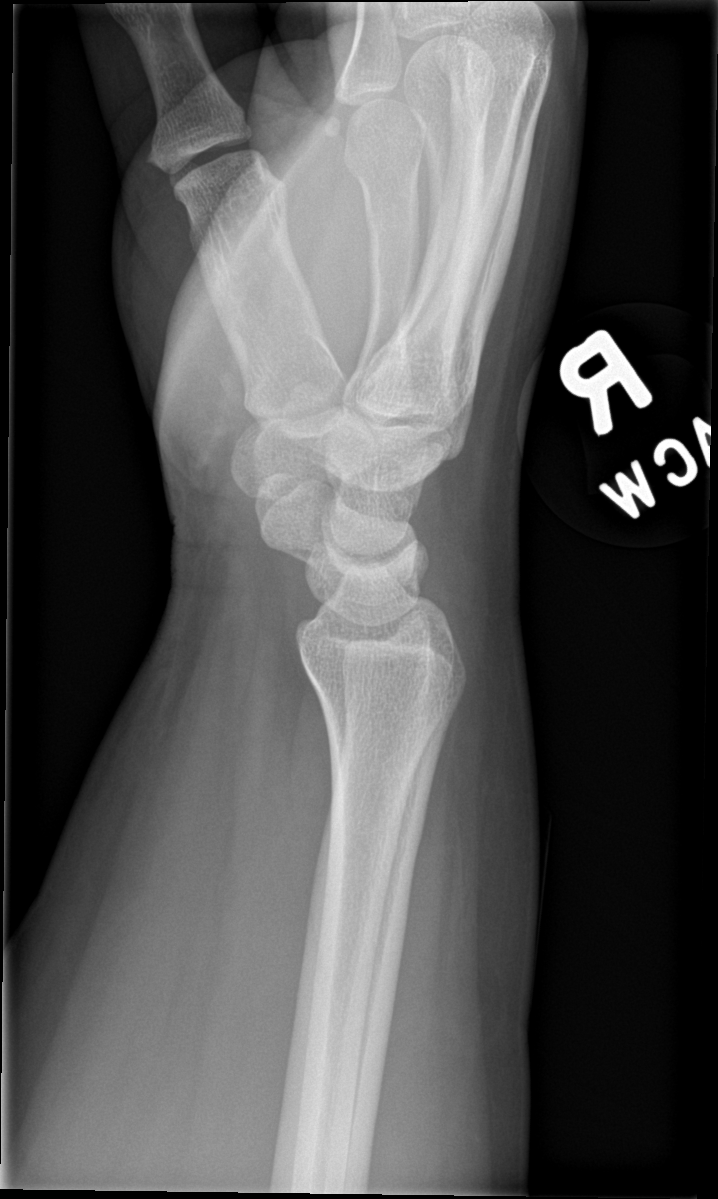

[navicular]
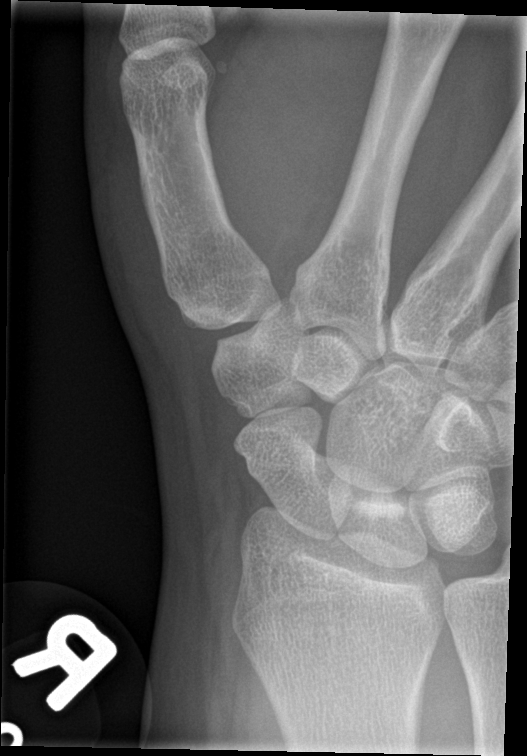

[4 of 4 positions shown; findings below may reference images not displayed]

FINDINGS: Frontal, oblique, lateral, and ulnar deviation scaphoid images were
obtained. No fracture or dislocation. Joint spaces appear normal. No
erosive change.
IMPRESSION: No fracture or dislocation.  No evident arthropathy.

## 2022-09-26 MED ORDER — SERTRALINE HCL 50 MG PO TABS
50.0000 mg | ORAL_TABLET | Freq: Every day | ORAL | 0 refills | Status: DC
  Filled 2022-09-26: qty 30, 30d supply, fill #0

## 2022-09-26 MED ORDER — SERTRALINE HCL 100 MG PO TABS
100.0000 mg | ORAL_TABLET | Freq: Every day | ORAL | 0 refills | Status: DC
  Filled 2022-09-26: qty 30, 30d supply, fill #0

## 2022-09-26 MED ORDER — TRAZODONE HCL 150 MG PO TABS
150.0000 mg | ORAL_TABLET | Freq: Every day | ORAL | 0 refills | Status: DC
  Filled 2022-09-26: qty 30, 30d supply, fill #0

## 2022-10-03 ENCOUNTER — Telehealth: Payer: Self-pay | Admitting: Gerontology

## 2022-10-10 ENCOUNTER — Other Ambulatory Visit: Payer: Self-pay

## 2022-10-10 ENCOUNTER — Ambulatory Visit: Payer: Self-pay | Admitting: Gerontology

## 2022-10-10 DIAGNOSIS — G4700 Insomnia, unspecified: Secondary | ICD-10-CM

## 2022-10-10 DIAGNOSIS — F332 Major depressive disorder, recurrent severe without psychotic features: Secondary | ICD-10-CM

## 2022-10-10 MED ORDER — SERTRALINE HCL 100 MG PO TABS
100.0000 mg | ORAL_TABLET | Freq: Every day | ORAL | 0 refills | Status: DC
Start: 2022-10-10 — End: 2022-11-16
  Filled 2022-10-10 – 2022-10-25 (×2): qty 30, 30d supply, fill #0

## 2022-10-10 MED ORDER — TRAZODONE HCL 150 MG PO TABS
150.0000 mg | ORAL_TABLET | Freq: Every day | ORAL | 0 refills | Status: DC
Start: 2022-10-10 — End: 2022-11-16
  Filled 2022-10-10 – 2022-10-27 (×2): qty 30, 30d supply, fill #0

## 2022-10-10 MED ORDER — SERTRALINE HCL 50 MG PO TABS
50.0000 mg | ORAL_TABLET | Freq: Every day | ORAL | 0 refills | Status: DC
  Filled 2022-10-10 – 2022-10-25 (×2): qty 30, 30d supply, fill #0

## 2022-10-10 NOTE — Progress Notes (Signed)
Established Patient Office Visit  Subjective   Patient ID: Latoya Benson, female    DOB: 1995/07/08  Age: 27 y.o. MRN: [id number]  No chief complaint on file.   HPI I connected with Tonye Pearson via  Telephone or Video Enabled Telemedicine Application  (Video is Caregility application) and verified that I am speaking with the correct person using two identifiers. Discussed confidentiality: Yes   I discussed the limitations of telemedicine and the availability of in person appointments.  Discussed there is a possibility of technology failure and discussed alternative modes of communication if that failure occurs.   Patient and/or legal guardian expressed understanding and consented to Telemedicine visit: Yes   She reports that she's being doing well with the current dose of Sertraline 150 mg and Trazodone 150 mg. She states that her mood was good, denies suicidal nor homicidal ideation. She states that she's compliant with her medications and denies side effects. Overall, she states that she's doing well and offers no further complaint. GAD-7 = 1 PHQ-9 = 6   Review of Systems  Respiratory: Negative.    Cardiovascular: Negative.   Neurological: Negative.   Psychiatric/Behavioral: Negative.        Objective:     LMP 08/09/2022 (Approximate) Comment: PCOS BP Readings from Last 3 Encounters:  09/14/22 122/83  08/24/22 99/71  08/09/22 109/63   Wt Readings from Last 3 Encounters:  09/14/22 207 lb 9.6 oz (94.2 kg)  08/24/22 206 lb (93.4 kg)  08/09/22 211 lb 14.4 oz (96.1 kg)      Physical Exam   No results found for any visits on 10/10/22.  Last CBC Lab Results  Component Value Date   WBC 7.4 08/09/2022   HGB 12.3 08/09/2022   HCT 38.0 08/09/2022   MCV 92 08/09/2022   MCH 29.6 08/09/2022   RDW 13.4 08/09/2022   PLT 464 (H) 08/09/2022   Last metabolic panel Lab Results  Component Value Date   GLUCOSE 81 08/09/2022   NA 142 08/09/2022   K 4.4  08/09/2022   CL 106 08/09/2022   CO2 18 (L) 08/09/2022   BUN 7 08/09/2022   CREATININE 0.78 08/09/2022   EGFR 106 08/09/2022   CALCIUM 9.7 08/09/2022   PROT 6.7 08/09/2022   ALBUMIN 4.1 08/09/2022   LABGLOB 2.6 08/09/2022   AGRATIO 1.6 08/09/2022   BILITOT 0.2 08/09/2022   ALKPHOS 56 08/09/2022   AST 12 08/09/2022   ALT 14 08/09/2022   ANIONGAP 9 07/27/2021   Last lipids Lab Results  Component Value Date   CHOL 194 08/09/2022   HDL 34 (L) 08/09/2022   LDLCALC 95 08/09/2022   TRIG 392 (H) 08/09/2022   CHOLHDL 5.7 (H) 08/09/2022   Last hemoglobin A1c Lab Results  Component Value Date   HGBA1C 5.6 08/09/2022   Last thyroid functions Lab Results  Component Value Date   TSH 1.760 08/09/2022      The ASCVD Risk score (Arnett DK, et al., 2019) failed to calculate for the following reasons:   The 2019 ASCVD risk score is only valid for ages 6 to 83    Assessment & Plan:   1. Severe episode of recurrent major depressive disorder, without psychotic features (HCC) - Her symptoms are improving, will continue on current medication. She was advised to call the Crisis helpline with worsening symptoms. - sertraline (ZOLOFT) 50 MG tablet; Take 1 tablet (50 mg total) by mouth daily.  Dispense: 30 tablet; Refill: 0 - sertraline (ZOLOFT)  100 MG tablet; Take 1 tablet (100 mg total) by mouth once daily.  Dispense: 30 tablet; Refill: 0  2. Insomnia, unspecified type - Her symptoms are improving, will continue on current medication. She was advised to call the Crisis helpline with worsening symptoms. - traZODone (DESYREL) 150 MG tablet; Take 1 tablet (150 mg total) by mouth at bedtime.  Dispense: 30 tablet; Refill: 0   Return if symptoms worsen or fail to improve.    Hiromi Knodel Trellis Paganini, NP

## 2022-10-11 ENCOUNTER — Other Ambulatory Visit: Payer: Self-pay

## 2022-10-12 ENCOUNTER — Other Ambulatory Visit: Payer: Self-pay

## 2022-10-13 ENCOUNTER — Other Ambulatory Visit: Payer: Self-pay

## 2022-10-19 ENCOUNTER — Ambulatory Visit: Payer: Self-pay

## 2022-10-26 ENCOUNTER — Other Ambulatory Visit: Payer: Self-pay

## 2022-10-27 ENCOUNTER — Other Ambulatory Visit: Payer: Self-pay

## 2022-11-16 ENCOUNTER — Ambulatory Visit: Payer: Self-pay | Admitting: Gerontology

## 2022-11-16 ENCOUNTER — Other Ambulatory Visit: Payer: Self-pay

## 2022-11-16 DIAGNOSIS — M1A09X0 Idiopathic chronic gout, multiple sites, without tophus (tophi): Secondary | ICD-10-CM

## 2022-11-16 DIAGNOSIS — F332 Major depressive disorder, recurrent severe without psychotic features: Secondary | ICD-10-CM

## 2022-11-16 DIAGNOSIS — G4700 Insomnia, unspecified: Secondary | ICD-10-CM

## 2022-11-16 MED ORDER — ALLOPURINOL 100 MG PO TABS
100.0000 mg | ORAL_TABLET | Freq: Two times a day (BID) | ORAL | 5 refills | Status: DC
Start: 2022-11-16 — End: 2023-05-02
  Filled 2022-11-16: qty 60, fill #0
  Filled 2022-11-22: qty 60, 30d supply, fill #0
  Filled 2022-12-17: qty 60, 30d supply, fill #1
  Filled 2023-01-24: qty 60, 30d supply, fill #2
  Filled 2023-02-19: qty 60, 30d supply, fill #3
  Filled 2023-03-27: qty 60, 30d supply, fill #4
  Filled 2023-04-24: qty 60, 30d supply, fill #5

## 2022-11-16 MED ORDER — SERTRALINE HCL 100 MG PO TABS
100.0000 mg | ORAL_TABLET | Freq: Every day | ORAL | 0 refills | Status: DC
Start: 2022-11-16 — End: 2022-12-19
  Filled 2022-11-16 – 2022-11-22 (×2): qty 30, 30d supply, fill #0

## 2022-11-16 MED ORDER — SERTRALINE HCL 50 MG PO TABS
50.0000 mg | ORAL_TABLET | Freq: Every day | ORAL | 0 refills | Status: DC
Start: 2022-11-16 — End: 2022-12-19
  Filled 2022-11-16 – 2022-11-22 (×2): qty 30, 30d supply, fill #0

## 2022-11-16 MED ORDER — TRAZODONE HCL 150 MG PO TABS
150.0000 mg | ORAL_TABLET | Freq: Every day | ORAL | 0 refills | Status: DC
Start: 2022-11-16 — End: 2022-12-19
  Filled 2022-11-16 – 2022-11-22 (×2): qty 30, 30d supply, fill #0

## 2022-11-16 NOTE — Progress Notes (Signed)
Established Patient Office Visit  Subjective   Patient ID: Latoya Benson, female    DOB: 1995/09/26  Age: 27 y.o. MRN: [id number]  No chief complaint on file.  I connected with Tonye Pearson via  Telephone or Video Enabled Telemedicine Application  (Video is Caregility application) and verified that I am speaking with the correct person using two identifiers. Discussed confidentiality: Yes   I discussed the limitations of telemedicine and the availability of in person appointments.  Discussed there is a possibility of technology failure and discussed alternative modes of communication if that failure occurs.   Patient and/or legal guardian expressed understanding and consented to Telemedicine visit: Yes      HPI  Latoya Benson is a 27 year old female who has a history of depression, gout, polycystic ovarian syndrome who presents for follow up visit and medication refill.  She reports that she's being doing well with the current dose of Sertraline 150 mg and Trazodone 150 mg. She states that her mood was good, denies suicidal nor homicidal ideation. She states that she's compliant with her medications and denies side effects. Overall, she states that she's doing well and offers no further complaint.   GAD-7 = 0 PHQ-9 = 0  Review of Systems  Eyes: Negative.   Respiratory: Negative.    Cardiovascular: Negative.   Neurological: Negative.   Psychiatric/Behavioral: Negative.        Objective:     There were no vitals taken for this visit. BP Readings from Last 3 Encounters:  09/14/22 122/83  08/24/22 99/71  08/09/22 109/63   Wt Readings from Last 3 Encounters:  09/14/22 207 lb 9.6 oz (94.2 kg)  08/24/22 206 lb (93.4 kg)  08/09/22 211 lb 14.4 oz (96.1 kg)      Physical Exam   No results found for any visits on 11/16/22.  Last CBC Lab Results  Component Value Date   WBC 7.4 08/09/2022   HGB 12.3 08/09/2022   HCT 38.0 08/09/2022   MCV 92 08/09/2022    MCH 29.6 08/09/2022   RDW 13.4 08/09/2022   PLT 464 (H) 08/09/2022   Last metabolic panel Lab Results  Component Value Date   GLUCOSE 81 08/09/2022   NA 142 08/09/2022   K 4.4 08/09/2022   CL 106 08/09/2022   CO2 18 (L) 08/09/2022   BUN 7 08/09/2022   CREATININE 0.78 08/09/2022   EGFR 106 08/09/2022   CALCIUM 9.7 08/09/2022   PROT 6.7 08/09/2022   ALBUMIN 4.1 08/09/2022   LABGLOB 2.6 08/09/2022   AGRATIO 1.6 08/09/2022   BILITOT 0.2 08/09/2022   ALKPHOS 56 08/09/2022   AST 12 08/09/2022   ALT 14 08/09/2022   ANIONGAP 9 07/27/2021   Last lipids Lab Results  Component Value Date   CHOL 194 08/09/2022   HDL 34 (L) 08/09/2022   LDLCALC 95 08/09/2022   TRIG 392 (H) 08/09/2022   CHOLHDL 5.7 (H) 08/09/2022   Last hemoglobin A1c Lab Results  Component Value Date   HGBA1C 5.6 08/09/2022   Last thyroid functions Lab Results  Component Value Date   TSH 1.760 08/09/2022   Last vitamin D Lab Results  Component Value Date   VD25OH 45.0 04/19/2022   Last vitamin B12 and Folate Lab Results  Component Value Date   VITAMINB12 982 04/19/2022   FOLATE 9.7 10/27/2020      The ASCVD Risk score (Arnett DK, et al., 2019) failed to calculate for the following reasons:   The  2019 ASCVD risk score is only valid for ages 19 to 103    Assessment & Plan:   1. Chronic gout of multiple sites, unspecified cause - She denies any gout flare and will continue current medication. - allopurinol (ZYLOPRIM) 100 MG tablet; TAKE ONE TABLET BY MOUTH 2 TIMES A DAY  Dispense: 60 tablet; Refill: 5  2. Severe episode of recurrent major depressive disorder, without psychotic features (HCC) - Her depression has improved, will continue current medication. She was advised to call the Crisis help line with worsening symptoms. - sertraline (ZOLOFT) 100 MG tablet; Take 1 tablet (100 mg total) by mouth once daily.  Dispense: 30 tablet; Refill: 0 - sertraline (ZOLOFT) 50 MG tablet; Take 1 tablet (50 mg  total) by mouth daily.  Dispense: 30 tablet; Refill: 0  3. Insomnia, unspecified type - She states that her sleep has improved, she will continue current medication, was educated on signs and symptoms of serotonin syndrome and to go the ED. She was advised to call the Crisis help line with worsening symptoms. - traZODone (DESYREL) 150 MG tablet; Take 1 tablet (150 mg total) by mouth at bedtime.  Dispense: 30 tablet; Refill: 0   Return in about 1 month (around 12/19/2022), or if symptoms worsen or fail to improve.    Chasya Zenz Trellis Paganini, NP

## 2022-11-17 ENCOUNTER — Other Ambulatory Visit: Payer: Self-pay

## 2022-11-22 ENCOUNTER — Other Ambulatory Visit: Payer: Self-pay

## 2022-12-17 ENCOUNTER — Other Ambulatory Visit: Payer: Self-pay

## 2022-12-17 ENCOUNTER — Other Ambulatory Visit: Payer: Self-pay | Admitting: Gerontology

## 2022-12-17 DIAGNOSIS — G4700 Insomnia, unspecified: Secondary | ICD-10-CM

## 2022-12-17 DIAGNOSIS — F332 Major depressive disorder, recurrent severe without psychotic features: Secondary | ICD-10-CM

## 2022-12-18 ENCOUNTER — Other Ambulatory Visit: Payer: Self-pay

## 2022-12-18 ENCOUNTER — Other Ambulatory Visit: Payer: Self-pay | Admitting: Gerontology

## 2022-12-18 DIAGNOSIS — F332 Major depressive disorder, recurrent severe without psychotic features: Secondary | ICD-10-CM

## 2022-12-18 DIAGNOSIS — G4700 Insomnia, unspecified: Secondary | ICD-10-CM

## 2022-12-19 ENCOUNTER — Other Ambulatory Visit: Payer: Self-pay

## 2022-12-19 MED FILL — Sertraline HCl Tab 100 MG: ORAL | 30 days supply | Qty: 30 | Fill #0 | Status: AC

## 2022-12-19 MED FILL — Sertraline HCl Tab 50 MG: ORAL | 30 days supply | Qty: 30 | Fill #0 | Status: AC

## 2022-12-19 MED FILL — Trazodone HCl Tab 150 MG: ORAL | 30 days supply | Qty: 30 | Fill #0 | Status: AC

## 2022-12-21 ENCOUNTER — Other Ambulatory Visit: Payer: Self-pay

## 2022-12-21 ENCOUNTER — Ambulatory Visit: Payer: Self-pay

## 2022-12-28 ENCOUNTER — Ambulatory Visit: Payer: Self-pay | Admitting: Gerontology

## 2022-12-28 DIAGNOSIS — F332 Major depressive disorder, recurrent severe without psychotic features: Secondary | ICD-10-CM

## 2022-12-28 DIAGNOSIS — G4700 Insomnia, unspecified: Secondary | ICD-10-CM

## 2022-12-28 MED ORDER — SERTRALINE HCL 50 MG PO TABS
50.0000 mg | ORAL_TABLET | Freq: Every day | ORAL | 0 refills | Status: DC
Start: 2022-12-28 — End: 2023-02-01
  Filled 2022-12-28 – 2023-01-24 (×2): qty 30, 30d supply, fill #0

## 2022-12-28 MED ORDER — SERTRALINE HCL 100 MG PO TABS
100.0000 mg | ORAL_TABLET | Freq: Every day | ORAL | 0 refills | Status: DC
Start: 2022-12-28 — End: 2023-02-01
  Filled 2022-12-28 – 2023-01-24 (×2): qty 30, 30d supply, fill #0

## 2022-12-28 MED ORDER — TRAZODONE HCL 150 MG PO TABS
150.0000 mg | ORAL_TABLET | Freq: Every day | ORAL | 0 refills | Status: DC
Start: 2022-12-28 — End: 2023-02-01
  Filled 2022-12-28 – 2023-01-24 (×2): qty 30, 30d supply, fill #0
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
## 2022-12-29 ENCOUNTER — Other Ambulatory Visit: Payer: Self-pay

## 2023-01-24 ENCOUNTER — Other Ambulatory Visit: Payer: Self-pay

## 2023-01-25 ENCOUNTER — Ambulatory Visit: Payer: Self-pay

## 2023-02-01 ENCOUNTER — Ambulatory Visit: Payer: Self-pay | Admitting: Gerontology

## 2023-02-01 ENCOUNTER — Other Ambulatory Visit: Payer: Self-pay

## 2023-02-01 DIAGNOSIS — G4700 Insomnia, unspecified: Secondary | ICD-10-CM

## 2023-02-01 DIAGNOSIS — F332 Major depressive disorder, recurrent severe without psychotic features: Secondary | ICD-10-CM

## 2023-02-01 MED ORDER — SERTRALINE HCL 50 MG PO TABS
50.0000 mg | ORAL_TABLET | Freq: Every day | ORAL | 0 refills | Status: DC
Start: 2023-02-01 — End: 2023-03-27
  Filled 2023-02-01 – 2023-02-19 (×2): qty 30, 30d supply, fill #0

## 2023-02-01 MED ORDER — SERTRALINE HCL 100 MG PO TABS
100.0000 mg | ORAL_TABLET | Freq: Every day | ORAL | 0 refills | Status: DC
Start: 2023-02-01 — End: 2023-03-27
  Filled 2023-02-01 – 2023-02-19 (×2): qty 30, 30d supply, fill #0

## 2023-02-01 MED ORDER — TRAZODONE HCL 150 MG PO TABS
150.0000 mg | ORAL_TABLET | Freq: Every day | ORAL | 0 refills | Status: DC
Start: 2023-02-01 — End: 2023-03-27
  Filled 2023-02-01 – 2023-02-19 (×2): qty 30, 30d supply, fill #0
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
## 2023-02-02 ENCOUNTER — Other Ambulatory Visit: Payer: Self-pay

## 2023-02-19 ENCOUNTER — Other Ambulatory Visit: Payer: Self-pay

## 2023-03-01 ENCOUNTER — Telehealth: Payer: Self-pay

## 2023-03-27 ENCOUNTER — Other Ambulatory Visit: Payer: Self-pay | Admitting: Gerontology

## 2023-03-27 ENCOUNTER — Other Ambulatory Visit: Payer: Self-pay

## 2023-03-27 DIAGNOSIS — G4700 Insomnia, unspecified: Secondary | ICD-10-CM

## 2023-03-27 DIAGNOSIS — F332 Major depressive disorder, recurrent severe without psychotic features: Secondary | ICD-10-CM

## 2023-03-27 MED ORDER — SERTRALINE HCL 50 MG PO TABS
50.0000 mg | ORAL_TABLET | Freq: Every day | ORAL | 0 refills | Status: DC
Start: 2023-03-27 — End: 2023-04-25
  Filled 2023-03-27: qty 30, 30d supply, fill #0

## 2023-03-27 MED ORDER — TRAZODONE HCL 150 MG PO TABS
150.0000 mg | ORAL_TABLET | Freq: Every day | ORAL | 0 refills | Status: DC
  Filled 2023-03-27: qty 30, 30d supply, fill #0

## 2023-03-27 MED ORDER — SERTRALINE HCL 100 MG PO TABS
100.0000 mg | ORAL_TABLET | Freq: Every day | ORAL | 0 refills | Status: DC
Start: 2023-03-27 — End: 2023-04-25
  Filled 2023-03-27: qty 30, 30d supply, fill #0

## 2023-04-18 ENCOUNTER — Ambulatory Visit: Payer: Self-pay | Admitting: Gerontology

## 2023-04-24 ENCOUNTER — Other Ambulatory Visit: Payer: Self-pay

## 2023-04-24 ENCOUNTER — Other Ambulatory Visit: Payer: Self-pay | Admitting: Gerontology

## 2023-04-24 DIAGNOSIS — F332 Major depressive disorder, recurrent severe without psychotic features: Secondary | ICD-10-CM

## 2023-04-24 DIAGNOSIS — G4700 Insomnia, unspecified: Secondary | ICD-10-CM

## 2023-04-25 ENCOUNTER — Other Ambulatory Visit: Payer: Self-pay

## 2023-04-25 ENCOUNTER — Other Ambulatory Visit (HOSPITAL_BASED_OUTPATIENT_CLINIC_OR_DEPARTMENT_OTHER): Payer: Self-pay

## 2023-04-25 MED FILL — Sertraline HCl Tab 50 MG: ORAL | 30 days supply | Qty: 30 | Fill #0 | Status: CN

## 2023-04-25 MED FILL — Sertraline HCl Tab 100 MG: ORAL | 30 days supply | Qty: 30 | Fill #0 | Status: CN

## 2023-04-25 MED FILL — Trazodone HCl Tab 150 MG: ORAL | 30 days supply | Qty: 30 | Fill #0 | Status: CN

## 2023-04-27 ENCOUNTER — Other Ambulatory Visit: Payer: Self-pay

## 2023-04-27 MED FILL — Trazodone HCl Tab 100 MG: ORAL | 30 days supply | Qty: 45 | Fill #0 | Status: AC

## 2023-04-27 MED FILL — Sertraline HCl Tab 100 MG: ORAL | 30 days supply | Qty: 30 | Fill #0 | Status: AC

## 2023-04-27 MED FILL — Sertraline HCl Tab 50 MG: ORAL | 30 days supply | Qty: 30 | Fill #0 | Status: AC

## 2023-05-02 ENCOUNTER — Encounter: Payer: Self-pay | Admitting: Gerontology

## 2023-05-02 ENCOUNTER — Other Ambulatory Visit: Payer: Self-pay

## 2023-05-02 ENCOUNTER — Ambulatory Visit: Payer: Self-pay | Admitting: Gerontology

## 2023-05-02 VITALS — BP 113/72 | HR 92 | Wt 215.8 lb

## 2023-05-02 DIAGNOSIS — F332 Major depressive disorder, recurrent severe without psychotic features: Secondary | ICD-10-CM

## 2023-05-02 DIAGNOSIS — M1A09X0 Idiopathic chronic gout, multiple sites, without tophus (tophi): Secondary | ICD-10-CM

## 2023-05-02 DIAGNOSIS — R200 Anesthesia of skin: Secondary | ICD-10-CM | POA: Insufficient documentation

## 2023-05-02 DIAGNOSIS — G4700 Insomnia, unspecified: Secondary | ICD-10-CM

## 2023-05-02 DIAGNOSIS — R635 Abnormal weight gain: Secondary | ICD-10-CM | POA: Insufficient documentation

## 2023-05-02 MED ORDER — SERTRALINE HCL 50 MG PO TABS
50.0000 mg | ORAL_TABLET | Freq: Every day | ORAL | 0 refills | Status: DC
  Filled 2023-05-02 – 2023-05-22 (×3): qty 30, 30d supply, fill #0

## 2023-05-02 MED ORDER — TRAZODONE HCL 100 MG PO TABS
150.0000 mg | ORAL_TABLET | Freq: Every day | ORAL | 0 refills | Status: DC
  Filled 2023-05-02 – 2023-05-22 (×2): qty 45, 30d supply, fill #0

## 2023-05-02 MED ORDER — SERTRALINE HCL 100 MG PO TABS
100.0000 mg | ORAL_TABLET | Freq: Every day | ORAL | 0 refills | Status: DC
  Filled 2023-05-02 – 2023-05-22 (×3): qty 30, 30d supply, fill #0

## 2023-05-02 MED ORDER — ALLOPURINOL 100 MG PO TABS
100.0000 mg | ORAL_TABLET | Freq: Two times a day (BID) | ORAL | 5 refills | Status: DC
  Filled 2023-05-02 – 2023-05-22 (×2): qty 60, 30d supply, fill #0
  Filled 2023-06-08 – 2023-06-21 (×3): qty 60, 30d supply, fill #1
  Filled 2023-07-19: qty 60, 30d supply, fill #2
  Filled 2023-08-26: qty 60, 30d supply, fill #3
  Filled 2023-09-12 – 2023-09-25 (×2): qty 60, 30d supply, fill #4
  Filled 2023-10-26: qty 60, 30d supply, fill #5

## 2023-05-02 NOTE — Patient Instructions (Signed)
Calorie Counting for Weight Loss Calories are units of energy. Your body needs a certain number of calories from food to keep going throughout the day. When you eat or drink more calories than your body needs, your body stores the extra calories mostly as fat. When you eat or drink fewer calories than your body needs, your body burns fat to get the energy it needs. Calorie counting means keeping track of how many calories you eat and drink each day. Calorie counting can be helpful if you need to lose weight. If you eat fewer calories than your body needs, you should lose weight. Ask your health care provider what a healthy weight is for you. For calorie counting to work, you will need to eat the right number of calories each day to lose a healthy amount of weight per week. A dietitian can help you figure out how many calories you need in a day and will suggest ways to reach your calorie goal. A healthy amount of weight to lose each week is usually 1-2 lb (0.5-0.9 kg). This usually means that your daily calorie intake should be reduced by 500-750 calories. Eating 1,200-1,500 calories a day can help most women lose weight. Eating 1,500-1,800 calories a day can help most men lose weight. What do I need to know about calorie counting? Work with your health care provider or dietitian to determine how many calories you should get each day. To meet your daily calorie goal, you will need to: Find out how many calories are in each food that you would like to eat. Try to do this before you eat. Decide how much of the food you plan to eat. Keep a food log. Do this by writing down what you ate and how many calories it had. To successfully lose weight, it is important to balance calorie counting with a healthy lifestyle that includes regular activity. Where do I find calorie information?  The number of calories in a food can be found on a Nutrition Facts label. If a food does not have a Nutrition Facts label, try  to look up the calories online or ask your dietitian for help. Remember that calories are listed per serving. If you choose to have more than one serving of a food, you will have to multiply the calories per serving by the number of servings you plan to eat. For example, the label on a package of bread might say that a serving size is 1 slice and that there are 90 calories in a serving. If you eat 1 slice, you will have eaten 90 calories. If you eat 2 slices, you will have eaten 180 calories. How do I keep a food log? After each time that you eat, record the following in your food log as soon as possible: What you ate. Be sure to include toppings, sauces, and other extras on the food. How much you ate. This can be measured in cups, ounces, or number of items. How many calories were in each food and drink. The total number of calories in the food you ate. Keep your food log near you, such as in a pocket-sized notebook or on an app or website on your mobile phone. Some programs will calculate calories for you and show you how many calories you have left to meet your daily goal. What are some portion-control tips? Know how many calories are in a serving. This will help you know how many servings you can have of a certain   food. Use a measuring cup to measure serving sizes. You could also try weighing out portions on a kitchen scale. With time, you will be able to estimate serving sizes for some foods. Take time to put servings of different foods on your favorite plates or in your favorite bowls and cups so you know what a serving looks like. Try not to eat straight from a food's packaging, such as from a bag or box. Eating straight from the package makes it hard to see how much you are eating and can lead to overeating. Put the amount you would like to eat in a cup or on a plate to make sure you are eating the right portion. Use smaller plates, glasses, and bowls for smaller portions and to prevent  overeating. Try not to multitask. For example, avoid watching TV or using your computer while eating. If it is time to eat, sit down at a table and enjoy your food. This will help you recognize when you are full. It will also help you be more mindful of what and how much you are eating. What are tips for following this plan? Reading food labels Check the calorie count compared with the serving size. The serving size may be smaller than what you are used to eating. Check the source of the calories. Try to choose foods that are high in protein, fiber, and vitamins, and low in saturated fat, trans fat, and sodium. Shopping Read nutrition labels while you shop. This will help you make healthy decisions about which foods to buy. Pay attention to nutrition labels for low-fat or fat-free foods. These foods sometimes have the same number of calories or more calories than the full-fat versions. They also often have added sugar, starch, or salt to make up for flavor that was removed with the fat. Make a grocery list of lower-calorie foods and stick to it. Cooking Try to cook your favorite foods in a healthier way. For example, try baking instead of frying. Use low-fat dairy products. Meal planning Use more fruits and vegetables. One-half of your plate should be fruits and vegetables. Include lean proteins, such as chicken, turkey, and fish. Lifestyle Each week, aim to do one of the following: 150 minutes of moderate exercise, such as walking. 75 minutes of vigorous exercise, such as running. General information Know how many calories are in the foods you eat most often. This will help you calculate calorie counts faster. Find a way of tracking calories that works for you. Get creative. Try different apps or programs if writing down calories does not work for you. What foods should I eat?  Eat nutritious foods. It is better to have a nutritious, high-calorie food, such as an avocado, than a food with  few nutrients, such as a bag of potato chips. Use your calories on foods and drinks that will fill you up and will not leave you hungry soon after eating. Examples of foods that fill you up are nuts and nut butters, vegetables, lean proteins, and high-fiber foods such as whole grains. High-fiber foods are foods with more than 5 g of fiber per serving. Pay attention to calories in drinks. Low-calorie drinks include water and unsweetened drinks. The items listed above may not be a complete list of foods and beverages you can eat. Contact a dietitian for more information. What foods should I limit? Limit foods or drinks that are not good sources of vitamins, minerals, or protein or that are high in unhealthy fats. These   include: Candy. Other sweets. Sodas, specialty coffee drinks, alcohol, and juice. The items listed above may not be a complete list of foods and beverages you should avoid. Contact a dietitian for more information. How do I count calories when eating out? Pay attention to portions. Often, portions are much larger when eating out. Try these tips to keep portions smaller: Consider sharing a meal instead of getting your own. If you get your own meal, eat only half of it. Before you start eating, ask for a container and put half of your meal into it. When available, consider ordering smaller portions from the menu instead of full portions. Pay attention to your food and drink choices. Knowing the way food is cooked and what is included with the meal can help you eat fewer calories. If calories are listed on the menu, choose the lower-calorie options. Choose dishes that include vegetables, fruits, whole grains, low-fat dairy products, and lean proteins. Choose items that are boiled, broiled, grilled, or steamed. Avoid items that are buttered, battered, fried, or served with cream sauce. Items labeled as crispy are usually fried, unless stated otherwise. Choose water, low-fat milk,  unsweetened iced tea, or other drinks without added sugar. If you want an alcoholic beverage, choose a lower-calorie option, such as a glass of wine or light beer. Ask for dressings, sauces, and syrups on the side. These are usually high in calories, so you should limit the amount you eat. If you want a salad, choose a garden salad and ask for grilled meats. Avoid extra toppings such as bacon, cheese, or fried items. Ask for the dressing on the side, or ask for olive oil and vinegar or lemon to use as dressing. Estimate how many servings of a food you are given. Knowing serving sizes will help you be aware of how much food you are eating at restaurants. Where to find more information Centers for Disease Control and Prevention: www.cdc.gov [place] Department of Agriculture: myplate.gov Summary Calorie counting means keeping track of how many calories you eat and drink each day. If you eat fewer calories than your body needs, you should lose weight. A healthy amount of weight to lose per week is usually 1-2 lb (0.5-0.9 kg). This usually means reducing your daily calorie intake by 500-750 calories. The number of calories in a food can be found on a Nutrition Facts label. If a food does not have a Nutrition Facts label, try to look up the calories online or ask your dietitian for help. Use smaller plates, glasses, and bowls for smaller portions and to prevent overeating. Use your calories on foods and drinks that will fill you up and not leave you hungry shortly after a meal. This information is not intended to replace advice given to you by your health care provider. Make sure you discuss any questions you have with your health care provider. Document Revised: 07/10/2019 Document Reviewed: 07/10/2019 Elsevier Patient Education  2023 Elsevier Inc.  

## 2023-05-02 NOTE — Progress Notes (Unsigned)
Established Patient Office Visit  Subjective   Patient ID: Latoya Benson, female    DOB: July 29, 1995  Age: 27 y.o. MRN: [id number]  No chief complaint on file.   HPI  Latoya Benson is a 27 year old female who has a history of depression, gout, polycystic ovarian syndrome who presents for follow up visit and medication refill. She  c/o intermittent numbnes to in between her right index and middle finger that started few months ago. She states that the numbing episode occurs daily, especially when engaging in repetitive activities such as writing or washing dishes. She states that shaking her hand resolved symptoms. She denies muscle nor motor weakness. She states that her mood is good, denies suicidal nor homicidal ideation. Overall, she states that she's doing well and offers no further complaint. PHQ-9= 0 GAD-7= 2  Review of Systems  Constitutional: Negative.   Respiratory: Negative.    Cardiovascular: Negative.   Neurological: Negative.   Psychiatric/Behavioral: Negative.        Objective:     BP 113/72 (BP Location: Right Arm, Patient Position: Sitting, Cuff Size: Large)   Pulse 92   Wt 215 lb 12.8 oz (97.9 kg)   BMI 39.47 kg/m  BP Readings from Last 3 Encounters:  05/02/23 113/72  09/14/22 122/83  08/24/22 99/71   Wt Readings from Last 3 Encounters:  05/02/23 215 lb 12.8 oz (97.9 kg)  09/14/22 207 lb 9.6 oz (94.2 kg)  08/24/22 206 lb (93.4 kg)      Physical Exam HENT:     Head: Normocephalic and atraumatic.  Cardiovascular:     Rate and Rhythm: Normal rate and regular rhythm.     Pulses: Normal pulses.     Heart sounds: Normal heart sounds.  Pulmonary:     Effort: Pulmonary effort is normal.     Breath sounds: Normal breath sounds.  Skin:    General: Skin is warm.  Neurological:     General: No focal deficit present.     Mental Status: She is alert and oriented to person, place, and time.  Psychiatric:        Mood and Affect: Mood normal.         Behavior: Behavior normal.        Thought Content: Thought content normal.        Judgment: Judgment normal.      No results found for any visits on 05/02/23.  Last CBC Lab Results  Component Value Date   WBC 7.4 08/09/2022   HGB 12.3 08/09/2022   HCT 38.0 08/09/2022   MCV 92 08/09/2022   MCH 29.6 08/09/2022   RDW 13.4 08/09/2022   PLT 464 (H) 08/09/2022   Last metabolic panel Lab Results  Component Value Date   GLUCOSE 81 08/09/2022   NA 142 08/09/2022   K 4.4 08/09/2022   CL 106 08/09/2022   CO2 18 (L) 08/09/2022   BUN 7 08/09/2022   CREATININE 0.78 08/09/2022   EGFR 106 08/09/2022   CALCIUM 9.7 08/09/2022   PROT 6.7 08/09/2022   ALBUMIN 4.1 08/09/2022   LABGLOB 2.6 08/09/2022   AGRATIO 1.6 08/09/2022   BILITOT 0.2 08/09/2022   ALKPHOS 56 08/09/2022   AST 12 08/09/2022   ALT 14 08/09/2022   ANIONGAP 9 07/27/2021   Last lipids Lab Results  Component Value Date   CHOL 194 08/09/2022   HDL 34 (L) 08/09/2022   LDLCALC 95 08/09/2022   TRIG 392 (H) 08/09/2022   CHOLHDL 5.7 (  H) 08/09/2022   Last hemoglobin A1c Lab Results  Component Value Date   HGBA1C 5.6 08/09/2022   Last thyroid functions Lab Results  Component Value Date   TSH 1.760 08/09/2022   Last vitamin D Lab Results  Component Value Date   VD25OH 45.0 04/19/2022   Last vitamin B12 and Folate Lab Results  Component Value Date   VITAMINB12 982 04/19/2022   FOLATE 9.7 10/27/2020      The ASCVD Risk score (Arnett DK, et al., 2019) failed to calculate for the following reasons:   The 2019 ASCVD risk score is only valid for ages 4 to 74    Assessment & Plan:   1. Chronic gout of multiple sites, unspecified cause - Her gout is under control, she will continue current medication - allopurinol (ZYLOPRIM) 100 MG tablet; Take 1 tablet (100 mg total) by mouth 2 (two) times daily.  Dispense: 60 tablet; Refill: 5  2. Severe episode of recurrent major depressive disorder, without psychotic  features (HCC) - Her depression is under control, will continue current medication, advised to call the Crisis help line for worsening symptoms. - sertraline (ZOLOFT) 100 MG tablet; Take 1 tablet (100 mg total) by mouth once daily.  Dispense: 30 tablet; Refill: 0 - sertraline (ZOLOFT) 50 MG tablet; Take 1 tablet (50 mg total) by mouth daily.  Dispense: 30 tablet; Refill: 0  3. Insomnia, unspecified type - Her sleep has improved, will continue current medication. - traZODone (DESYREL) 100 MG tablet; Take 1.5 tablets (150 mg total) by mouth at bedtime.  Dispense: 45 tablet; Refill: 0  4. Numbness - Unknown etiology of intermittent numbness, advised to monitor , notify clinic and go to the ED for worsening symptoms. - HgB A1c; Future - B12 and Folate Panel; Future - Vitamin D (25 hydroxy); Future  5. Weight gain - She was encouraged to continue on calorie count, exercise as tolerated. - TSH; Future  Return in about 2 weeks (around 05/16/2023), or if symptoms worsen or fail to improve.    Jasma Seevers Trellis Paganini, NP

## 2023-05-03 ENCOUNTER — Other Ambulatory Visit: Payer: Self-pay

## 2023-05-03 DIAGNOSIS — R200 Anesthesia of skin: Secondary | ICD-10-CM

## 2023-05-03 DIAGNOSIS — R635 Abnormal weight gain: Secondary | ICD-10-CM

## 2023-05-05 LAB — B12 AND FOLATE PANEL
Folate: 17 ng/mL (ref >3.0)
Vitamin B-12: 1817 pg/mL — ABNORMAL HIGH (ref 232–1245)

## 2023-05-05 LAB — VITAMIN D 25 HYDROXY (VIT D DEFICIENCY, FRACTURES): Vit D, 25-Hydroxy: 51 ng/mL (ref 30.0–100.0)

## 2023-05-05 LAB — HEMOGLOBIN A1C
Est. average glucose Bld gHb Est-mCnc: 120 mg/dL
Hgb A1c MFr Bld: 5.8 % — ABNORMAL HIGH (ref 4.8–5.6)

## 2023-05-05 LAB — TSH: TSH: 2.2 u[IU]/mL (ref 0.450–4.500)

## 2023-05-16 ENCOUNTER — Ambulatory Visit: Payer: Self-pay | Admitting: Gerontology

## 2023-05-16 DIAGNOSIS — R200 Anesthesia of skin: Secondary | ICD-10-CM

## 2023-05-16 DIAGNOSIS — R7303 Prediabetes: Secondary | ICD-10-CM

## 2023-05-16 DIAGNOSIS — R7989 Other specified abnormal findings of blood chemistry: Secondary | ICD-10-CM | POA: Insufficient documentation

## 2023-05-16 NOTE — Progress Notes (Signed)
Established Patient Office Visit  Subjective   Patient ID: Latoya Benson, female    DOB: [birth year]  Age: 27 y.o. MRN: [id number]  No chief complaint on file.  Patient consented to telephone visit and 2 patient identifiers was used to identify patient.  HPI  Latoya Benson is a 27 year old female who has a history of depression, gout, polycystic ovarian syndrome who presents for follow up visit an lab review. Her HgbA1c checked 05/03/23, was 5.8%,and Vitamin B12 was 1,817 pg/ml and the rest were within normal limit. She also denies any numbness to finger since last visit. She states that her mood is good, denies suicidal nor homicidal ideation. Overall, she states that she's doing well and offers no further complaint.  Review of Systems  Constitutional: Negative.   Respiratory: Negative.    Cardiovascular: Negative.   Neurological: Negative.   Psychiatric/Behavioral: Negative.        Objective:     There were no vitals taken for this visit. BP Readings from Last 3 Encounters:  05/02/23 113/72  09/14/22 122/83  08/24/22 99/71   Wt Readings from Last 3 Encounters:  05/02/23 215 lb 12.8 oz (97.9 kg)  09/14/22 207 lb 9.6 oz (94.2 kg)  08/24/22 206 lb (93.4 kg)      Physical Exam   No results found for any visits on 05/16/23.  Last CBC Lab Results  Component Value Date   WBC 7.4 08/09/2022   HGB 12.3 08/09/2022   HCT 38.0 08/09/2022   MCV 92 08/09/2022   MCH 29.6 08/09/2022   RDW 13.4 08/09/2022   PLT 464 (H) 08/09/2022   Last metabolic panel Lab Results  Component Value Date   GLUCOSE 81 08/09/2022   NA 142 08/09/2022   K 4.4 08/09/2022   CL 106 08/09/2022   CO2 18 (L) 08/09/2022   BUN 7 08/09/2022   CREATININE 0.78 08/09/2022   EGFR 106 08/09/2022   CALCIUM 9.7 08/09/2022   PROT 6.7 08/09/2022   ALBUMIN 4.1 08/09/2022   LABGLOB 2.6 08/09/2022   AGRATIO 1.6 08/09/2022   BILITOT 0.2 08/09/2022   ALKPHOS 56 08/09/2022   AST 12 08/09/2022    ALT 14 08/09/2022   ANIONGAP 9 07/27/2021   Last lipids Lab Results  Component Value Date   CHOL 194 08/09/2022   HDL 34 (L) 08/09/2022   LDLCALC 95 08/09/2022   TRIG 392 (H) 08/09/2022   CHOLHDL 5.7 (H) 08/09/2022   Last hemoglobin A1c Lab Results  Component Value Date   HGBA1C 5.8 (H) 05/03/2023   Last thyroid functions Lab Results  Component Value Date   TSH 2.200 05/03/2023   Last vitamin D Lab Results  Component Value Date   VD25OH 51.0 05/03/2023   Last vitamin B12 and Folate Lab Results  Component Value Date   VITAMINB12 1,817 (H) 05/03/2023   FOLATE 17.0 05/03/2023      The ASCVD Risk score (Arnett DK, et al., 2019) failed to calculate for the following reasons:   The 2019 ASCVD risk score is only valid for ages 14 to 74    Assessment & Plan:   1. Numbness - Numbness to finger has improved, advised to continue to monitor and notify clinic for worsening symptoms.  2. Prediabetes - Her HgbA1c was 5.8%, she was advised to continue on low carb/non concentrated sweet diet and exercise as tolerated.  3. High serum vitamin B12 - She was advised to discontinue otc Vitamin B 12 supplement, will recheck in few  weeks.   Return in about 5 weeks (around 06/20/2023), or if symptoms worsen or fail to improve.    Juliann Olesky Trellis Paganini, NP

## 2023-05-22 ENCOUNTER — Other Ambulatory Visit: Payer: Self-pay

## 2023-06-08 ENCOUNTER — Other Ambulatory Visit: Payer: Self-pay | Admitting: Gerontology

## 2023-06-08 ENCOUNTER — Other Ambulatory Visit: Payer: Self-pay

## 2023-06-08 DIAGNOSIS — G4700 Insomnia, unspecified: Secondary | ICD-10-CM

## 2023-06-11 ENCOUNTER — Other Ambulatory Visit: Payer: Self-pay | Admitting: Gerontology

## 2023-06-11 ENCOUNTER — Other Ambulatory Visit: Payer: Self-pay

## 2023-06-11 DIAGNOSIS — G4700 Insomnia, unspecified: Secondary | ICD-10-CM

## 2023-06-12 ENCOUNTER — Other Ambulatory Visit: Payer: Self-pay

## 2023-06-12 MED FILL — Trazodone HCl Tab 100 MG: ORAL | 30 days supply | Qty: 45 | Fill #0 | Status: CN

## 2023-06-20 ENCOUNTER — Other Ambulatory Visit: Payer: Self-pay | Admitting: Gerontology

## 2023-06-20 ENCOUNTER — Other Ambulatory Visit: Payer: Self-pay

## 2023-06-20 ENCOUNTER — Ambulatory Visit: Payer: Self-pay | Admitting: Gerontology

## 2023-06-20 DIAGNOSIS — F332 Major depressive disorder, recurrent severe without psychotic features: Secondary | ICD-10-CM

## 2023-06-20 DIAGNOSIS — G4700 Insomnia, unspecified: Secondary | ICD-10-CM

## 2023-06-20 MED ORDER — SERTRALINE HCL 100 MG PO TABS
100.0000 mg | ORAL_TABLET | Freq: Every day | ORAL | 0 refills | Status: DC
  Filled 2023-06-20 – 2023-08-17 (×3): qty 30, 30d supply, fill #0

## 2023-06-20 MED ORDER — SERTRALINE HCL 50 MG PO TABS
50.0000 mg | ORAL_TABLET | Freq: Every day | ORAL | 0 refills | Status: DC
  Filled 2023-06-20 – 2023-08-17 (×3): qty 30, 30d supply, fill #0

## 2023-06-20 MED ORDER — TRAZODONE HCL 100 MG PO TABS
150.0000 mg | ORAL_TABLET | Freq: Every day | ORAL | 0 refills | Status: DC
  Filled 2023-06-21: qty 45, 30d supply, fill #0

## 2023-06-20 MED FILL — Trazodone HCl Tab 100 MG: ORAL | 12 days supply | Qty: 17 | Fill #0 | Status: CN

## 2023-06-20 MED FILL — Trazodone HCl Tab 100 MG: ORAL | 18 days supply | Qty: 28 | Fill #0 | Status: CN

## 2023-06-20 NOTE — Progress Notes (Signed)
 Established Patient Office Visit  Subjective   Patient ID: Latoya Benson, female    DOB: 05-11-1996  Age: 28 y.o. MRN: [id number]  No chief complaint on file. Patient consented to telephone visit and 2 patient identifiers was used to identify patient.   HPI  Latoya Benson is a 28 year old female who has a history of depression, gout, polycystic ovarian syndrome who presents for follow up visit. She states that she's compliant with her medications, denies side effects and continues to make healthy lifestyle changes. She states that her mood is good, denies suicidal nor homicidal ideation. Overall, she states that she's doing well and offers no further complaint.  PHQ-9 =0 GAD-7= 0  Review of Systems  Constitutional: Negative.   Eyes: Negative.   Respiratory: Negative.    Cardiovascular: Negative.   Neurological: Negative.   Psychiatric/Behavioral: Negative.        Objective:     There were no vitals taken for this visit. BP Readings from Last 3 Encounters:  05/02/23 113/72  09/14/22 122/83  08/24/22 99/71   Wt Readings from Last 3 Encounters:  05/02/23 215 lb 12.8 oz (97.9 kg)  09/14/22 207 lb 9.6 oz (94.2 kg)  08/24/22 206 lb (93.4 kg)      Physical Exam   No results found for any visits on 06/20/23.  Last CBC Lab Results  Component Value Date   WBC 7.4 08/09/2022   HGB 12.3 08/09/2022   HCT 38.0 08/09/2022   MCV 92 08/09/2022   MCH 29.6 08/09/2022   RDW 13.4 08/09/2022   PLT 464 (H) 08/09/2022   Last metabolic panel Lab Results  Component Value Date   GLUCOSE 81 08/09/2022   NA 142 08/09/2022   K 4.4 08/09/2022   CL 106 08/09/2022   CO2 18 (L) 08/09/2022   BUN 7 08/09/2022   CREATININE 0.78 08/09/2022   EGFR 106 08/09/2022   CALCIUM 9.7 08/09/2022   PROT 6.7 08/09/2022   ALBUMIN 4.1 08/09/2022   LABGLOB 2.6 08/09/2022   AGRATIO 1.6 08/09/2022   BILITOT 0.2 08/09/2022   ALKPHOS 56 08/09/2022   AST 12 08/09/2022   ALT 14 08/09/2022    ANIONGAP 9 07/27/2021   Last lipids Lab Results  Component Value Date   CHOL 194 08/09/2022   HDL 34 (L) 08/09/2022   LDLCALC 95 08/09/2022   TRIG 392 (H) 08/09/2022   CHOLHDL 5.7 (H) 08/09/2022   Last hemoglobin A1c Lab Results  Component Value Date   HGBA1C 5.8 (H) 05/03/2023   Last thyroid functions Lab Results  Component Value Date   TSH 2.200 05/03/2023   Last vitamin D  Lab Results  Component Value Date   VD25OH 51.0 05/03/2023   Last vitamin B12 and Folate Lab Results  Component Value Date   VITAMINB12 1,817 (H) 05/03/2023   FOLATE 17.0 05/03/2023      The ASCVD Risk score (Arnett DK, et al., 2019) failed to calculate for the following reasons:   The 2019 ASCVD risk score is only valid for ages 61 to 66    Assessment & Plan:   1. Severe episode of recurrent major depressive disorder, without psychotic features (HCC) (Primary) - Her depression is under control,will continue current medication and will consult with Dr Lavine. - sertraline  (ZOLOFT ) 100 MG tablet; Take 1 tablet (100 mg total) by mouth once daily.  Dispense: 30 tablet; Refill: 0 - sertraline  (ZOLOFT ) 50 MG tablet; Take 1 tablet (50 mg total) by mouth daily.  Dispense:  30 tablet; Refill: 0  2. Insomnia, unspecified type - Her insomnia is under control, will continue current medication and will consult with Dr Lavine. - traZODone  (DESYREL ) 100 MG tablet; Take 1.5 tablets (150 mg total) by mouth at bedtime.  Dispense: 45 tablet; Refill: 0   Return in about 4 weeks (around 07/18/2023), or if symptoms worsen or fail to improve.    Wilian Kwong E Tawna Alwin, NP

## 2023-06-21 ENCOUNTER — Other Ambulatory Visit: Payer: Self-pay

## 2023-06-26 ENCOUNTER — Telehealth: Payer: Self-pay | Admitting: Physician Assistant

## 2023-06-26 DIAGNOSIS — R6889 Other general symptoms and signs: Secondary | ICD-10-CM

## 2023-06-26 MED ORDER — BENZONATATE 100 MG PO CAPS: 100.0000 mg | ORAL_CAPSULE | Freq: Three times a day (TID) | ORAL | 0 refills | Status: AC

## 2023-06-26 MED ORDER — FLUTICASONE PROPIONATE 50 MCG/ACT NA SUSP: 2.0000 | Freq: Every day | NASAL | 0 refills | Status: DC

## 2023-06-26 MED ORDER — OSELTAMIVIR PHOSPHATE 75 MG PO CAPS: 75.0000 mg | ORAL_CAPSULE | Freq: Two times a day (BID) | ORAL | 0 refills | Status: AC
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
## 2023-06-26 NOTE — Patient Instructions (Addendum)
 Corean LITTIE Agent, thank you for joining Lynden GORMAN Snuffer, PA-C for today's virtual visit.  While this provider is not your primary care provider (PCP), if your PCP is located in our provider database this encounter information will be shared with them immediately following your visit.   A [facility] MyChart account gives you access to today's visit and all your visits, tests, and labs performed at [facility]  click here if you don't have a [facility] MyChart account or go to [email or web address]  Consent: (Patient) Corean LITTIE Agent provided verbal consent for this virtual visit at the beginning of the encounter.  Current Medications:  Current Outpatient Medications:    allopurinol  (ZYLOPRIM ) 100 MG tablet, Take 1 tablet (100 mg total) by mouth 2 (two) times daily., Disp: 60 tablet, Rfl: 5   Cholecalciferol (VITAMIN D3 PO), Take 1 tablet by mouth daily., Disp: , Rfl:    Prenatal Vit-Fe Fumarate-FA (PRENATAL VITAMIN PO), Take 1 tablet by mouth daily., Disp: , Rfl:    sertraline  (ZOLOFT ) 100 MG tablet, Take 1 tablet (100 mg total) by mouth once daily., Disp: 30 tablet, Rfl: 0   sertraline  (ZOLOFT ) 50 MG tablet, Take 1 tablet (50 mg total) by mouth daily., Disp: 30 tablet, Rfl: 0   traZODone  (DESYREL ) 100 MG tablet, Take 1.5 tablets (150 mg total) by mouth at bedtime., Disp: 45 tablet, Rfl: 0   Medications ordered in this encounter:  No orders of the defined types were placed in this encounter.    *If you need refills on other medications prior to your next appointment, please contact your pharmacy*  Follow-Up: Call back or seek an in-person evaluation if the symptoms worsen or if the condition fails to improve as anticipated.  [facility] [phone]  Other Instructions You were given a prescription for Tamiflu .  Be aware that this medication may make you have nausea, vomiting, abdominal pain, or diarrhea.  This medication can also cause mood  derangement. If you experience any side effects that you cannot tolerate, then you should stop taking the medication. Please make sure to take stay hydrated while you are taking this medication.  You should follow up with your primary healthcare provider within the next 3-5 days for reevaluation.  You will need to return to the emergency department immediately if you experience any of the following symptoms:  Difficulty breathing or shortness or breath Pain or pressure in the chest or abdomen Sudden dizziness Confusion Severe or persistent vomiting Flu-like symptoms that improve but then return with fever or worse cough  You should stay home for at least 24 hours after your fever is gone except to get medical care or other necessities. Your fever should be gone without the need to use a fever-reducing medicine, such as Tylenol  or Motrin . Until then, you should stay home from work, school, travel, shopping, social events, and public gatherings.  Stay away from others as much as possible to keep from infecting them. If you must leave home, for example to get medical care, wear a facemask if you have one, or cover coughs and sneezes with a tissue. Wash your hands often to keep from spreading flu to others    If you have been instructed to have an in-person evaluation today at a local Urgent Care facility, please use the link below. It will take you to a list of all of our available [facility], including address, phone number and hours of operation. Please do not delay  care.  [facility]  If you or a family member do not have a primary care provider, use the link below to schedule a visit and establish care. When you choose a [facility] primary care physician or advanced practice provider, you gain a long-term partner in health. Find a Primary Care Provider  Learn more about [facility] in-office and virtual care options: [facility] Get Care Now

## 2023-07-18 ENCOUNTER — Ambulatory Visit: Payer: Self-pay | Admitting: Gerontology

## 2023-07-19 ENCOUNTER — Other Ambulatory Visit: Payer: Self-pay | Admitting: Gerontology

## 2023-07-19 ENCOUNTER — Other Ambulatory Visit: Payer: Self-pay

## 2023-07-19 ENCOUNTER — Ambulatory Visit: Payer: Self-pay | Admitting: Gerontology

## 2023-07-19 DIAGNOSIS — G4700 Insomnia, unspecified: Secondary | ICD-10-CM

## 2023-07-19 MED ORDER — TRAZODONE HCL 100 MG PO TABS
150.0000 mg | ORAL_TABLET | Freq: Every day | ORAL | 0 refills | Status: DC
  Filled 2023-07-19: qty 45, 30d supply, fill #0

## 2023-07-24 ENCOUNTER — Ambulatory Visit: Payer: Self-pay | Admitting: Gerontology

## 2023-07-24 DIAGNOSIS — Z8659 Personal history of other mental and behavioral disorders: Secondary | ICD-10-CM

## 2023-07-24 NOTE — Progress Notes (Unsigned)
   Established Patient Office Visit  Subjective   Patient ID: Latoya Benson, female    DOB: 27-Dec-1995  Age: 28 y.o. MRN: [id number]  No chief complaint on file. Patient consented to telephone visit and 2 patient identifiers was used to identify patient.     HPI  Latoya Benson is a 28 year old female who has a history of depression, gout, polycystic ovarian syndrome who presents for follow up visit.She states that she's compliant with her medications, denies side effects and continues to make healthy lifestyle changes. She states that her mood is good, denies suicidal nor homicidal ideation . She states that she's not on her birth control, states that she has not been on birth control since the Octopic pregnancy in August. She states that her last menstrual period was in November 28th, She denies any recent depressive episode and her last one was in August.  PHQ-9 = 0 GAD-7= 0  Review of Systems  Constitutional: Negative.   Respiratory: Negative.    Cardiovascular: Negative.   Neurological: Negative.   Psychiatric/Behavioral: Negative.        Objective:     There were no vitals taken for this visit. BP Readings from Last 3 Encounters:  05/02/23 113/72  09/14/22 122/83  08/24/22 99/71   Wt Readings from Last 3 Encounters:  05/02/23 215 lb 12.8 oz (97.9 kg)  09/14/22 207 lb 9.6 oz (94.2 kg)  08/24/22 206 lb (93.4 kg)      Physical Exam   No results found for any visits on 07/24/23.  Last CBC Lab Results  Component Value Date   WBC 7.4 08/09/2022   HGB 12.3 08/09/2022   HCT 38.0 08/09/2022   MCV 92 08/09/2022   MCH 29.6 08/09/2022   RDW 13.4 08/09/2022   PLT 464 (H) 08/09/2022   Last metabolic panel Lab Results  Component Value Date   GLUCOSE 81 08/09/2022   NA 142 08/09/2022   K 4.4 08/09/2022   CL 106 08/09/2022   CO2 18 (L) 08/09/2022   BUN 7 08/09/2022   CREATININE 0.78 08/09/2022   EGFR 106 08/09/2022   CALCIUM 9.7 08/09/2022   PROT 6.7  08/09/2022   ALBUMIN 4.1 08/09/2022   LABGLOB 2.6 08/09/2022   AGRATIO 1.6 08/09/2022   BILITOT 0.2 08/09/2022   ALKPHOS 56 08/09/2022   AST 12 08/09/2022   ALT 14 08/09/2022   ANIONGAP 9 07/27/2021   Last lipids Lab Results  Component Value Date   CHOL 194 08/09/2022   HDL 34 (L) 08/09/2022   LDLCALC 95 08/09/2022   TRIG 392 (H) 08/09/2022   CHOLHDL 5.7 (H) 08/09/2022   Last hemoglobin A1c Lab Results  Component Value Date   HGBA1C 5.8 (H) 05/03/2023   Last thyroid functions Lab Results  Component Value Date   TSH 2.200 05/03/2023   Last vitamin D Lab Results  Component Value Date   VD25OH 51.0 05/03/2023   Last vitamin B12 and Folate Lab Results  Component Value Date   VITAMINB12 1,817 (H) 05/03/2023   FOLATE 17.0 05/03/2023      The ASCVD Risk score (Arnett DK, et al., 2019) failed to calculate for the following reasons:   The 2019 ASCVD risk score is only valid for ages 45 to 60    Assessment & Plan:   Problem List Items Addressed This Visit   None   No follow-ups on file.    Nelva Hauk Trellis Paganini, NP

## 2023-07-25 NOTE — Progress Notes (Signed)
Ms. Brand notified writer when called stating that her home pregnancy test was negative. She was advised to use barrier method and will schedule a lab appointment for Pregnancy test at the clinic. She verbalized understanding.

## 2023-08-15 ENCOUNTER — Other Ambulatory Visit: Payer: Self-pay | Admitting: Gerontology

## 2023-08-15 DIAGNOSIS — Z8659 Personal history of other mental and behavioral disorders: Secondary | ICD-10-CM

## 2023-08-15 DIAGNOSIS — Z0000 Encounter for general adult medical examination without abnormal findings: Secondary | ICD-10-CM

## 2023-08-16 ENCOUNTER — Other Ambulatory Visit: Payer: Self-pay

## 2023-08-16 DIAGNOSIS — Z8659 Personal history of other mental and behavioral disorders: Secondary | ICD-10-CM

## 2023-08-16 DIAGNOSIS — Z0000 Encounter for general adult medical examination without abnormal findings: Secondary | ICD-10-CM

## 2023-08-17 ENCOUNTER — Other Ambulatory Visit: Payer: Self-pay

## 2023-08-17 ENCOUNTER — Other Ambulatory Visit: Payer: Self-pay | Admitting: Gerontology

## 2023-08-17 DIAGNOSIS — G4700 Insomnia, unspecified: Secondary | ICD-10-CM

## 2023-08-18 LAB — LIPID PANEL
Chol/HDL Ratio: 6.5 ratio — ABNORMAL HIGH (ref 0.0–4.4)
Cholesterol, Total: 239 mg/dL — ABNORMAL HIGH (ref 100–199)
HDL: 37 mg/dL — ABNORMAL LOW (ref >39)
LDL Chol Calc (NIH): 119 mg/dL — ABNORMAL HIGH (ref 0–99)
Triglycerides: 469 mg/dL — ABNORMAL HIGH (ref 0–149)
VLDL Cholesterol Cal: 83 mg/dL — ABNORMAL HIGH (ref 5–40)

## 2023-08-18 LAB — B12 AND FOLATE PANEL
Folate: 10.7 ng/mL (ref >3.0)
Vitamin B-12: 656 pg/mL (ref 232–1245)

## 2023-08-18 LAB — PREGNANCY, URINE: Preg Test, Ur: NEGATIVE

## 2023-08-18 LAB — BETA HCG QUANT (REF LAB): hCG Quant: <1 m[IU]/mL

## 2023-08-20 ENCOUNTER — Other Ambulatory Visit: Payer: Self-pay | Admitting: Gerontology

## 2023-08-20 ENCOUNTER — Other Ambulatory Visit: Payer: Self-pay

## 2023-08-20 DIAGNOSIS — G4700 Insomnia, unspecified: Secondary | ICD-10-CM

## 2023-08-21 ENCOUNTER — Other Ambulatory Visit: Payer: Self-pay

## 2023-08-21 MED FILL — Trazodone HCl Tab 100 MG: ORAL | 30 days supply | Qty: 45 | Fill #0 | Status: AC

## 2023-08-22 ENCOUNTER — Other Ambulatory Visit: Payer: Self-pay

## 2023-08-26 ENCOUNTER — Other Ambulatory Visit: Payer: Self-pay

## 2023-08-27 ENCOUNTER — Telehealth: Payer: Self-pay | Admitting: Physician Assistant

## 2023-08-27 DIAGNOSIS — R6889 Other general symptoms and signs: Secondary | ICD-10-CM

## 2023-08-27 MED ORDER — OSELTAMIVIR PHOSPHATE 75 MG PO CAPS
75.0000 mg | ORAL_CAPSULE | Freq: Two times a day (BID) | ORAL | 0 refills | Status: AC
  Filled 2023-08-27: qty 10, 5d supply, fill #0

## 2023-08-27 MED ORDER — FLUTICASONE PROPIONATE 50 MCG/ACT NA SUSP
2.0000 | Freq: Every day | NASAL | 1 refills | Status: DC
  Filled 2023-08-27: qty 16, 30d supply, fill #0

## 2023-08-27 NOTE — Progress Notes (Signed)
 Virtual Visit Consent   Latoya Benson, you are scheduled for a virtual visit with a [facility] provider today. Just as with appointments in the office, your consent must be obtained to participate. Your consent will be active for this visit and any virtual visit you may have with one of our providers in the next 365 days. If you have a MyChart account, a copy of this consent can be sent to you electronically.  As this is a virtual visit, video technology does not allow for your provider to perform a traditional examination. This may limit your provider's ability to fully assess your condition. If your provider identifies any concerns that need to be evaluated in person or the need to arrange testing (such as labs, EKG, etc.), we will make arrangements to do so. Although advances in technology are sophisticated, we cannot ensure that it will always work on either your end or our end. If the connection with a video visit is poor, the visit may have to be switched to a telephone visit. With either a video or telephone visit, we are not always able to ensure that we have a secure connection.  By engaging in this virtual visit, you consent to the provision of healthcare and authorize for your insurance to be billed (if applicable) for the services provided during this visit. Depending on your insurance coverage, you may receive a charge related to this service.  I need to obtain your verbal consent now. Are you willing to proceed with your visit today? Latoya Benson has provided verbal consent on 08/27/2023 for a virtual visit (video or telephone). Gilberto Better, [place]  Date: 08/27/2023 6:07 PM   Virtual Visit via Video Note   I, Tyreque Finken, connected with  Latoya Benson  ([id number], Oct 28, 1995) on 08/27/23 at  6:00 PM EDT by a video-enabled telemedicine application and verified that I am speaking with the correct person using two identifiers.  Location: Patient: Virtual Visit Location  Patient: Home Provider: Virtual Visit Location Provider: Home Office   I discussed the limitations of evaluation and management by telemedicine and the availability of in person appointments. The patient expressed understanding and agreed to proceed.    History of Present Illness: Latoya Benson is a 28 y.o. who identifies as a female who was assigned female at birth, and is being seen today for flu like illness.  HPI: 28 y/o F presents via video telehealth visit for c/o nasal congestion, headache, fever with max temp of 100.9 degrees since yesterday. 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 She took Mucinex without much relief.   URI  Associated symptoms include congestion.    Problems:  Patient Active Problem List   Diagnosis Date Noted   High serum vitamin B12 05/16/2023   Numbness 05/02/2023   Weight gain 05/02/2023   Nausea & vomiting 09/14/2022   Headache 04/19/2022   Follow-up exam 08/04/2021   Elevated alanine aminotransferase (ALT) level 11/24/2020   Proteinuria 11/24/2020   Diarrhea 11/24/2020   Vitamin D deficiency 07/29/2020   Low vitamin B12 level 07/29/2020   Thrombocytosis 07/29/2020   Numbness and tingling in both hands 07/07/2020   Insomnia 07/07/2020   History of PCOS 05/05/2020   Prediabetes 05/05/2020   Myopia of both eyes 05/05/2020   History of depression 05/05/2020   ASCUS of cervix with negative high risk HPV 08/15/2019   Class 2 obesity with body mass index (BMI) of 38.0 to 38.9 in adult 08/07/2019  Pyelonephritis 07/31/2017   Sepsis (HCC) 04/02/2016   Need for varicella vaccine 07/27/2014   Obesity in pregnancy, antepartum 07/22/2014    Allergies: No Known Allergies Medications:  Current Outpatient Medications:    fluticasone (FLONASE) 50 MCG/ACT nasal spray, Place 2 sprays into both nostrils daily., Disp: 16 g, Rfl: 1   oseltamivir (TAMIFLU) 75 MG capsule, Take 1 capsule (75 mg total) by mouth 2 (two) times daily for 5 days., Disp:  10 capsule, Rfl: 0   allopurinol (ZYLOPRIM) 100 MG tablet, Take 1 tablet (100 mg total) by mouth 2 (two) times daily., Disp: 60 tablet, Rfl: 5   Cholecalciferol (VITAMIN D3 PO), Take 1 tablet by mouth daily., Disp: , Rfl:    Prenatal Vit-Fe Fumarate-FA (PRENATAL VITAMIN PO), Take 1 tablet by mouth daily., Disp: , Rfl:    sertraline (ZOLOFT) 100 MG tablet, Take 1 tablet (100 mg total) by mouth once daily., Disp: 30 tablet, Rfl: 0   sertraline (ZOLOFT) 50 MG tablet, Take 1 tablet (50 mg total) by mouth daily., Disp: 30 tablet, Rfl: 0   traZODone (DESYREL) 100 MG tablet, Take 1.5 tablets (150 mg total) by mouth at bedtime., Disp: 45 tablet, Rfl: 0  Observations/Objective: Patient is well-developed, well-nourished in no acute distress.  Resting comfortably at home.  Head is normocephalic, atraumatic.  No labored breathing.  Speech is clear and coherent with logical content.  Patient is alert and oriented at baseline.    Assessment and Plan: 1. Flu-like symptoms (Primary) - oseltamivir (TAMIFLU) 75 MG capsule; Take 1 capsule (75 mg total) by mouth 2 (two) times daily for 5 days.  Dispense: 10 capsule; Refill: 0 - fluticasone (FLONASE) 50 MCG/ACT nasal spray; Place 2 sprays into both nostrils daily.  Dispense: 16 g; Refill: 1  Increase fluids REST Take otc Tylenol or Ibuprofen for pain and fever Use Flonase for nasal congestion. May consider Tamiflu. Reviewed side effect of medicine including GI distress and headaches. She verbalized understanding. Continue to watch for worsening symptoms.  Schedule another virtual visit if symptoms don't improve. Pt verbalized understanding and in agreement.    Follow Up Instructions: I discussed the assessment and treatment plan with the patient. The patient was provided an opportunity to ask questions and all were answered. The patient agreed with the plan and demonstrated an understanding of the instructions.  A copy of instructions were sent to the  patient via MyChart unless otherwise noted below.   Patient has requested to receive PHI (AVS, Work Notes, etc) pertaining to this video visit through e-mail as they are currently without active MyChart. They have voiced understand that email is not considered secure and their health information could be viewed by someone other than the patient.   The patient was advised to call back or seek an in-person evaluation if the symptoms worsen or if the condition fails to improve as anticipated.    Gilberto Better, PA-C

## 2023-08-27 NOTE — Patient Instructions (Signed)
 Latoya Benson, thank you for joining Gilberto Better, PA-C for today's virtual visit.  While this provider is not your primary care provider (PCP), if your PCP is located in our provider database this encounter information will be shared with them immediately following your visit.   A [facility] MyChart account gives you access to today's visit and all your visits, tests, and labs performed at [facility] " click here if you don't have a [facility] MyChart account or go to [email or web address]  Consent: (Patient) Latoya Benson provided verbal consent for this virtual visit at the beginning of the encounter.  Current Medications:  Current Outpatient Medications:    fluticasone (FLONASE) 50 MCG/ACT nasal spray, Place 2 sprays into both nostrils daily., Disp: 16 g, Rfl: 1   oseltamivir (TAMIFLU) 75 MG capsule, Take 1 capsule (75 mg total) by mouth 2 (two) times daily for 5 days., Disp: 10 capsule, Rfl: 0   allopurinol (ZYLOPRIM) 100 MG tablet, Take 1 tablet (100 mg total) by mouth 2 (two) times daily., Disp: 60 tablet, Rfl: 5   Cholecalciferol (VITAMIN D3 PO), Take 1 tablet by mouth daily., Disp: , Rfl:    Prenatal Vit-Fe Fumarate-FA (PRENATAL VITAMIN PO), Take 1 tablet by mouth daily., Disp: , Rfl:    sertraline (ZOLOFT) 100 MG tablet, Take 1 tablet (100 mg total) by mouth once daily., Disp: 30 tablet, Rfl: 0   sertraline (ZOLOFT) 50 MG tablet, Take 1 tablet (50 mg total) by mouth daily., Disp: 30 tablet, Rfl: 0   traZODone (DESYREL) 100 MG tablet, Take 1.5 tablets (150 mg total) by mouth at bedtime., Disp: 45 tablet, Rfl: 0   Medications ordered in this encounter:  Meds ordered this encounter  Medications   oseltamivir (TAMIFLU) 75 MG capsule    Sig: Take 1 capsule (75 mg total) by mouth 2 (two) times daily for 5 days.    Dispense:  10 capsule    Refill:  0    Supervising Provider:   Merrilee Jansky [[id number]]   fluticasone (FLONASE) 50 MCG/ACT nasal spray     Sig: Place 2 sprays into both nostrils daily.    Dispense:  16 g    Refill:  1    Supervising Provider:   Merrilee Jansky [id number]     *If you need refills on other medications prior to your next appointment, please contact your pharmacy*  Follow-Up: Call back or seek an in-person evaluation if the symptoms worsen or if the condition fails to improve as anticipated.  [facility] [phone]  Other Instructions ncrease fluids REST Take otc Tylenol or Ibuprofen for pain and fever Use Flonase for nasal congestion. May consider Tamiflu. Reviewed side effect of medicine including GI distress and headaches. She verbalized understanding. Continue to watch for worsening symptoms.  Schedule another virtual visit if symptoms don't improve.   If you have been instructed to have an in-person evaluation today at a local Urgent Care facility, please use the link below. It will take you to a list of all of our available [facility], including address, phone number and hours of operation. Please do not delay care.  [facility]  If you or a family member do not have a primary care provider, use the link below to schedule a visit and establish care. When you choose a [facility] primary care physician or advanced practice provider, you gain a long-term partner in health. Find a Primary Care Provider  Learn more about [facility]'s in-office and virtual care options: [facility] - Get Care Now

## 2023-08-28 ENCOUNTER — Ambulatory Visit: Payer: Self-pay | Admitting: Gerontology

## 2023-08-28 ENCOUNTER — Other Ambulatory Visit: Payer: Self-pay

## 2023-08-28 DIAGNOSIS — F332 Major depressive disorder, recurrent severe without psychotic features: Secondary | ICD-10-CM

## 2023-08-28 DIAGNOSIS — G4700 Insomnia, unspecified: Secondary | ICD-10-CM

## 2023-08-28 MED ORDER — SERTRALINE HCL 100 MG PO TABS
100.0000 mg | ORAL_TABLET | Freq: Every day | ORAL | 0 refills | Status: DC
  Filled 2023-08-28 – 2023-09-12 (×2): qty 30, 30d supply, fill #0

## 2023-08-28 MED ORDER — TRAZODONE HCL 100 MG PO TABS
150.0000 mg | ORAL_TABLET | Freq: Every day | ORAL | 0 refills | Status: DC
  Filled 2023-08-28 – 2023-09-13 (×3): qty 45, 30d supply, fill #0

## 2023-08-28 MED ORDER — SERTRALINE HCL 50 MG PO TABS
50.0000 mg | ORAL_TABLET | Freq: Every day | ORAL | 0 refills | Status: DC
  Filled 2023-08-28 – 2023-09-12 (×2): qty 30, 30d supply, fill #0

## 2023-08-28 NOTE — Progress Notes (Signed)
 Established Patient Office Visit  Subjective   Patient ID: Latoya Benson, female    DOB: Apr 02, 1996  Age: 28 y.o. MRN: [id number]  No chief complaint on file.  Patient consented to telephone visit and to patient identifier was used to identify the patient. HPI  Latoya Benson is a 28 year old female who has a history of depression, gout, polycystic ovarian syndrome who presents for follow up visit and lab review.  Labs done on 08/16/23, pregnancy test was negative, her total cholesterol was 239 mg per DL, triglyceride 782 mg per DL HDL was 37 mg per DL and LDL was 956 mg per DL and the rest were unremarkable.She states that she's compliant with her medications, denies side effects and continues to make healthy lifestyle changes. She states that her mood is good, denies suicidal nor homicidal ideation .  She she had a virtual visit yesterday and was positive for Influenza and was started on Tamiflu. She states that she's planning to be pregnancy, and her LMP started 08/21/23 and lasted for  7 days.  Overall, she states that she is doing well from a follow-up no further complaints. PHQ-9 = 0 GAD- 7= 0  Review of Systems  Constitutional: Negative.   Respiratory: Negative.    Cardiovascular: Negative.   Neurological: Negative.   Psychiatric/Behavioral: Negative.        Objective:     There were no vitals taken for this visit. BP Readings from Last 3 Encounters:  05/02/23 113/72  09/14/22 122/83  08/24/22 99/71   Wt Readings from Last 3 Encounters:  05/02/23 215 lb 12.8 oz (97.9 kg)  09/14/22 207 lb 9.6 oz (94.2 kg)  08/24/22 206 lb (93.4 kg)      Physical Exam   No results found for any visits on 08/28/23.  Last CBC Lab Results  Component Value Date   WBC 7.4 08/09/2022   HGB 12.3 08/09/2022   HCT 38.0 08/09/2022   MCV 92 08/09/2022   MCH 29.6 08/09/2022   RDW 13.4 08/09/2022   PLT 464 (H) 08/09/2022   Last metabolic panel Lab Results  Component Value Date    GLUCOSE 81 08/09/2022   NA 142 08/09/2022   K 4.4 08/09/2022   CL 106 08/09/2022   CO2 18 (L) 08/09/2022   BUN 7 08/09/2022   CREATININE 0.78 08/09/2022   EGFR 106 08/09/2022   CALCIUM 9.7 08/09/2022   PROT 6.7 08/09/2022   ALBUMIN 4.1 08/09/2022   LABGLOB 2.6 08/09/2022   AGRATIO 1.6 08/09/2022   BILITOT 0.2 08/09/2022   ALKPHOS 56 08/09/2022   AST 12 08/09/2022   ALT 14 08/09/2022   ANIONGAP 9 07/27/2021   Last lipids Lab Results  Component Value Date   CHOL 239 (H) 08/16/2023   HDL 37 (L) 08/16/2023   LDLCALC 119 (H) 08/16/2023   TRIG 469 (H) 08/16/2023   CHOLHDL 6.5 (H) 08/16/2023   Last hemoglobin A1c Lab Results  Component Value Date   HGBA1C 5.8 (H) 05/03/2023   Last thyroid functions Lab Results  Component Value Date   TSH 2.200 05/03/2023   Last vitamin D Lab Results  Component Value Date   VD25OH 51.0 05/03/2023   Last vitamin B12 and Folate Lab Results  Component Value Date   VITAMINB12 656 08/16/2023   FOLATE 10.7 08/16/2023      The ASCVD Risk score (Arnett DK, et al., 2019) failed to calculate for the following reasons:   The 2019 ASCVD risk score is only  valid for ages 40 to 1    Assessment & Plan:   1. Severe episode of recurrent major depressive disorder, without psychotic features (HCC) (Primary) -She will continue current medication, was educated on the side effects of antidepressant with pregnancy and advised to notify clinic immediately she becomes pregnant.  She verbalized understanding - sertraline (ZOLOFT) 100 MG tablet; Take 1 tablet (100 mg total) by mouth once daily.  Dispense: 30 tablet; Refill: 0 - sertraline (ZOLOFT) 50 MG tablet; Take 1 tablet (50 mg total) by mouth daily.  Dispense: 30 tablet; Refill: 0  2. Insomnia, unspecified type-She will continue current medication, was educated on the side effects of antidepressant with pregnancy and advised to notify clinic immediately she becomes pregnant.  She verbalized  understanding.  She was advised to notify clinic and call the crisis helpline for worsening symptoms. - - traZODone (DESYREL) 100 MG tablet; Take 1.5 tablets (150 mg total) by mouth at bedtime.  Dispense: 45 tablet; Refill: 0   Return in about 4 weeks (around 09/25/2023), or if symptoms worsen or fail to improve.    Ashea Winiarski Trellis Paganini, NP

## 2023-08-29 ENCOUNTER — Other Ambulatory Visit: Payer: Self-pay

## 2023-09-13 ENCOUNTER — Other Ambulatory Visit: Payer: Self-pay

## 2023-09-25 ENCOUNTER — Other Ambulatory Visit: Payer: Self-pay

## 2023-09-25 ENCOUNTER — Ambulatory Visit: Payer: Self-pay | Admitting: Gerontology

## 2023-09-25 DIAGNOSIS — R200 Anesthesia of skin: Secondary | ICD-10-CM

## 2023-09-25 DIAGNOSIS — G4700 Insomnia, unspecified: Secondary | ICD-10-CM

## 2023-10-11 ENCOUNTER — Ambulatory Visit: Payer: Self-pay | Admitting: Gerontology

## 2023-10-11 DIAGNOSIS — G4700 Insomnia, unspecified: Secondary | ICD-10-CM

## 2023-10-11 DIAGNOSIS — Z8659 Personal history of other mental and behavioral disorders: Secondary | ICD-10-CM

## 2023-10-11 NOTE — Progress Notes (Signed)
 Established Patient Office Visit  Subjective   Patient ID: Latoya Benson, female    DOB: 1996-04-16  Age: 28 y.o. MRN: [id number]  No chief complaint on file.  Patient consented to telephone visit and 2 patient identifiers was used to identify patient.  HPI Latoya Benson is a 28 year old female who has a history of depression, gout, polycystic ovarian syndrome who presents for follow up visit. She states that she takes her Trazodone  150 mg around 8:30 -9 pm and falls asleep by Midnight though might wake up for bathroom breaks between 2-3 am. She states that she's not getting enough sleep.  She states that she wakes up at 5:30 am to prepare for work. She works at a [employer] Monday to Friday. She equally states that her energy level is low, she's tired all the time. She was seen at [facility] ED on 10/04/23 for nausea, vomiting and diarrhea associated with  chest pain, radiating to the breast. All her labs were normal and negative pregnancy test. She states that her symptoms has resolved. She states that she states that her appetite has decreased, and eats twice daily. She states that her mood is good, denies suicidal nor homicidal ideation. Overall, she states that she's doing well and offers no further complaint.  PHQ-9= 6 GAD-7 = 0   Review of Systems  Constitutional: Negative.   Respiratory: Negative.    Cardiovascular: Negative.   Gastrointestinal: Negative.   Neurological: Negative.   Psychiatric/Behavioral: Negative.        Objective:     There were no vitals taken for this visit. BP Readings from Last 3 Encounters:  05/02/23 113/72  09/14/22 122/83  08/24/22 99/71   Wt Readings from Last 3 Encounters:  05/02/23 215 lb 12.8 oz (97.9 kg)  09/14/22 207 lb 9.6 oz (94.2 kg)  08/24/22 206 lb (93.4 kg)      Physical Exam   No results found for any visits on 10/11/23.  Last CBC Lab Results  Component Value Date   WBC 7.4 08/09/2022   HGB 12.3 08/09/2022    HCT 38.0 08/09/2022   MCV 92 08/09/2022   MCH 29.6 08/09/2022   RDW 13.4 08/09/2022   PLT 464 (H) 08/09/2022   Last metabolic panel Lab Results  Component Value Date   GLUCOSE 81 08/09/2022   NA 142 08/09/2022   K 4.4 08/09/2022   CL 106 08/09/2022   CO2 18 (L) 08/09/2022   BUN 7 08/09/2022   CREATININE 0.78 08/09/2022   EGFR 106 08/09/2022   CALCIUM 9.7 08/09/2022   PROT 6.7 08/09/2022   ALBUMIN 4.1 08/09/2022   LABGLOB 2.6 08/09/2022   AGRATIO 1.6 08/09/2022   BILITOT 0.2 08/09/2022   ALKPHOS 56 08/09/2022   AST 12 08/09/2022   ALT 14 08/09/2022   ANIONGAP 9 07/27/2021   Last lipids Lab Results  Component Value Date   CHOL 239 (H) 08/16/2023   HDL 37 (L) 08/16/2023   LDLCALC 119 (H) 08/16/2023   TRIG 469 (H) 08/16/2023   CHOLHDL 6.5 (H) 08/16/2023   Last hemoglobin A1c Lab Results  Component Value Date   HGBA1C 5.8 (H) 05/03/2023   Last thyroid functions Lab Results  Component Value Date   TSH 2.200 05/03/2023   Last vitamin D  Lab Results  Component Value Date   VD25OH 51.0 05/03/2023   Last vitamin B12 and Folate Lab Results  Component Value Date   VITAMINB12 656 08/16/2023   FOLATE 10.7 08/16/2023  The ASCVD Risk score (Arnett DK, et al., 2019) failed to calculate for the following reasons:   The 2019 ASCVD risk score is only valid for ages 69 to 29    Assessment & Plan:   1. Insomnia, unspecified type (Primary) - She will continue current medication, will consult with Dr Lavine. She was educated and advised to practice sleep hygiene.  2. History of depression - She will continue current medication, was advised to call the Crisis help line information for worsening symptoms.   Return in about 4 weeks (around 11/08/2023), or if symptoms worsen or fail to improve.    Hairo Garraway E Chameka Mcmullen, NP

## 2023-10-16 ENCOUNTER — Ambulatory Visit: Payer: Self-pay | Admitting: Gerontology

## 2023-10-18 ENCOUNTER — Other Ambulatory Visit: Payer: Self-pay

## 2023-10-18 MED ORDER — GABAPENTIN 100 MG PO CAPS
100.0000 mg | ORAL_CAPSULE | Freq: Every day | ORAL | 0 refills | Status: DC
  Filled 2023-10-18: qty 30, 30d supply, fill #0

## 2023-10-18 NOTE — Progress Notes (Signed)
 After consulting with Dr Lavine, decided to discontinue Trazodone  and start Gabapentin 100 mg at bedtime. 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 She was educated on medication side effects, and to notify clinic, she verbalized understanding. She states that she will pick up Gabapentin tomorrow and will stop taking Trazodone .

## 2023-10-26 ENCOUNTER — Other Ambulatory Visit: Payer: Self-pay

## 2023-10-26 ENCOUNTER — Other Ambulatory Visit: Payer: Self-pay | Admitting: Gerontology

## 2023-10-26 DIAGNOSIS — F332 Major depressive disorder, recurrent severe without psychotic features: Secondary | ICD-10-CM

## 2023-10-29 ENCOUNTER — Other Ambulatory Visit: Payer: Self-pay | Admitting: Gerontology

## 2023-10-29 ENCOUNTER — Other Ambulatory Visit: Payer: Self-pay

## 2023-10-29 DIAGNOSIS — F332 Major depressive disorder, recurrent severe without psychotic features: Secondary | ICD-10-CM

## 2023-10-29 MED FILL — Sertraline HCl Tab 50 MG: ORAL | 30 days supply | Qty: 30 | Fill #0 | Status: AC

## 2023-10-29 MED FILL — Sertraline HCl Tab 100 MG: ORAL | 30 days supply | Qty: 30 | Fill #0 | Status: AC

## 2023-10-30 ENCOUNTER — Telehealth: Payer: Self-pay | Admitting: Gerontology

## 2023-10-30 ENCOUNTER — Other Ambulatory Visit: Payer: Self-pay

## 2023-11-08 ENCOUNTER — Ambulatory Visit: Payer: Self-pay | Admitting: Gerontology

## 2023-11-08 ENCOUNTER — Other Ambulatory Visit: Payer: Self-pay

## 2023-11-08 DIAGNOSIS — G4700 Insomnia, unspecified: Secondary | ICD-10-CM

## 2023-11-08 DIAGNOSIS — R200 Anesthesia of skin: Secondary | ICD-10-CM

## 2023-11-08 MED ORDER — GABAPENTIN 100 MG PO CAPS
200.0000 mg | ORAL_CAPSULE | Freq: Every day | ORAL | 0 refills | Status: DC
  Filled 2023-11-08: qty 30, 15d supply, fill #0
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
## 2023-11-19 ENCOUNTER — Other Ambulatory Visit: Payer: Self-pay | Admitting: Gerontology

## 2023-11-19 DIAGNOSIS — R200 Anesthesia of skin: Secondary | ICD-10-CM

## 2023-11-19 DIAGNOSIS — G4700 Insomnia, unspecified: Secondary | ICD-10-CM

## 2023-11-19 DIAGNOSIS — M1A09X0 Idiopathic chronic gout, multiple sites, without tophus (tophi): Secondary | ICD-10-CM

## 2023-11-20 ENCOUNTER — Other Ambulatory Visit: Payer: Self-pay

## 2023-11-20 MED ORDER — ALLOPURINOL 100 MG PO TABS
100.0000 mg | ORAL_TABLET | Freq: Two times a day (BID) | ORAL | 5 refills | Status: AC
Start: 2023-11-20 — End: ?
  Filled 2023-11-20: qty 60, 30d supply, fill #0
  Filled 2023-12-17 (×2): qty 60, 30d supply, fill #1
  Filled 2024-01-22 (×2): qty 60, 30d supply, fill #2
  Filled 2024-02-24 (×2): qty 60, 30d supply, fill #3
  Filled 2024-03-26 (×2): qty 60, 30d supply, fill #4
  Filled 2024-04-23 (×2): qty 60, 30d supply, fill #5

## 2023-11-20 MED ORDER — GABAPENTIN 100 MG PO CAPS
200.0000 mg | ORAL_CAPSULE | Freq: Every day | ORAL | 0 refills | Status: DC
Start: 2023-11-20 — End: 2023-11-22
  Filled 2023-11-20: qty 30, 15d supply, fill #0
  Filled ????-??-??: fill #0

## 2023-11-22 ENCOUNTER — Other Ambulatory Visit: Payer: Self-pay

## 2023-11-22 ENCOUNTER — Ambulatory Visit: Payer: Self-pay | Admitting: Gerontology

## 2023-11-22 DIAGNOSIS — F332 Major depressive disorder, recurrent severe without psychotic features: Secondary | ICD-10-CM

## 2023-11-22 DIAGNOSIS — R200 Anesthesia of skin: Secondary | ICD-10-CM

## 2023-11-22 DIAGNOSIS — G4700 Insomnia, unspecified: Secondary | ICD-10-CM

## 2023-11-22 MED ORDER — SERTRALINE HCL 100 MG PO TABS
100.0000 mg | ORAL_TABLET | Freq: Every day | ORAL | 0 refills | Status: DC
  Filled 2023-11-22 – 2023-12-17 (×2): qty 30, 30d supply, fill #0

## 2023-11-22 MED ORDER — GABAPENTIN 100 MG PO CAPS
200.0000 mg | ORAL_CAPSULE | Freq: Every day | ORAL | 0 refills | Status: DC
  Filled 2023-11-22: qty 60, 30d supply, fill #0

## 2023-11-22 MED ORDER — SERTRALINE HCL 50 MG PO TABS
50.0000 mg | ORAL_TABLET | Freq: Every day | ORAL | 0 refills | Status: DC
  Filled 2023-11-22 – 2023-12-17 (×2): qty 30, 30d supply, fill #0

## 2023-11-22 NOTE — Progress Notes (Signed)
 Established Patient Office Visit  Subjective   Patient ID: Latoya Benson, female    DOB: [birth year]  Age: 28 y.o. MRN: [id number]  No chief complaint on file.  Patient consents to telephone visit and 2 patient identifiers was used to identify patient.  HPI  Latoya Benson is a 28 year old female who has a history of depression, gout, polycystic ovarian syndrome who presents for follow up visit . Her Gabapentin   was increased to 200 mg for insomnia during her last visit. She  reports that her sleep has improved and she sleeps between 6-7 hours. She denies tiredness and low energy during the day. She states that her neuropathy has improved  and  not as bad as it was  before  with  200 mg gabapentin . She states that her mood is good, denies suicidal nor homicidal ideation.  She states that her LMP was in March,  but am not pregnant and it's normal for me though she did not do any pregnancy test. She states tat if no period next month, will follow up with Ob/Gyn. Overall, she states that she's doing well and offers no further complaint.    PHQ -9 = 0 GAD -7 =0  Review of Systems  Constitutional: Negative.   Eyes: Negative.   Respiratory: Negative.    Cardiovascular: Negative.   Skin: Negative.   Neurological: Negative.   Psychiatric/Behavioral: Negative.        Objective:     There were no vitals taken for this visit. BP Readings from Last 3 Encounters:  05/02/23 113/72  09/14/22 122/83  08/24/22 99/71   Wt Readings from Last 3 Encounters:  05/02/23 215 lb 12.8 oz (97.9 kg)  09/14/22 207 lb 9.6 oz (94.2 kg)  08/24/22 206 lb (93.4 kg)      Physical Exam   No results found for any visits on 11/22/23.  Last CBC Lab Results  Component Value Date   WBC 7.4 08/09/2022   HGB 12.3 08/09/2022   HCT 38.0 08/09/2022   MCV 92 08/09/2022   MCH 29.6 08/09/2022   RDW 13.4 08/09/2022   PLT 464 (H) 08/09/2022   Last metabolic panel Lab Results  Component Value  Date   GLUCOSE 81 08/09/2022   NA 142 08/09/2022   K 4.4 08/09/2022   CL 106 08/09/2022   CO2 18 (L) 08/09/2022   BUN 7 08/09/2022   CREATININE 0.78 08/09/2022   EGFR 106 08/09/2022   CALCIUM 9.7 08/09/2022   PROT 6.7 08/09/2022   ALBUMIN 4.1 08/09/2022   LABGLOB 2.6 08/09/2022   AGRATIO 1.6 08/09/2022   BILITOT 0.2 08/09/2022   ALKPHOS 56 08/09/2022   AST 12 08/09/2022   ALT 14 08/09/2022   ANIONGAP 9 07/27/2021   Last lipids Lab Results  Component Value Date   CHOL 239 (H) 08/16/2023   HDL 37 (L) 08/16/2023   LDLCALC 119 (H) 08/16/2023   TRIG 469 (H) 08/16/2023   CHOLHDL 6.5 (H) 08/16/2023   Last hemoglobin A1c Lab Results  Component Value Date   HGBA1C 5.8 (H) 05/03/2023   Last thyroid functions Lab Results  Component Value Date   TSH 2.200 05/03/2023   Last vitamin D  Lab Results  Component Value Date   VD25OH 51.0 05/03/2023   Last vitamin B12 and Folate Lab Results  Component Value Date   VITAMINB12 656 08/16/2023   FOLATE 10.7 08/16/2023      The ASCVD Risk score (Arnett DK, et al., 2019) failed to calculate  for the following reasons:   The 2019 ASCVD risk score is only valid for ages 97 to 6    Assessment & Plan:   1. Severe episode of recurrent major depressive disorder, without psychotic features (HCC) - Her depression is under control, she agrees to continue current medication.  She was advised to call the Crisis helpline with worsening symptoms and will consult wit Dr Lavine. - sertraline  (ZOLOFT ) 100 MG tablet; Take 1 tablet (100 mg total) by mouth once daily.  Dispense: 30 tablet; Refill: 0 - sertraline  (ZOLOFT ) 50 MG tablet; Take 1 tablet (50 mg total) by mouth daily.  Dispense: 30 tablet; Refill: 0  2. Insomnia, unspecified type (Primary) - Her sleep has improved and she agreed to continue on 200 mg gabapentin . - gabapentin  (NEURONTIN ) 100 MG capsule; Take 2 capsules (200 mg total) by mouth at bedtime.  Dispense: 60 capsule; Refill:  0  3. Numbness and tingling in both hands - Numbness has improved and will continue on current medication, was advised to notify clinic for worsening symptoms. - gabapentin  (NEURONTIN ) 100 MG capsule; Take 2 capsules (200 mg total) by mouth at bedtime.  Dispense: 60 capsule; Refill: 0   Return in about 4 weeks (around 12/20/2023), or if symptoms worsen or fail to improve, for F/U 12/20/23 Telephone @ 3 pm.    Latoya Inthavong E Jakiah Goree, NP

## 2023-12-17 ENCOUNTER — Other Ambulatory Visit: Payer: Self-pay | Admitting: Gerontology

## 2023-12-17 ENCOUNTER — Other Ambulatory Visit: Payer: Self-pay

## 2023-12-17 DIAGNOSIS — G4700 Insomnia, unspecified: Secondary | ICD-10-CM

## 2023-12-17 DIAGNOSIS — R202 Paresthesia of skin: Secondary | ICD-10-CM

## 2023-12-18 ENCOUNTER — Other Ambulatory Visit: Payer: Self-pay

## 2023-12-18 MED FILL — Gabapentin Cap 100 MG: ORAL | 30 days supply | Qty: 60 | Fill #0 | Status: AC

## 2023-12-20 ENCOUNTER — Other Ambulatory Visit: Payer: Self-pay

## 2023-12-20 ENCOUNTER — Ambulatory Visit: Payer: Self-pay | Admitting: Gerontology

## 2023-12-20 DIAGNOSIS — F332 Major depressive disorder, recurrent severe without psychotic features: Secondary | ICD-10-CM

## 2023-12-20 DIAGNOSIS — G4700 Insomnia, unspecified: Secondary | ICD-10-CM

## 2023-12-20 DIAGNOSIS — R200 Anesthesia of skin: Secondary | ICD-10-CM

## 2023-12-20 DIAGNOSIS — Z0000 Encounter for general adult medical examination without abnormal findings: Secondary | ICD-10-CM

## 2023-12-20 MED ORDER — GABAPENTIN 100 MG PO CAPS: 200.0000 mg | ORAL_CAPSULE | Freq: Every day | ORAL | 0 refills | Status: DC

## 2023-12-20 MED ORDER — SERTRALINE HCL 50 MG PO TABS
50.0000 mg | ORAL_TABLET | Freq: Every day | ORAL | 0 refills | Status: DC
  Filled 2024-01-22: qty 30, 30d supply, fill #0

## 2023-12-20 MED ORDER — SERTRALINE HCL 100 MG PO TABS
100.0000 mg | ORAL_TABLET | Freq: Every day | ORAL | 0 refills | Status: DC
  Filled 2024-01-22: qty 30, 30d supply, fill #0

## 2023-12-20 NOTE — Progress Notes (Signed)
 Established Patient Office Visit  Subjective   Patient ID: Latoya Benson, female    DOB: [birth year]  Age: 28 y.o. MRN: [id number]  No chief complaint on file.  Patient consents to telephone visit and 2 patient identifiers was used to identify patient.   HPI  Latoya Benson is a 28 year old female who has a history of depression, gout, polycystic ovarian syndrome who presents for follow up visit . She states that she's compliant with her medications, denies side effects and continues to make healthy lifestyle changes.  She continues on 200 mg of gabapentin , states that she gets 6-8 hours of sleep, denies any tiredness nor low energy during the day and her neuropathy has improved. She states that her mood is good, denies suicidal nor homicidal ideation. She states that her LMP was in March,  but am not pregnant and it's normal for me though she did not do any pregnancy test. Overall, she states that she's doing well and offers no further complaint.   PHQ-9 = 0  GAD-7 = 0  Review of Systems  Constitutional: Negative.   Eyes: Negative.   Respiratory: Negative.    Cardiovascular: Negative.   Gastrointestinal: Negative.   Genitourinary: Negative.   Neurological: Negative.   Psychiatric/Behavioral: Negative.        Objective:     There were no vitals taken for this visit. BP Readings from Last 3 Encounters:  05/02/23 113/72  09/14/22 122/83  08/24/22 99/71   Wt Readings from Last 3 Encounters:  05/02/23 215 lb 12.8 oz (97.9 kg)  09/14/22 207 lb 9.6 oz (94.2 kg)  08/24/22 206 lb (93.4 kg)      Physical Exam   No results found for any visits on 12/20/23.  Last CBC Lab Results  Component Value Date   WBC 7.4 08/09/2022   HGB 12.3 08/09/2022   HCT 38.0 08/09/2022   MCV 92 08/09/2022   MCH 29.6 08/09/2022   RDW 13.4 08/09/2022   PLT 464 (H) 08/09/2022   Last metabolic panel Lab Results  Component Value Date   GLUCOSE 81 08/09/2022   NA 142 08/09/2022    K 4.4 08/09/2022   CL 106 08/09/2022   CO2 18 (L) 08/09/2022   BUN 7 08/09/2022   CREATININE 0.78 08/09/2022   EGFR 106 08/09/2022   CALCIUM 9.7 08/09/2022   PROT 6.7 08/09/2022   ALBUMIN 4.1 08/09/2022   LABGLOB 2.6 08/09/2022   AGRATIO 1.6 08/09/2022   BILITOT 0.2 08/09/2022   ALKPHOS 56 08/09/2022   AST 12 08/09/2022   ALT 14 08/09/2022   ANIONGAP 9 07/27/2021   Last lipids Lab Results  Component Value Date   CHOL 239 (H) 08/16/2023   HDL 37 (L) 08/16/2023   LDLCALC 119 (H) 08/16/2023   TRIG 469 (H) 08/16/2023   CHOLHDL 6.5 (H) 08/16/2023   Last hemoglobin A1c Lab Results  Component Value Date   HGBA1C 5.8 (H) 05/03/2023   Last thyroid functions Lab Results  Component Value Date   TSH 2.200 05/03/2023   Last vitamin D  Lab Results  Component Value Date   VD25OH 51.0 05/03/2023   Last vitamin B12 and Folate Lab Results  Component Value Date   VITAMINB12 656 08/16/2023   FOLATE 10.7 08/16/2023      The ASCVD Risk score (Arnett DK, et al., 2019) failed to calculate for the following reasons:   The 2019 ASCVD risk score is only valid for ages 31 to 17    Assessment &  Plan:   1. Insomnia, unspecified type (Primary) - Her sleep has improved and will continue on current medication. - gabapentin  (NEURONTIN ) 100 MG capsule; Take 2 capsules (200 mg total) by mouth at bedtime.  Dispense: 60 capsule; Refill: 0  2. Numbness and tingling in both hands - Her neuropathy has improved and she will continue on current medication. - gabapentin  (NEURONTIN ) 100 MG capsule; Take 2 capsules (200 mg total) by mouth at bedtime.  Dispense: 60 capsule; Refill: 0  3. Severe episode of recurrent major depressive disorder, without psychotic features (HCC) - Her depression is under control, will continue on current medication, was advised to call the Crisis help line for worsening symptoms. - sertraline  (ZOLOFT ) 100 MG tablet; Take 1 tablet (100 mg total) by mouth once daily.   Dispense: 30 tablet; Refill: 0 - sertraline  (ZOLOFT ) 50 MG tablet; Take 1 tablet (50 mg total) by mouth daily.  Dispense: 30 tablet; Refill: 0  4. Health care maintenance - Routine labs will be checked and possible Ob/Gyn referral for irregular menstruation. - Comp Met (CMET); Future - CBC w/Diff; Future - HgB A1c; Future - POCT urine pregnancy; Future   Return in about 2 weeks (around 01/03/2024), or if symptoms worsen or fail to improve, for Lab 12/27/23 @ 6 pm and F/U 01/03/24 @ 3 pm Telephone visit..    Anfernee Peschke E Ladon Heney, NP

## 2023-12-27 ENCOUNTER — Other Ambulatory Visit: Payer: Self-pay

## 2023-12-27 DIAGNOSIS — Z0000 Encounter for general adult medical examination without abnormal findings: Secondary | ICD-10-CM

## 2023-12-29 LAB — CBC WITH DIFFERENTIAL/PLATELET
Basophils Absolute: 0.1 x10E3/uL (ref 0.0–0.2)
Basos: 1 %
EOS (ABSOLUTE): 0.1 x10E3/uL (ref 0.0–0.4)
Eos: 2 %
Hematocrit: 39.3 % (ref 34.0–46.6)
Hemoglobin: 13.4 g/dL (ref 11.1–15.9)
Immature Grans (Abs): 0 x10E3/uL (ref 0.0–0.1)
Immature Granulocytes: 0 %
Lymphocytes Absolute: 2.6 x10E3/uL (ref 0.7–3.1)
Lymphs: 32 %
MCH: 31 pg (ref 26.6–33.0)
MCHC: 34.1 g/dL (ref 31.5–35.7)
MCV: 91 fL (ref 79–97)
Monocytes Absolute: 0.5 x10E3/uL (ref 0.1–0.9)
Monocytes: 6 %
Neutrophils Absolute: 4.9 x10E3/uL (ref 1.4–7.0)
Neutrophils: 59 %
Platelets: 396 x10E3/uL (ref 150–450)
RBC: 4.32 x10E6/uL (ref 3.77–5.28)
RDW: 12.7 % (ref 11.7–15.4)
WBC: 8.2 x10E3/uL (ref 3.4–10.8)

## 2023-12-29 LAB — COMPREHENSIVE METABOLIC PANEL WITH GFR
ALT: 40 IU/L — ABNORMAL HIGH (ref 0–32)
AST: 24 IU/L (ref 0–40)
Albumin: 4.7 g/dL (ref 4.0–5.0)
Alkaline Phosphatase: 71 IU/L (ref 44–121)
BUN/Creatinine Ratio: 12 (ref 9–23)
BUN: 9 mg/dL (ref 6–20)
Bilirubin Total: <0.2 mg/dL (ref 0.0–1.2)
CO2: 20 mmol/L (ref 20–29)
Calcium: 9.7 mg/dL (ref 8.7–10.2)
Chloride: 102 mmol/L (ref 96–106)
Creatinine, Ser: 0.75 mg/dL (ref 0.57–1.00)
Globulin, Total: 2.7 g/dL (ref 1.5–4.5)
Glucose: 93 mg/dL (ref 70–99)
Potassium: 4 mmol/L (ref 3.5–5.2)
Sodium: 139 mmol/L (ref 134–144)
Total Protein: 7.4 g/dL (ref 6.0–8.5)
eGFR: 110 mL/min/1.73 (ref >59)

## 2023-12-29 LAB — PREGNANCY, URINE

## 2023-12-29 LAB — HEMOGLOBIN A1C
Est. average glucose Bld gHb Est-mCnc: 117 mg/dL
Hgb A1c MFr Bld: 5.7 % — ABNORMAL HIGH (ref 4.8–5.6)

## 2024-01-03 ENCOUNTER — Ambulatory Visit: Payer: Self-pay | Admitting: Gerontology

## 2024-01-03 DIAGNOSIS — G4700 Insomnia, unspecified: Secondary | ICD-10-CM

## 2024-01-03 DIAGNOSIS — R7303 Prediabetes: Secondary | ICD-10-CM

## 2024-01-03 DIAGNOSIS — N926 Irregular menstruation, unspecified: Secondary | ICD-10-CM | POA: Insufficient documentation

## 2024-01-03 NOTE — Progress Notes (Signed)
 Established Patient Office Visit  Subjective   Patient ID: Latoya Benson, female    DOB: 1995-07-28  Age: 28 y.o. MRN: [id number]  No chief complaint on file.  Patient consents to telephone visit and 2 patient identifiers was used to identify patient.  HPI  Latoya Benson is a 28 year old female who has a history of depression, gout, polycystic ovarian syndrome who presents for follow up visit and lab review . She states that she's compliant with her medications, denies side effects and continues to make healthy lifestyle changes.  She states that her LMP was in March,her pregnancy test was negative, She states that she goes 3 months without her period, but she's trying to get pregnant.  She has a history of PCOS and her HgBA1c was 5.7%.  She states that her numbness has improved, but  currently, she's not getting enough sleep. She reports taking  gabapentin  at 8:30 pm , but falls asleep between 11 pm and 12 am and wakes up at 5:30 am during the week. She states that her mood is good, denies suicidal nor homicidal ideation. Overall, she states that she's doing well and offers no further complaints.  GAD-7 = 0 PHQ-9 = 6   Review of Systems  Constitutional: Negative.   Eyes: Negative.   Respiratory: Negative.    Cardiovascular: Negative.   Neurological: Negative.   Psychiatric/Behavioral:  The patient has insomnia.       Objective:     There were no vitals taken for this visit. BP Readings from Last 3 Encounters:  05/02/23 113/72  09/14/22 122/83  08/24/22 99/71   Wt Readings from Last 3 Encounters:  05/02/23 215 lb 12.8 oz (97.9 kg)  09/14/22 207 lb 9.6 oz (94.2 kg)  08/24/22 206 lb (93.4 kg)      Physical Exam   No results found for any visits on 01/03/24.  Last CBC Lab Results  Component Value Date   WBC 8.2 12/27/2023   HGB 13.4 12/27/2023   HCT 39.3 12/27/2023   MCV 91 12/27/2023   MCH 31.0 12/27/2023   RDW 12.7 12/27/2023   PLT 396 12/27/2023    Last metabolic panel Lab Results  Component Value Date   GLUCOSE 93 12/27/2023   NA 139 12/27/2023   K 4.0 12/27/2023   CL 102 12/27/2023   CO2 20 12/27/2023   BUN 9 12/27/2023   CREATININE 0.75 12/27/2023   EGFR 110 12/27/2023   CALCIUM 9.7 12/27/2023   PROT 7.4 12/27/2023   ALBUMIN 4.7 12/27/2023   LABGLOB 2.7 12/27/2023   AGRATIO 1.6 08/09/2022   BILITOT <0.2 12/27/2023   ALKPHOS 71 12/27/2023   AST 24 12/27/2023   ALT 40 (H) 12/27/2023   ANIONGAP 9 07/27/2021   Last lipids Lab Results  Component Value Date   CHOL 239 (H) 08/16/2023   HDL 37 (L) 08/16/2023   LDLCALC 119 (H) 08/16/2023   TRIG 469 (H) 08/16/2023   CHOLHDL 6.5 (H) 08/16/2023   Last hemoglobin A1c Lab Results  Component Value Date   HGBA1C 5.7 (H) 12/27/2023   Last thyroid functions Lab Results  Component Value Date   TSH 2.200 05/03/2023   Last vitamin D  Lab Results  Component Value Date   VD25OH 51.0 05/03/2023   Last vitamin B12 and Folate Lab Results  Component Value Date   VITAMINB12 656 08/16/2023   FOLATE 10.7 08/16/2023      The ASCVD Risk score (Arnett DK, et al., 2019) failed to calculate  for the following reasons:   The 2019 ASCVD risk score is only valid for ages 46 to 2    Assessment & Plan:   1. Insomnia, unspecified type (Primary) - She was advised to take gabapentin  at 10 pm, and will consult with Psychiatrist Dr Lavine. She was advised to cal the Crisis help line for worsening symptoms.  2. Prediabetes - Her HgbA1c was 5.7%, she was advised to continue on low carb/non concentrated sweet diet and exercise as tolerated.  3. Irregular periods/menstrual cycles - She has history of PCOS, and will follow up with Ob/Gyn. - Ambulatory referral to Obstetrics / Gynecology   Return in about 1 week (around 01/10/2024), or if symptoms worsen or fail to improve.    Khrista Braun E Geoffry Bannister, NP

## 2024-01-07 ENCOUNTER — Ambulatory Visit (LOCAL_COMMUNITY_HEALTH_CENTER): Payer: Self-pay

## 2024-01-07 DIAGNOSIS — Z111 Encounter for screening for respiratory tuberculosis: Secondary | ICD-10-CM

## 2024-01-10 ENCOUNTER — Ambulatory Visit (LOCAL_COMMUNITY_HEALTH_CENTER): Payer: Self-pay

## 2024-01-10 ENCOUNTER — Ambulatory Visit: Payer: Self-pay | Admitting: Gerontology

## 2024-01-10 ENCOUNTER — Other Ambulatory Visit: Payer: Self-pay

## 2024-01-10 DIAGNOSIS — G4700 Insomnia, unspecified: Secondary | ICD-10-CM

## 2024-01-10 DIAGNOSIS — Z111 Encounter for screening for respiratory tuberculosis: Secondary | ICD-10-CM

## 2024-01-10 DIAGNOSIS — R200 Anesthesia of skin: Secondary | ICD-10-CM

## 2024-01-10 LAB — TB SKIN TEST
Induration: 0 mm
TB Skin Test: NEGATIVE

## 2024-01-10 MED ORDER — GABAPENTIN 300 MG PO CAPS
300.0000 mg | ORAL_CAPSULE | Freq: Every day | ORAL | 0 refills | Status: DC
  Filled 2024-01-10: qty 30, 30d supply, fill #0

## 2024-01-10 NOTE — Patient Instructions (Signed)
 Insomnia Insomnia is a sleep disorder that makes it difficult to fall asleep or stay asleep. Insomnia can cause fatigue, low energy, difficulty concentrating, mood swings, and poor performance at work or school. There are three different ways to classify insomnia: Difficulty falling asleep. Difficulty staying asleep. Waking up too early in the morning. Any type of insomnia can be long-term (chronic) or short-term (acute). Both are common. Short-term insomnia usually lasts for 3 months or less. Chronic insomnia occurs at least three times a week for longer than 3 months. What are the causes? Insomnia may be caused by another condition, situation, or substance, such as: Having certain mental health conditions, such as anxiety and depression. Using caffeine, alcohol, tobacco, or drugs. Having gastrointestinal conditions, such as gastroesophageal reflux disease (GERD). Having certain medical conditions. These include: Asthma. Alzheimer's disease. Stroke. Chronic pain. An overactive thyroid gland (hyperthyroidism). Other sleep disorders, such as restless legs syndrome and sleep apnea. Menopause. Sometimes, the cause of insomnia may not be known. What increases the risk? Risk factors for insomnia include: Gender. Females are affected more often than males. Age. Insomnia is more common as people get older. Stress and certain medical and mental health conditions. Lack of exercise. Having an irregular work schedule. This may include working night shifts and traveling between different time zones. What are the signs or symptoms? If you have insomnia, the main symptom is having trouble falling asleep or having trouble staying asleep. This may lead to other symptoms, such as: Feeling tired or having low energy. Feeling nervous about going to sleep. Not feeling rested in the morning. Having trouble concentrating. Feeling irritable, anxious, or depressed. How is this diagnosed? This condition  may be diagnosed based on: Your symptoms and medical history. Your health care provider may ask about: Your sleep habits. Any medical conditions you have. Your mental health. A physical exam. How is this treated? Treatment for insomnia depends on the cause. Treatment may focus on treating an underlying condition that is causing the insomnia. Treatment may also include: Medicines to help you sleep. Counseling or therapy. Lifestyle adjustments to help you sleep better. Follow these instructions at home: Eating and drinking  Limit or avoid alcohol, caffeinated beverages, and products that contain nicotine and tobacco, especially close to bedtime. These can disrupt your sleep. Do not eat a large meal or eat spicy foods right before bedtime. This can lead to digestive discomfort that can make it hard for you to sleep. Sleep habits  Keep a sleep diary to help you and your health care provider figure out what could be causing your insomnia. Write down: When you sleep. When you wake up during the night. How well you sleep and how rested you feel the next day. Any side effects of medicines you are taking. What you eat and drink. Make your bedroom a dark, comfortable place where it is easy to fall asleep. Put up shades or blackout curtains to block light from outside. Use a white noise machine to block noise. Keep the temperature cool. Limit screen use before bedtime. This includes: Not watching TV. Not using your smartphone, tablet, or computer. Stick to a routine that includes going to bed and waking up at the same times every day and night. This can help you fall asleep faster. Consider making a quiet activity, such as reading, part of your nighttime routine. Try to avoid taking naps during the day so that you sleep better at night. Get out of bed if you are still awake after  15 minutes of trying to sleep. Keep the lights down, but try reading or doing a quiet activity. When you feel  sleepy, go back to bed. General instructions Take over-the-counter and prescription medicines only as told by your health care provider. Exercise regularly as told by your health care provider. However, avoid exercising in the hours right before bedtime. Use relaxation techniques to manage stress. Ask your health care provider to suggest some techniques that may work well for you. These may include: Breathing exercises. Routines to release muscle tension. Visualizing peaceful scenes. Make sure that you drive carefully. Do not drive if you feel very sleepy. Keep all follow-up visits. This is important. Contact a health care provider if: You are tired throughout the day. You have trouble in your daily routine due to sleepiness. You continue to have sleep problems, or your sleep problems get worse. Get help right away if: You have thoughts about hurting yourself or someone else. Get help right away if you feel like you may hurt yourself or others, or have thoughts about taking your own life. Go to your nearest emergency room or: Call 911. Call the National Suicide Prevention Lifeline at (906)021-1611 or 988. This is open 24 hours a day. Text the Crisis Text Line at 325-069-2793. Summary Insomnia is a sleep disorder that makes it difficult to fall asleep or stay asleep. Insomnia can be long-term (chronic) or short-term (acute). Treatment for insomnia depends on the cause. Treatment may focus on treating an underlying condition that is causing the insomnia. Keep a sleep diary to help you and your health care provider figure out what could be causing your insomnia. This information is not intended to replace advice given to you by your health care provider. Make sure you discuss any questions you have with your health care provider. Document Revised: 05/09/2021 Document Reviewed: 05/09/2021 Elsevier Patient Education  2024 [employer].

## 2024-01-10 NOTE — Progress Notes (Signed)
 Established Patient Office Visit  Subjective   Patient ID: Latoya Benson, female    DOB: 1995-07-09  Age: 28 y.o. MRN: [id number]  No chief complaint on file.  Patient consents to telephone visit and 2 patient identifiers was used to identify patient.  HPI Latoya Benson is a 28 year old female who has a history of depression, gout, polycystic ovarian syndrome who presents for follow up visit . She states that she's compliant with her medications, denies side effects and continues to make healthy lifestyle changes. During her last visit, she c/o not getting enough sleep with taking 200 mg of Gabapentin . Consulted with Psychiatrist Dr Lavine, advised to increase gabapentin  to 300 mg at at bedtime. Overall, she states that her mood is good, denies suicidal nor homicidal ideation will continue to follow up with [facility]. She offers no further complaint.   Review of Systems  Constitutional: Negative.   Respiratory: Negative.    Cardiovascular: Negative.   Neurological: Negative.   Psychiatric/Behavioral:  The patient has insomnia.       Objective:     There were no vitals taken for this visit. BP Readings from Last 3 Encounters:  05/02/23 113/72  09/14/22 122/83  08/24/22 99/71   Wt Readings from Last 3 Encounters:  05/02/23 215 lb 12.8 oz (97.9 kg)  09/14/22 207 lb 9.6 oz (94.2 kg)  08/24/22 206 lb (93.4 kg)      Physical Exam   No results found for any visits on 01/10/24.  Last CBC Lab Results  Component Value Date   WBC 8.2 12/27/2023   HGB 13.4 12/27/2023   HCT 39.3 12/27/2023   MCV 91 12/27/2023   MCH 31.0 12/27/2023   RDW 12.7 12/27/2023   PLT 396 12/27/2023   Last metabolic panel Lab Results  Component Value Date   GLUCOSE 93 12/27/2023   NA 139 12/27/2023   K 4.0 12/27/2023   CL 102 12/27/2023   CO2 20 12/27/2023   BUN 9 12/27/2023   CREATININE 0.75 12/27/2023   EGFR 110 12/27/2023   CALCIUM 9.7 12/27/2023   PROT 7.4  12/27/2023   ALBUMIN 4.7 12/27/2023   LABGLOB 2.7 12/27/2023   AGRATIO 1.6 08/09/2022   BILITOT <0.2 12/27/2023   ALKPHOS 71 12/27/2023   AST 24 12/27/2023   ALT 40 (H) 12/27/2023   ANIONGAP 9 07/27/2021   Last lipids Lab Results  Component Value Date   CHOL 239 (H) 08/16/2023   HDL 37 (L) 08/16/2023   LDLCALC 119 (H) 08/16/2023   TRIG 469 (H) 08/16/2023   CHOLHDL 6.5 (H) 08/16/2023   Last hemoglobin A1c Lab Results  Component Value Date   HGBA1C 5.7 (H) 12/27/2023   Last thyroid functions Lab Results  Component Value Date   TSH 2.200 05/03/2023   Last vitamin D  Lab Results  Component Value Date   VD25OH 51.0 05/03/2023   Last vitamin B12 and Folate Lab Results  Component Value Date   VITAMINB12 656 08/16/2023   FOLATE 10.7 08/16/2023      The ASCVD Risk score (Arnett DK, et al., 2019) failed to calculate for the following reasons:   The 2019 ASCVD risk score is only valid for ages 45 to 45    Assessment & Plan:   1. Insomnia, unspecified type (Primary) - Kennidee is competent to make decision, she agreed to increasing gabapentin  to 300 mg at bedtime.  She was advised to follow proper sleep hygiene, take medication 1 hour before bedtime.  She was educated on the side effects of medication and to notify clinic. - gabapentin  (NEURONTIN ) 300 MG capsule; Take 1 capsule (300 mg total) by mouth at bedtime.  Dispense: 30 capsule; Refill: 0  2. 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 - gabapentin  (NEURONTIN ) 300 MG capsule; Take 1 capsule (300 mg total) by mouth at bedtime.  Dispense: 30 capsule; Refill: 0   Return in about 3 weeks (around 01/31/2024), or if symptoms worsen or fail to improve.   Dragon was used provide this note.  Javier Mamone E Hayward Rylander, NP

## 2024-01-22 ENCOUNTER — Other Ambulatory Visit: Payer: Self-pay

## 2024-01-29 ENCOUNTER — Ambulatory Visit: Payer: Self-pay | Admitting: Licensed Clinical Social Worker

## 2024-01-29 DIAGNOSIS — F339 Major depressive disorder, recurrent, unspecified: Secondary | ICD-10-CM

## 2024-01-29 DIAGNOSIS — F411 Generalized anxiety disorder: Secondary | ICD-10-CM

## 2024-01-29 DIAGNOSIS — G4700 Insomnia, unspecified: Secondary | ICD-10-CM
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
## 2024-01-29 NOTE — BH Specialist Note (Deleted)
 Integrated Behavioral Health Initial In-Person Visit  MRN: [id number] Name: Latoya Benson  Number of Integrated Behavioral Health Clinician visits: No data recorded Session Start time: No data recorded   Session End time: No data recorded Total time in minutes: No data recorded   Types of Service: {CHL AMB TYPE OF SERVICE:[phone]}  Interpretor:No. Interpretor Name and Language: NA   Subjective: Latoya Benson is a 28 y.o. female accompanied by herself  Patient was referred by Almarie Simmonds, NP for mental health. Patient reports the following symptoms/concerns: *** Duration of problem: year; Severity of problem: severe  Objective: Mood: {BHH MOOD:22306} and Affect: {BHH AFFECT:22307} Risk of harm to self or others: {CHL AMB BH Suicide Current Mental Status:21022748}  Life Context: Family and Social: see above School/Work: see above Self-Care: see above Life Changes: see above  Patient and/or Family's Strengths/Protective Factors: {CHL AMB BH PROTECTIVE FACTORS:[phone]}  Goals Addressed: Patient will: Reduce symptoms of: {IBH Symptoms:21014056} Increase knowledge and/or ability of: {IBH Patient Tools:21014057}  Demonstrate ability to: {IBH Goals:21014053}  Progress towards Goals: {CHL AMB BH PROGRESS TOWARDS [phone]}  Interventions: Interventions utilized: {IBH Interventions:21014054}  Standardized Assessments completed: {IBH Screening Tools:21014051}     Patient and/or Family Response: ***  Patient Centered Plan: Patient is on the following Treatment Plan(s):  ***  Clinical Assessment/Diagnosis  No diagnosis found.   Assessment: Patient currently experiencing see above.   Patient may benefit from see above.  Plan: Follow up with behavioral health clinician on : *** Behavioral recommendations: *** Referral(s): {IBH Referrals:21014055}  Powell JINNY Griffin, LCSWA

## 2024-01-31 ENCOUNTER — Ambulatory Visit: Payer: Self-pay | Admitting: Gerontology

## 2024-02-07 ENCOUNTER — Other Ambulatory Visit: Payer: Self-pay

## 2024-02-07 ENCOUNTER — Other Ambulatory Visit: Payer: Self-pay | Admitting: Gerontology

## 2024-02-07 DIAGNOSIS — G4700 Insomnia, unspecified: Secondary | ICD-10-CM

## 2024-02-07 DIAGNOSIS — R200 Anesthesia of skin: Secondary | ICD-10-CM

## 2024-02-07 MED ORDER — GABAPENTIN 300 MG PO CAPS
300.0000 mg | ORAL_CAPSULE | Freq: Every day | ORAL | 0 refills | Status: DC
  Filled 2024-02-15: qty 30, 30d supply, fill #0

## 2024-02-12 ENCOUNTER — Other Ambulatory Visit: Payer: Self-pay

## 2024-02-12 ENCOUNTER — Telehealth: Payer: Self-pay | Admitting: Physician Assistant

## 2024-02-12 DIAGNOSIS — B349 Viral infection, unspecified: Secondary | ICD-10-CM

## 2024-02-12 MED ORDER — BENZONATATE 100 MG PO CAPS
100.0000 mg | ORAL_CAPSULE | Freq: Three times a day (TID) | ORAL | 0 refills | Status: AC
  Filled 2024-02-12: qty 15, 5d supply, fill #0

## 2024-02-12 NOTE — Patient Instructions (Signed)
 Corean LITTIE Agent, thank you for joining Lynden GORMAN Snuffer, PA-C for today's virtual visit.  While this provider is not your primary care provider (PCP), if your PCP is located in our provider database this encounter information will be shared with them immediately following your visit.   A [facility] MyChart account gives you access to today's visit and all your visits, tests, and labs performed at [facility]  click here if you don't have a [facility] MyChart account or go to [email or web address]  Consent: (Patient) Corean LITTIE Agent provided verbal consent for this virtual visit at the beginning of the encounter.  Current Medications:  Current Outpatient Medications:    allopurinol  (ZYLOPRIM ) 100 MG tablet, Take 1 tablet (100 mg total) by mouth 2 (two) times daily., Disp: 60 tablet, Rfl: 5   gabapentin  (NEURONTIN ) 300 MG capsule, Take 1 capsule (300 mg total) by mouth at bedtime., Disp: 30 capsule, Rfl: 0   sertraline  (ZOLOFT ) 100 MG tablet, Take 1 tablet (100 mg total) by mouth once daily., Disp: 30 tablet, Rfl: 0   sertraline  (ZOLOFT ) 100 MG tablet, Take 1 tablet (100 mg total) by mouth once daily., Disp: 30 tablet, Rfl: 0   sertraline  (ZOLOFT ) 50 MG tablet, Take 1 tablet (50 mg total) by mouth daily., Disp: 30 tablet, Rfl: 0   sertraline  (ZOLOFT ) 50 MG tablet, Take 1 tablet (50 mg total) by mouth daily., Disp: 30 tablet, Rfl: 0   Medications ordered in this encounter:  No orders of the defined types were placed in this encounter.    *If you need refills on other medications prior to your next appointment, please contact your pharmacy*  Follow-Up: Call back or seek an in-person evaluation if the symptoms worsen or if the condition fails to improve as anticipated.  [facility] [phone]  Other Instructions Take tessalon  for cough. You can take over the counter Imodium  for diarrhea. Stay well hydrated and rotate tylenol  and motrin  for fevers and  body aches   If you were given a prescription, please take the prescription as you were instructed and follow the directions given on the discharge paperwork.   Over the next several days you should rest as much as possible, and drink more fluids than usual. Liquids will help thin and loosen mucus so you can cough it up. Liquids will also help prevent dehydration. Using a cool mist humidifier or a vaporizer to increase air moisture in your home can also make it easier for you to breathe and help decrease your cough.  To help soothe a sore throat gargle with warm salt water.  Make salt water by dissolving  teaspoon salt in 1 cup warm water. You may also use throat lozenges and over the counter sore throat spray.  Follow up with your regular doctor in 1 week for reassessment and seek care sooner if your symptoms worsen or fail to improve.    If you have been instructed to have an in-person evaluation today at a local Urgent Care facility, please use the link below. It will take you to a list of all of our available [facility], including address, phone number and hours of operation. Please do not delay care.  [facility]  If you or a family member do not have a primary care provider, use the link below to schedule a visit and establish care. When you choose a [facility] primary care physician or advanced practice provider, you gain a long-term  partner in health. Find a Primary Care Provider  Learn more about [facility]'s in-office and virtual care options: [facility] - Get Care Now
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
## 2024-02-15 ENCOUNTER — Other Ambulatory Visit: Payer: Self-pay

## 2024-02-19 ENCOUNTER — Ambulatory Visit: Payer: Self-pay | Admitting: Licensed Clinical Social Worker

## 2024-02-19 ENCOUNTER — Other Ambulatory Visit: Payer: Self-pay | Admitting: Gerontology

## 2024-02-19 ENCOUNTER — Other Ambulatory Visit: Payer: Self-pay

## 2024-02-19 DIAGNOSIS — G4700 Insomnia, unspecified: Secondary | ICD-10-CM

## 2024-02-19 DIAGNOSIS — F332 Major depressive disorder, recurrent severe without psychotic features: Secondary | ICD-10-CM

## 2024-02-19 DIAGNOSIS — F411 Generalized anxiety disorder: Secondary | ICD-10-CM

## 2024-02-19 DIAGNOSIS — F339 Major depressive disorder, recurrent, unspecified: Secondary | ICD-10-CM

## 2024-02-19 MED FILL — Sertraline HCl Tab 50 MG: ORAL | 30 days supply | Qty: 30 | Fill #0 | Status: AC

## 2024-02-19 MED FILL — Sertraline HCl Tab 100 MG: ORAL | 30 days supply | Qty: 30 | Fill #0 | Status: AC

## 2024-02-19 NOTE — BH Specialist Note (Addendum)
 Integrated Behavioral Health Follow Up Telephone Visit  MRN: [id number] Name: Latoya Benson  Number of Integrated Behavioral Health Clinician visits: No data recorded Session Start time: No data recorded  Session End time: No data recorded Total time in minutes: No data recorded   Types of Service: General Behavioral Integrated Care (BHI)  Interpretor:Yes.   Interpretor Name and Language: N/A  Subjective: Latoya Benson is a 28 y.o. female accompanied by herself. Patient was referred by Almarie Simmonds, NP for mental health . Patient reports the following symptoms/concerns: Patient reported adherence to sertraline  as prescribed, though noted some discrepancy in refill timing; stating she refilled the prescription between late July and early August.  Patient stated she had a surplus medication and has taken it consistently, although did not confirm where the surplus medication came from.  Patient endorsed daily anxiety and feeling on edge.  Patient reported decreased appetite but no change in sleep. Patient denied any suicidal or homicidal thoughts.  Duration of problem: years; Severity of problem: moderate  Objective: Mood: Depressed and Affect: Appropriate Risk of harm to self or others: No plan to harm self or others  Life Context: Family and Social: Patient reported distancing herself from family as a coping strategy; finds reduced interaction helpful in decreasing stress. School/Work: No school or work related concerns reported. Self-Care: Client taking medication consistently. Life Changes: No recent major life changes reported.  Patient and/or Family's Strengths/Protective Factors: Concrete supports in place (healthy food, safe environments, etc.), Sense of purpose, and Physical Health (exercise, healthy diet, medication compliance, etc.)  Goals Addressed: Patient will:  Reduce symptoms of: anxiety, depression, and insomnia   Increase knowledge and/or ability of:  coping skills, healthy habits, and stress reduction   Demonstrate ability to: Increase healthy adjustment to current life circumstances, Increase adequate support systems for patient/family, and Begin healthy grieving over loss  Progress towards Goals: Ongoing  Interventions: Interventions utilized:  Supportive Counseling Standardized Assessments completed: GAD-7 and PHQ 9 Gad-7= 19 Phq-9=15   Patient and/or Family Response: Patient engaged openly during telephone session.  She was cooperative, demonstrated insight into her coping strategies, and expressed willingness to continue treatment plan.  Patient Centered Plan: Patient identified goals of reducing anxiety, improving mood, and maintaining consistent psychotropic medication use as prescribed.  She will continue to monitor mood, appetite changes, and sleep. In consultation with Dr. Lavine, MD (psychiatric consultant), and Almarie Simmonds, NP (primary care provider), on 02/12/2024 at 9 am it was determined that Abilify 25 mg may be a consideration in addition to her current medication regimen if the patient continued to experience increasing symptoms. Patient agreed to this plan.  Clinical Assessment/Diagnosis  No diagnosis found.    Assessment: Patient currently experiencing daily anxiety, persistent depressed mood, and feeling of being on edge.  Patient reports decreased appetite but no changes in sleep.  Continues to distance from family as a coping strategy, which she perceives as helpful.  Patient may benefit from continued pharmacological management with the addition of Abilify as discussed in consultation, consistent use of coping skills, and engagement in behavioral activation strategies to support mood regulation and appetite.  Ongoing monitoring of symptoms, medication adherence, and side effects is recommended.  Plan: Follow up with behavioral health clinician on : 03/11/2024 @ Noon Behavioral recommendations: Patient is  encouraged to maintain daily structure and practice coping skills between visits. Referral(s): Integrated [facility] (In Clinic)  [place]ell JINNY Griffin, [place]

## 2024-02-20 ENCOUNTER — Other Ambulatory Visit: Payer: Self-pay

## 2024-02-24 ENCOUNTER — Other Ambulatory Visit: Payer: Self-pay

## 2024-03-10 ENCOUNTER — Encounter: Payer: Self-pay | Admitting: Obstetrics & Gynecology

## 2024-03-10 ENCOUNTER — Encounter: Payer: Self-pay | Admitting: Certified Nurse Midwife

## 2024-03-11 ENCOUNTER — Ambulatory Visit: Payer: Self-pay | Admitting: Licensed Clinical Social Worker

## 2024-03-12 ENCOUNTER — Encounter: Payer: Self-pay | Admitting: Certified Nurse Midwife

## 2024-03-13 ENCOUNTER — Other Ambulatory Visit: Payer: Self-pay | Admitting: Gerontology

## 2024-03-13 ENCOUNTER — Other Ambulatory Visit: Payer: Self-pay

## 2024-03-13 DIAGNOSIS — R200 Anesthesia of skin: Secondary | ICD-10-CM

## 2024-03-13 DIAGNOSIS — G4700 Insomnia, unspecified: Secondary | ICD-10-CM

## 2024-03-13 MED ORDER — GABAPENTIN 300 MG PO CAPS
300.0000 mg | ORAL_CAPSULE | Freq: Every day | ORAL | 0 refills | Status: DC
  Filled 2024-03-13 – 2024-03-18 (×2): qty 30, 30d supply, fill #0

## 2024-03-18 ENCOUNTER — Other Ambulatory Visit: Payer: Self-pay

## 2024-03-25 ENCOUNTER — Telehealth: Payer: Self-pay | Admitting: Licensed Clinical Social Worker

## 2024-03-25 ENCOUNTER — Ambulatory Visit: Payer: Self-pay | Admitting: Licensed Clinical Social Worker

## 2024-03-25 NOTE — Telephone Encounter (Signed)
 Called Pt twice during scheduled appt time; no answer; left messgae and instruction s for rescheduling.

## 2024-03-26 ENCOUNTER — Other Ambulatory Visit: Payer: Self-pay | Admitting: Gerontology

## 2024-03-26 ENCOUNTER — Other Ambulatory Visit: Payer: Self-pay

## 2024-03-26 ENCOUNTER — Emergency Department
Admission: EM | Admit: 2024-03-26 | Discharge: 2024-03-26 | Disposition: A | Discharge status: Home / Self Care | Payer: Self-pay | Attending: Emergency Medicine | Admitting: Emergency Medicine

## 2024-03-26 ENCOUNTER — Emergency Department: Payer: Self-pay

## 2024-03-26 DIAGNOSIS — R1031 Right lower quadrant pain: Secondary | ICD-10-CM | POA: Insufficient documentation

## 2024-03-26 DIAGNOSIS — E119 Type 2 diabetes mellitus without complications: Secondary | ICD-10-CM | POA: Insufficient documentation

## 2024-03-26 DIAGNOSIS — F332 Major depressive disorder, recurrent severe without psychotic features: Secondary | ICD-10-CM

## 2024-03-26 LAB — URINALYSIS, ROUTINE W REFLEX MICROSCOPIC
Bilirubin Urine: NEGATIVE
Glucose, UA: NEGATIVE mg/dL
Hgb urine dipstick: NEGATIVE
Ketones, ur: NEGATIVE mg/dL
Leukocytes,Ua: NEGATIVE
Nitrite: NEGATIVE
Protein, ur: NEGATIVE mg/dL
Specific Gravity, Urine: 1.012 (ref 1.005–1.030)
pH: 5 (ref 5.0–8.0)

## 2024-03-26 LAB — CBC
HCT: 39.8 % (ref 36.0–46.0)
Hemoglobin: 13.1 g/dL (ref 12.0–15.0)
MCH: 31.2 pg (ref 26.0–34.0)
MCHC: 32.9 g/dL (ref 30.0–36.0)
MCV: 94.8 fL (ref 80.0–100.0)
Platelets: 441 K/uL — ABNORMAL HIGH (ref 150–400)
RBC: 4.2 MIL/uL (ref 3.87–5.11)
RDW: 13.5 % (ref 11.5–15.5)
WBC: 7.6 K/uL (ref 4.0–10.5)
nRBC: 0 % (ref 0.0–0.2)

## 2024-03-26 LAB — COMPREHENSIVE METABOLIC PANEL WITH GFR
ALT: 18 U/L (ref 0–44)
AST: 21 U/L (ref 15–41)
Albumin: 4 g/dL (ref 3.5–5.0)
Alkaline Phosphatase: 55 U/L (ref 38–126)
Anion gap: 13 (ref 5–15)
BUN: 11 mg/dL (ref 6–20)
CO2: 25 mmol/L (ref 22–32)
Calcium: 9.6 mg/dL (ref 8.9–10.3)
Chloride: 102 mmol/L (ref 98–111)
Creatinine, Ser: 0.79 mg/dL (ref 0.44–1.00)
GFR, Estimated: >60 mL/min (ref >60)
Glucose, Bld: 112 mg/dL — ABNORMAL HIGH (ref 70–99)
Potassium: 3.9 mmol/L (ref 3.5–5.1)
Sodium: 140 mmol/L (ref 135–145)
Total Bilirubin: 0.4 mg/dL (ref 0.0–1.2)
Total Protein: 7.7 g/dL (ref 6.5–8.1)

## 2024-03-26 LAB — LIPASE, BLOOD: Lipase: 33 U/L (ref 11–51)

## 2024-03-26 LAB — POC URINE PREG, ED: Preg Test, Ur: NEGATIVE

## 2024-03-26 MED ORDER — IOHEXOL 300 MG/ML  SOLN
100.0000 mL | Freq: Once | INTRAMUSCULAR | Status: AC | PRN
  Administered 2024-03-26: 100 mL via INTRAVENOUS

## 2024-03-26 NOTE — ED Provider Notes (Signed)
 [facility] Provider Note    Event Date/Time   First MD Initiated Contact with Patient 03/26/24 0827     (approximate)   History   Abdominal Pain   HPI  Latoya Benson is a 28 y.o. female history of PCOS, diabetes, urosepsis presents emergency department with complaints of right lower quadrant pain.  States feels hot all the time but unsure if it was a fever.  No vomiting but did have some diarrhea.  States unsure if this is something else going on in her abdomen or if it is her PCOS.  Symptoms have been ongoing for 3 days      Physical Exam   Triage Vital Signs: ED Triage Vitals  Encounter Vitals Group     BP 03/26/24 0820 (!) 140/113     Girls Systolic BP Percentile --      Girls Diastolic BP Percentile --      Boys Systolic BP Percentile --      Boys Diastolic BP Percentile --      Pulse Rate 03/26/24 0820 90     Resp 03/26/24 0820 16     Temp 03/26/24 0820 98.2 F (36.8 C)     Temp Source 03/26/24 0820 Oral     SpO2 03/26/24 0820 100 %     Weight 03/26/24 0819 210 lb (95.3 kg)     Height 03/26/24 0819 5' 2 (1.575 m)     Head Circumference --      Peak Flow --      Pain Score 03/26/24 0818 9     Pain Loc --      Pain Education --      Exclude from Growth Chart --     Most recent vital signs: Vitals:   03/26/24 0820  BP: (!) 140/113  Pulse: 90  Resp: 16  Temp: 98.2 F (36.8 C)  SpO2: 100%     General: Awake, no distress.   CV:  Good peripheral perfusion.  Resp:  Normal effort.  Abd:  No distention.  Tender in the right lower quadrant Other:     ED Results / Procedures / Treatments   Labs (all labs ordered are listed, but only abnormal results are displayed) Labs Reviewed  COMPREHENSIVE METABOLIC PANEL WITH GFR - Abnormal; Notable for the following components:      Result Value   Glucose, Bld 112 (*)    All other components within normal limits  CBC - Abnormal; Notable for the following components:   Platelets  441 (*)    All other components within normal limits  URINALYSIS, ROUTINE W REFLEX MICROSCOPIC - Abnormal; Notable for the following components:   Color, Urine YELLOW (*)    APPearance HAZY (*)    All other components within normal limits  LIPASE, BLOOD  POC URINE PREG, ED     EKG     RADIOLOGY CT abdomen pelvis IV contrast    PROCEDURES:   Procedures  Critical Care:  no Chief Complaint  Patient presents with   Abdominal Pain      MEDICATIONS ORDERED IN ED: Medications  iohexol  (OMNIPAQUE ) 300 MG/ML solution 100 mL (100 mLs Intravenous Contrast Given 03/26/24 0905)     IMPRESSION / MDM / ASSESSMENT AND PLAN / ED COURSE  I reviewed the triage vital signs and the nursing notes.  Differential diagnosis includes, but is not limited to, acute appendicitis, pyelonephritis, UTI, sepsis, ovarian cyst, ectopic pregnancy  Patient's presentation is most consistent with acute illness / injury with system symptoms.   Labs, imaging ordered  Labs reassuring  CT abdomen pelvis, independent review and interpretation by me as being negative for any acute abnormality.  I did explain findings to the patient.  If she is worsening she had strict instructions to return to emergency department.  Explained to her she have a normal exam today but if it is to beginning of an appendicitis etc. we may not see things on CT and would need to repeat her imaging.  She is to take Tylenol  and ibuprofen  for pain as needed.  Drink plenty of water.  She is given a work note for today and tomorrow.  Discharged stable condition.  Do not feel patient requires admission or observation at this time.     FINAL CLINICAL IMPRESSION(S) / ED DIAGNOSES   Final diagnoses:  Right lower quadrant abdominal pain     Rx / DC Orders   ED Discharge Orders     None        Note:  This document was prepared using Dragon voice recognition software and may include  unintentional dictation errors.    Gasper Devere ORN, PA-C 03/26/24 1035    Arlander Charleston, MD 03/26/24 [phone]

## 2024-03-26 NOTE — Discharge Instructions (Signed)
Follow-up with your regular doctor if not improving to 3 days.  Return if worsening.  Take Tylenol and ibuprofen for pain as needed.

## 2024-03-26 NOTE — ED Triage Notes (Signed)
 Pt to ED for intermittent abdominal pain since 3 days, worse this AM. Pain is lower and bilateral. Also diarrhea since yesterday (4 times).  States hx PCOS. Pt in NAD. Rates pain 9/10.

## 2024-03-27 ENCOUNTER — Other Ambulatory Visit: Payer: Self-pay

## 2024-03-27 ENCOUNTER — Telehealth: Payer: Self-pay | Admitting: Physician Assistant

## 2024-03-27 ENCOUNTER — Other Ambulatory Visit: Payer: Self-pay | Admitting: Gerontology

## 2024-03-27 DIAGNOSIS — R509 Fever, unspecified: Secondary | ICD-10-CM

## 2024-03-27 DIAGNOSIS — F332 Major depressive disorder, recurrent severe without psychotic features: Secondary | ICD-10-CM

## 2024-03-27 DIAGNOSIS — J029 Acute pharyngitis, unspecified: Secondary | ICD-10-CM

## 2024-03-27 DIAGNOSIS — R110 Nausea: Secondary | ICD-10-CM

## 2024-03-27 MED ORDER — ONDANSETRON 4 MG PO TBDP: 4.0000 mg | ORAL_TABLET | Freq: Three times a day (TID) | ORAL | 0 refills | Status: AC | PRN

## 2024-03-27 MED ORDER — AMOXICILLIN 500 MG PO CAPS: 500.0000 mg | ORAL_CAPSULE | Freq: Two times a day (BID) | ORAL | 0 refills | Status: AC

## 2024-03-27 MED FILL — Sertraline HCl Tab 50 MG: ORAL | 30 days supply | Qty: 30 | Fill #0 | Status: AC

## 2024-03-27 MED FILL — Sertraline HCl Tab 100 MG: ORAL | 30 days supply | Qty: 30 | Fill #0 | Status: AC

## 2024-03-27 NOTE — Progress Notes (Signed)
 Virtual Visit Consent   Latoya Benson, you are scheduled for a virtual visit with a [facility] provider today. Just as with appointments in the office, your consent must be obtained to participate. Your consent will be active for this visit and any virtual visit you may have with one of our providers in the next 365 days. If you have a MyChart account, a copy of this consent can be sent to you electronically.  As this is a virtual visit, video technology does not allow for your provider to perform a traditional examination. This may limit your provider's ability to fully assess your condition. If your provider identifies any concerns that need to be evaluated in person or the need to arrange testing (such as labs, EKG, etc.), we will make arrangements to do so. Although advances in technology are sophisticated, we cannot ensure that it will always work on either your end or our end. If the connection with a video visit is poor, the visit may have to be switched to a telephone visit. With either a video or telephone visit, we are not always able to ensure that we have a secure connection.  By engaging in this virtual visit, you consent to the provision of healthcare and authorize for your insurance to be billed (if applicable) for the services provided during this visit. Depending on your insurance coverage, you may receive a charge related to this service.  I need to obtain your verbal consent now. Are you willing to proceed with your visit today? Latoya Benson has provided verbal consent on 03/27/2024 for a virtual visit (video or telephone). Lovette Borg, PA-C  Date: 03/27/2024 7:44 PM   Virtual Visit via Video Note   I, [name], connected with  Latoya Benson  ([id number], 05-27-1996) on 03/27/24 at  7:15 PM EDT by a video-enabled telemedicine application and verified that I am speaking with the correct person using two identifiers.  Location: Patient: Virtual Visit Location  Patient: Home Provider: Virtual Visit Location Provider: Home Office   I discussed the limitations of evaluation and management by telemedicine and the availability of in person appointments. The patient expressed understanding and agreed to proceed.    History of Present Illness: Latoya Benson is a 28 y.o. who identifies as a female who was assigned female at birth, and is being seen today for sore throat and nausea.  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 No fever yesterday, but had R lower quadrant abdominal pain. CT negative in the ER. Other tests wnl including urine pregnancy, pancreas, u/a.     Problems:  Patient Active Problem List   Diagnosis Date Noted   Irregular periods/menstrual cycles 01/03/2024   High serum vitamin B12 05/16/2023   Numbness 05/02/2023   Weight gain 05/02/2023   Nausea & vomiting 09/14/2022   Headache 04/19/2022   Follow-up exam 08/04/2021   Elevated alanine aminotransferase (ALT) level 11/24/2020   Proteinuria 11/24/2020   Diarrhea 11/24/2020   Vitamin D  deficiency 07/29/2020   Low vitamin B12 level 07/29/2020   Thrombocytosis 07/29/2020   Numbness and tingling in both hands 07/07/2020   Insomnia 07/07/2020   History of PCOS 05/05/2020   Prediabetes 05/05/2020   Myopia of both eyes 05/05/2020   History of depression 05/05/2020   ASCUS of cervix with negative high risk  HPV 08/15/2019   Class 2 obesity with body mass index (BMI) of 38.0 to 38.9 in adult 08/07/2019   Pyelonephritis 07/31/2017   Sepsis (HCC) 04/02/2016   Need for varicella vaccine 07/27/2014   Obesity in pregnancy, antepartum 07/22/2014    Allergies: No Known Allergies Medications:  Current Outpatient Medications:    amoxicillin (AMOXIL) 500 MG capsule, Take 1 capsule (500 mg total) by mouth 2 (two) times daily for 10  days., Disp: 20 capsule, Rfl: 0   ondansetron  (ZOFRAN -ODT) 4 MG disintegrating tablet, Take 1 tablet (4 mg total) by mouth every 8 (eight) hours as needed for nausea or vomiting., Disp: 20 tablet, Rfl: 0   allopurinol  (ZYLOPRIM ) 100 MG tablet, Take 1 tablet (100 mg total) by mouth 2 (two) times daily., Disp: 60 tablet, Rfl: 5   gabapentin  (NEURONTIN ) 300 MG capsule, Take 1 capsule (300 mg total) by mouth at bedtime., Disp: 30 capsule, Rfl: 0   sertraline  (ZOLOFT ) 100 MG tablet, Take 1 tablet (100 mg total) by mouth daily., Disp: 30 tablet, Rfl: 0   sertraline  (ZOLOFT ) 50 MG tablet, Take 1 tablet (50 mg total) by mouth daily., Disp: 30 tablet, Rfl: 0  Observations/Objective: Patient is well-developed, well-nourished in no acute distress.  Resting comfortably  at home.  Head is normocephalic, atraumatic.  No labored breathing.  Speech is clear and coherent with logical content.  Patient is alert and oriented at baseline.    Assessment and Plan: 1. Sore throat (Primary) - amoxicillin (AMOXIL) 500 MG capsule; Take 1 capsule (500 mg total) by mouth 2 (two) times daily for 10 days.  Dispense: 20 capsule; Refill: 0  2. Nausea - ondansetron  (ZOFRAN -ODT) 4 MG disintegrating tablet; Take 1 tablet (4 mg total) by mouth every 8 (eight) hours as needed for nausea or vomiting.  Dispense: 20 tablet; Refill: 0  3. Fever, unspecified fever cause  I reviewed test results from the ER with patient. She verbalized understanding. Follow up with PCP for incidental findings of the CT test result.  Stay well hydrated. Take otc Tylenol  alternating with Ibuprofen  for fever. Start antibiotic as prescribed.  Start anti-nausea medicine as prescribed.  Highly advised and recommended to go to UC to r/o flu, covid, strep. If worsening symptoms then go to ER for further evaluation. Pt verbalized understanding and in agreement.     Follow Up Instructions: I discussed the assessment and treatment plan with the  patient. The patient was provided an opportunity to ask questions and all were answered. The patient agreed with the plan and demonstrated an understanding of the instructions.  A copy of instructions were sent to the patient via MyChart unless otherwise noted below.   Patient has requested to receive PHI (AVS, Work Notes, etc) pertaining to this video visit through e-mail as they are currently without active MyChart. They have voiced understand that email is not considered secure and their health information could be viewed by someone other than the patient.   The patient was advised to call back or seek an in-person evaluation if the symptoms worsen or if the condition fails to improve as anticipated.    Devyn Sheerin, PA-C

## 2024-03-27 NOTE — Patient Instructions (Signed)
 Corean LITTIE Agent, thank you for joining Lovette Borg, PA-C for today's virtual visit.  While this provider is not your primary care provider (PCP), if your PCP is located in our provider database this encounter information will be shared with them immediately following your visit.   A [facility] MyChart account gives you access to today's visit and all your visits, tests, and labs performed at [facility]  click here if you don't have a [facility] MyChart account or go to [email or web address]  Consent: (Patient) Corean LITTIE Agent provided verbal consent for this virtual visit at the beginning of the encounter.  Current Medications:  Current Outpatient Medications:    amoxicillin (AMOXIL) 500 MG capsule, Take 1 capsule (500 mg total) by mouth 2 (two) times daily for 10 days., Disp: 20 capsule, Rfl: 0   ondansetron  (ZOFRAN -ODT) 4 MG disintegrating tablet, Take 1 tablet (4 mg total) by mouth every 8 (eight) hours as needed for nausea or vomiting., Disp: 20 tablet, Rfl: 0   allopurinol  (ZYLOPRIM ) 100 MG tablet, Take 1 tablet (100 mg total) by mouth 2 (two) times daily., Disp: 60 tablet, Rfl: 5   gabapentin  (NEURONTIN ) 300 MG capsule, Take 1 capsule (300 mg total) by mouth at bedtime., Disp: 30 capsule, Rfl: 0   sertraline  (ZOLOFT ) 100 MG tablet, Take 1 tablet (100 mg total) by mouth daily., Disp: 30 tablet, Rfl: 0   sertraline  (ZOLOFT ) 50 MG tablet, Take 1 tablet (50 mg total) by mouth daily., Disp: 30 tablet, Rfl: 0   Medications ordered in this encounter:  Meds ordered this encounter  Medications   amoxicillin (AMOXIL) 500 MG capsule    Sig: Take 1 capsule (500 mg total) by mouth 2 (two) times daily for 10 days.    Dispense:  20 capsule    Refill:  0    Supervising Provider:   LAMPTEY, PHILIP O [[id number]]   ondansetron  (ZOFRAN -ODT) 4 MG disintegrating tablet    Sig: Take 1 tablet (4 mg total) by mouth every 8 (eight) hours as needed for nausea or vomiting.    Dispense:   20 tablet    Refill:  0    Supervising Provider:   LAMPTEY, PHILIP O [[id number]]     *If you need refills on other medications prior to your next appointment, please contact your pharmacy*  Follow-Up: Call back or seek an in-person evaluation if the symptoms worsen or if the condition fails to improve as anticipated.  [facility] [phone]  Other Instructions I reviewed test results from the ER with patient. She verbalized understanding. Follow up with PCP for incidental findings of the CT test result.  Stay well hydrated. Take otc Tylenol  alternating with Ibuprofen  for fever. Start antibiotic as prescribed.  Start anti-nausea medicine as prescribed.  Highly advised and recommended to go to UC to r/o flu, covid, strep. If worsening symptoms then go to ER for further evaluation.   If you have been instructed to have an in-person evaluation today at a local Urgent Care facility, please use the link below. It will take you to a list of all of our available [facility], including address, phone number and hours of operation. Please do not delay care.  [facility]  If you or a family member do not have a primary care provider, use the link below to schedule a visit and establish care. When you choose a [facility] primary care physician or advanced practice provider, you gain a long-term  partner in health. Find a Primary Care Provider  Learn more about [facility] in-office and virtual care options: [employer] - Get Care Now

## 2024-04-14 ENCOUNTER — Other Ambulatory Visit: Payer: Self-pay | Admitting: Gerontology

## 2024-04-14 DIAGNOSIS — R200 Anesthesia of skin: Secondary | ICD-10-CM

## 2024-04-14 DIAGNOSIS — G4700 Insomnia, unspecified: Secondary | ICD-10-CM

## 2024-04-16 ENCOUNTER — Other Ambulatory Visit: Payer: Self-pay

## 2024-04-16 ENCOUNTER — Other Ambulatory Visit: Payer: Self-pay | Admitting: Gerontology

## 2024-04-16 DIAGNOSIS — R202 Paresthesia of skin: Secondary | ICD-10-CM

## 2024-04-16 DIAGNOSIS — G4700 Insomnia, unspecified: Secondary | ICD-10-CM

## 2024-04-17 ENCOUNTER — Other Ambulatory Visit: Payer: Self-pay

## 2024-04-17 ENCOUNTER — Other Ambulatory Visit: Payer: Self-pay | Admitting: Gerontology

## 2024-04-17 DIAGNOSIS — R200 Anesthesia of skin: Secondary | ICD-10-CM

## 2024-04-17 DIAGNOSIS — G4700 Insomnia, unspecified: Secondary | ICD-10-CM

## 2024-04-18 ENCOUNTER — Other Ambulatory Visit: Payer: Self-pay

## 2024-04-21 ENCOUNTER — Other Ambulatory Visit: Payer: Self-pay

## 2024-04-22 ENCOUNTER — Ambulatory Visit: Payer: Self-pay | Admitting: Licensed Clinical Social Worker

## 2024-04-22 ENCOUNTER — Other Ambulatory Visit: Payer: Self-pay

## 2024-04-22 DIAGNOSIS — F411 Generalized anxiety disorder: Secondary | ICD-10-CM

## 2024-04-22 DIAGNOSIS — G4700 Insomnia, unspecified: Secondary | ICD-10-CM

## 2024-04-22 DIAGNOSIS — F339 Major depressive disorder, recurrent, unspecified: Secondary | ICD-10-CM

## 2024-04-22 DIAGNOSIS — R202 Paresthesia of skin: Secondary | ICD-10-CM

## 2024-04-22 MED ORDER — GABAPENTIN 300 MG PO CAPS
300.0000 mg | ORAL_CAPSULE | Freq: Every day | ORAL | 0 refills | Status: DC
  Filled 2024-04-22: qty 30, 30d supply, fill #0

## 2024-04-22 NOTE — BH Specialist Note (Deleted)
 Integrated Behavioral Health Follow Up In-Person Visit  MRN: [id number] Name: FINNLEY LEWIS  Number of Integrated Behavioral Health Clinician visits: No data recorded Session Start time: No data recorded  Session End time: No data recorded Total time in minutes: No data recorded   Types of Service: {CHL AMB TYPE OF [phone]}  Interpretor:{yes wn:[id number]} Interpretor Name and Language: ***  Subjective: CLETIS MUMA is a 28 y.o. female accompanied by {Patient accompanied [phone]} Patient was referred by *** for ***. Patient reports the following symptoms/concerns: *** Duration of problem: ***; Severity of problem: {Mild/Moderate/Severe:20260}  Objective: Mood: {BHH MOOD:22306} and Affect: {BHH AFFECT:22307} Risk of harm to self or others: {CHL AMB BH Suicide Current Mental Status:21022748}  Life Context: Family and Social: *** School/Work: *** Self-Care: *** Life Changes: ***  Patient and/or Family's Strengths/Protective Factors: {CHL AMB BH PROTECTIVE FACTORS:[phone]}  Goals Addressed: Patient will:  Reduce symptoms of: {IBH Symptoms:21014056}   Increase knowledge and/or ability of: {IBH Patient Tools:21014057}   Demonstrate ability to: {IBH Goals:21014053}  Progress towards Goals: {CHL AMB BH PROGRESS TOWARDS GOALS:[phone]}  Interventions: Interventions utilized:  {IBH Interventions:21014054} Standardized Assessments completed: {IBH Screening Tools:21014051}      Patient and/or Family Response: ***  Patient Centered Plan: Patient is on the following Treatment Plan(s): ***  Clinical Assessment/Diagnosis  No diagnosis found.    Assessment: Patient currently experiencing ***.   Patient may benefit from ***.  Plan: Follow up with behavioral health clinician on : *** Behavioral recommendations: *** Referral(s): {IBH Referrals:21014055}  Powell JINNY Griffin, LCSWA

## 2024-04-22 NOTE — Addendum Note (Signed)
 Addended by: Ryatt Corsino E on: 04/22/2024 05:08 PM   Modules accepted: Orders

## 2024-04-22 NOTE — BH Specialist Note (Signed)
 Integrated Behavioral Health via Telemedicine Visit  04/22/2024 Latoya Benson [id number]  Number of Integrated Behavioral Health Clinician visits: No data recorded Session Start time: No data recorded  Session End time: No data recorded Total time in minutes: No data recorded   Referring Provider: Almarie Simmonds, NP  Patient/Family location: Patient located at their home address which is on file.  [facility] Provider location: Remote   All persons participating in visit: Carleen Rhue and [employer], LCSW. Types of Service: Telephone visit  I connected with Corean LITTIE Agent via  Telephone or Video Enabled Telemedicine Application  (Video is Caregility application) and verified that I am speaking with the correct person using two identifiers. Discussed confidentiality: Yes   I discussed the limitations of telemedicine and the availability of in person appointments.  Discussed there is a possibility of technology failure and discussed alternative modes of communication if that failure occurs.  I discussed that engaging in this telemedicine visit, they consent to the provision of behavioral healthcare and the services will be billed under their insurance.  Patient and/or legal guardian expressed understanding and consented to Telemedicine visit: Yes   Presenting Concerns: Patient and/or family reports the following symptoms/concerns: The patient reported she has been doing well overall since her last follow up session. She shared that she had an emergency room visit in mid-October and has been out of work since then. She described noticeable reduction in work related stress and attributed this to a decrease in her anxiety and depressive symptoms. She also noted increased financial stress due to being out of work.  The patient discussed the upcoming holiday season as her first without her father, who passed away earlier this year. She described ongoing grief process and anticipatory  emotional distress to the holidays. She stated she has been taking her gabapentin  300 MG nightly, which has helped her fall asleep within one to two hours with no nighttime awakenings. She reported improved sleep quality but stated she has been out of her gabapentin  since last Thursday and requested the clinician contact her prescriber regarding a refill   Patient and/or Family's Strengths/Protective Factors: Social connections, Concrete supports in place (healthy food, safe environments, etc.), Sense of purpose, and Physical Health (exercise, healthy diet, medication compliance, etc.)  Goals Addressed: Patient will:  Reduce symptoms of: agitation, anxiety, insomnia, and stress   Increase knowledge and/or ability of: healthy habits and self-management skills   Demonstrate ability to: Increase healthy adjustment to current life circumstances  Progress towards Goals: Ongoing    Interventions: Interventions utilized:  Supportive Counseling Standardized Assessments completed: GAD-7 and PHQ 2&9 with C-SSRS GAD-7= 2 PHQ-2= 1 C-SSRS = No Risk Identified   Patient and/or Family Response: Patient was agreeable with updating frequency of telephone sessions to once monthly and the plan to increase as needed.   Clinical Assessment/Diagnosis  No diagnosis found.    Assessment: Patient currently experiencing situational anxiety, mild depressive symptoms, ongoing grief reaction related to the loss of her father, and increased financial stress secondary to being out of work. Despite these stressors, she reports symptom improvement associated with reduced occupational demands. Sleep disturbance has improved. Patient presents as cooperative, oriented x4, and able to articulate her symptoms clearly. No safety concerns were identified.   Patient may benefit from: Continued supportive psychotherapy to address grief triggers, emotional regulation, and stress related to finances and ongoing monitoring of  anxiety and depressive symptoms.   Plan: Follow up with behavioral health clinician on : 05/20/2024 at 9:00  AM  Behavioral recommendations: Patient is recommended to practice daily self-care and use coping skills, and maintain medication adherence.  Referral(s): Integrated [employer] (In Clinic)  I discussed the assessment and treatment plan with the patient and/or parent/guardian. They were provided an opportunity to ask questions and all were answered. They agreed with the plan and demonstrated an understanding of the instructions.   They were advised to call back or seek an in-person evaluation if the symptoms worsen or if the condition fails to improve as anticipated.  Powell JINNY Griffin, LCSWA

## 2024-04-23 ENCOUNTER — Other Ambulatory Visit: Payer: Self-pay | Admitting: Gerontology

## 2024-04-23 ENCOUNTER — Other Ambulatory Visit: Payer: Self-pay

## 2024-04-23 DIAGNOSIS — F332 Major depressive disorder, recurrent severe without psychotic features: Secondary | ICD-10-CM

## 2024-04-24 ENCOUNTER — Other Ambulatory Visit: Payer: Self-pay

## 2024-04-24 MED ORDER — SERTRALINE HCL 100 MG PO TABS
100.0000 mg | ORAL_TABLET | Freq: Every day | ORAL | 0 refills | Status: DC
  Filled 2024-04-24 – 2024-05-21 (×2): qty 30, 30d supply, fill #0

## 2024-04-24 MED ORDER — SERTRALINE HCL 50 MG PO TABS
50.0000 mg | ORAL_TABLET | Freq: Every day | ORAL | 0 refills | Status: DC
  Filled 2024-04-24 – 2024-05-21 (×2): qty 30, 30d supply, fill #0

## 2024-05-21 ENCOUNTER — Other Ambulatory Visit: Payer: Self-pay | Admitting: Gerontology

## 2024-05-21 DIAGNOSIS — R200 Anesthesia of skin: Secondary | ICD-10-CM

## 2024-05-21 DIAGNOSIS — G4700 Insomnia, unspecified: Secondary | ICD-10-CM

## 2024-05-21 DIAGNOSIS — M1A09X0 Idiopathic chronic gout, multiple sites, without tophus (tophi): Secondary | ICD-10-CM

## 2024-05-22 ENCOUNTER — Other Ambulatory Visit: Payer: Self-pay

## 2024-05-22 MED ORDER — ALLOPURINOL 100 MG PO TABS
100.0000 mg | ORAL_TABLET | Freq: Two times a day (BID) | ORAL | 2 refills | Status: AC
  Filled 2024-05-22: qty 60, 30d supply, fill #0
  Filled 2024-06-24 (×2): qty 60, 30d supply, fill #1

## 2024-05-22 MED ORDER — GABAPENTIN 300 MG PO CAPS
300.0000 mg | ORAL_CAPSULE | Freq: Every day | ORAL | 0 refills | Status: DC
  Filled 2024-05-22: qty 30, 30d supply, fill #0

## 2024-06-21 ENCOUNTER — Other Ambulatory Visit: Payer: Self-pay | Admitting: Gerontology

## 2024-06-21 DIAGNOSIS — F332 Major depressive disorder, recurrent severe without psychotic features: Secondary | ICD-10-CM

## 2024-06-21 DIAGNOSIS — G4700 Insomnia, unspecified: Secondary | ICD-10-CM

## 2024-06-21 DIAGNOSIS — R200 Anesthesia of skin: Secondary | ICD-10-CM

## 2024-06-23 ENCOUNTER — Other Ambulatory Visit: Payer: Self-pay | Admitting: Gerontology

## 2024-06-23 ENCOUNTER — Other Ambulatory Visit: Payer: Self-pay

## 2024-06-23 DIAGNOSIS — R200 Anesthesia of skin: Secondary | ICD-10-CM

## 2024-06-23 DIAGNOSIS — G4700 Insomnia, unspecified: Secondary | ICD-10-CM

## 2024-06-23 DIAGNOSIS — F332 Major depressive disorder, recurrent severe without psychotic features: Secondary | ICD-10-CM

## 2024-06-24 ENCOUNTER — Other Ambulatory Visit: Payer: Self-pay

## 2024-06-26 ENCOUNTER — Other Ambulatory Visit: Payer: Self-pay

## 2024-06-26 DIAGNOSIS — Z0000 Encounter for general adult medical examination without abnormal findings: Secondary | ICD-10-CM

## 2024-06-26 MED FILL — Sertraline HCl Tab 50 MG: ORAL | 30 days supply | Qty: 30 | Fill #0 | Status: AC

## 2024-06-26 MED FILL — Gabapentin Cap 300 MG: ORAL | 30 days supply | Qty: 30 | Fill #0 | Status: AC

## 2024-06-26 MED FILL — Sertraline HCl Tab 100 MG: ORAL | 30 days supply | Qty: 30 | Fill #0 | Status: AC

## 2024-06-27 ENCOUNTER — Other Ambulatory Visit: Payer: Self-pay

## 2024-06-27 LAB — CBC WITH DIFFERENTIAL/PLATELET
Basophils Absolute: 0.1 x10E3/uL (ref 0.0–0.2)
Basos: 1 %
EOS (ABSOLUTE): 1.7 x10E3/uL — ABNORMAL HIGH (ref 0.0–0.4)
Eos: 15 %
Hematocrit: 38.3 % (ref 34.0–46.6)
Hemoglobin: 13.8 g/dL (ref 11.1–15.9)
Immature Grans (Abs): 0 x10E3/uL (ref 0.0–0.1)
Immature Granulocytes: 0 %
Lymphocytes Absolute: 3.4 x10E3/uL — ABNORMAL HIGH (ref 0.7–3.1)
Lymphs: 31 %
MCH: 33.2 pg — ABNORMAL HIGH (ref 26.6–33.0)
MCHC: 36 g/dL — ABNORMAL HIGH (ref 31.5–35.7)
MCV: 92 fL (ref 79–97)
Monocytes Absolute: 0.5 x10E3/uL (ref 0.1–0.9)
Monocytes: 5 %
Neutrophils Absolute: 5.3 x10E3/uL (ref 1.4–7.0)
Neutrophils: 48 %
Platelets: 538 x10E3/uL — ABNORMAL HIGH (ref 150–450)
RBC: 4.16 x10E6/uL (ref 3.77–5.28)
RDW: 12.8 % (ref 11.7–15.4)
WBC: 11 x10E3/uL — ABNORMAL HIGH (ref 3.4–10.8)

## 2024-06-27 LAB — COMPREHENSIVE METABOLIC PANEL WITH GFR
ALT: 38 IU/L — ABNORMAL HIGH (ref 0–32)
AST: 37 IU/L (ref 0–40)
Albumin: 4.6 g/dL (ref 4.0–5.0)
Alkaline Phosphatase: 74 IU/L (ref 41–116)
BUN/Creatinine Ratio: 13 (ref 9–23)
BUN: 9 mg/dL (ref 6–20)
Bilirubin Total: 0.2 mg/dL (ref 0.0–1.2)
CO2: 24 mmol/L (ref 20–29)
Calcium: 10.1 mg/dL (ref 8.7–10.2)
Chloride: 101 mmol/L (ref 96–106)
Creatinine, Ser: 0.69 mg/dL (ref 0.57–1.00)
Globulin, Total: 2.9 g/dL (ref 1.5–4.5)
Glucose: 92 mg/dL (ref 70–99)
Potassium: 3.7 mmol/L (ref 3.5–5.2)
Sodium: 140 mmol/L (ref 134–144)
Total Protein: 7.5 g/dL (ref 6.0–8.5)
eGFR: 120 mL/min/1.73

## 2024-06-27 LAB — LIPID PANEL
Chol/HDL Ratio: 4.8 ratio — ABNORMAL HIGH (ref 0.0–4.4)
Cholesterol, Total: 195 mg/dL (ref 100–199)
HDL: 41 mg/dL
LDL Chol Calc (NIH): 115 mg/dL — ABNORMAL HIGH (ref 0–99)
Triglycerides: 225 mg/dL — ABNORMAL HIGH (ref 0–149)
VLDL Cholesterol Cal: 39 mg/dL (ref 5–40)

## 2024-06-27 LAB — HEMOGLOBIN A1C
Est. average glucose Bld gHb Est-mCnc: 120 mg/dL
Hgb A1c MFr Bld: 5.8 % — ABNORMAL HIGH (ref 4.8–5.6)

## 2024-06-27 LAB — TSH: TSH: 1.73 u[IU]/mL (ref 0.450–4.500)

## 2024-06-27 LAB — PREGNANCY, URINE: Preg Test, Ur: NEGATIVE

## 2024-06-28 ENCOUNTER — Other Ambulatory Visit: Payer: Self-pay

## 2024-07-03 ENCOUNTER — Ambulatory Visit: Payer: Self-pay | Admitting: Gerontology

## 2024-07-10 ENCOUNTER — Emergency Department: Payer: Self-pay

## 2024-07-10 ENCOUNTER — Telehealth: Payer: Self-pay | Admitting: Emergency Medicine

## 2024-07-10 ENCOUNTER — Other Ambulatory Visit: Payer: Self-pay

## 2024-07-10 ENCOUNTER — Emergency Department
Admission: EM | Admit: 2024-07-10 | Discharge: 2024-07-10 | Disposition: A | Discharge status: Home / Self Care | Payer: Self-pay | Attending: Emergency Medicine | Admitting: Emergency Medicine

## 2024-07-10 DIAGNOSIS — R109 Unspecified abdominal pain: Secondary | ICD-10-CM

## 2024-07-10 DIAGNOSIS — R197 Diarrhea, unspecified: Secondary | ICD-10-CM

## 2024-07-10 DIAGNOSIS — K529 Noninfective gastroenteritis and colitis, unspecified: Secondary | ICD-10-CM | POA: Insufficient documentation

## 2024-07-10 LAB — COMPREHENSIVE METABOLIC PANEL WITH GFR
ALT: 39 U/L (ref 0–44)
AST: 31 U/L (ref 15–41)
Albumin: 4.6 g/dL (ref 3.5–5.0)
Alkaline Phosphatase: 66 U/L (ref 38–126)
Anion gap: 12 (ref 5–15)
BUN: 6 mg/dL (ref 6–20)
CO2: 25 mmol/L (ref 22–32)
Calcium: 9.4 mg/dL (ref 8.9–10.3)
Chloride: 102 mmol/L (ref 98–111)
Creatinine, Ser: 0.68 mg/dL (ref 0.44–1.00)
GFR, Estimated: >60 mL/min
Glucose, Bld: 121 mg/dL — ABNORMAL HIGH (ref 70–99)
Potassium: 3.9 mmol/L (ref 3.5–5.1)
Sodium: 139 mmol/L (ref 135–145)
Total Bilirubin: 0.5 mg/dL (ref 0.0–1.2)
Total Protein: 7.5 g/dL (ref 6.5–8.1)

## 2024-07-10 LAB — URINALYSIS, ROUTINE W REFLEX MICROSCOPIC
Bilirubin Urine: NEGATIVE
Glucose, UA: NEGATIVE mg/dL
Hgb urine dipstick: NEGATIVE
Ketones, ur: 20 mg/dL — AB
Leukocytes,Ua: NEGATIVE
Nitrite: NEGATIVE
Protein, ur: NEGATIVE mg/dL
Specific Gravity, Urine: 1.015 (ref 1.005–1.030)
pH: 5 (ref 5.0–8.0)

## 2024-07-10 LAB — CBC
HCT: 41 % (ref 36.0–46.0)
Hemoglobin: 13.3 g/dL (ref 12.0–15.0)
MCH: 30.6 pg (ref 26.0–34.0)
MCHC: 32.4 g/dL (ref 30.0–36.0)
MCV: 94.5 fL (ref 80.0–100.0)
Platelets: 449 10*3/uL — ABNORMAL HIGH (ref 150–400)
RBC: 4.34 MIL/uL (ref 3.87–5.11)
RDW: 13.4 % (ref 11.5–15.5)
WBC: 8.5 10*3/uL (ref 4.0–10.5)
nRBC: 0 % (ref 0.0–0.2)

## 2024-07-10 LAB — POC URINE PREG, ED: Preg Test, Ur: NEGATIVE

## 2024-07-10 LAB — LIPASE, BLOOD: Lipase: 31 U/L (ref 11–51)

## 2024-07-10 MED ORDER — ONDANSETRON HCL 4 MG/2ML IJ SOLN
4.0000 mg | Freq: Once | INTRAMUSCULAR | Status: AC
  Administered 2024-07-10: 4 mg via INTRAVENOUS
  Filled 2024-07-10: qty 2

## 2024-07-10 MED ORDER — IOHEXOL 300 MG/ML  SOLN
100.0000 mL | Freq: Once | INTRAMUSCULAR | Status: AC | PRN
  Administered 2024-07-10: 100 mL via INTRAVENOUS

## 2024-07-10 NOTE — ED Triage Notes (Signed)
 Pt comes with diarrhea for two days and vomiting started last night. Pt states cramping belly pain.

## 2024-07-10 NOTE — Progress Notes (Signed)
 Based on what you shared with me (having abdominal pain and diarrhea), I feel your condition warrants further evaluation as soon as possible at an Emergency department. You will need further evaluation to assess your abdominal pain, including in person exam and likely labs.   NOTE: There will be NO CHARGE for this eVisit   If you are having a true medical emergency please call 911.      Emergency Department-[facility]  Get Driving Directions  [phone]  [address]  [place], [place] [address]  Open 24/7/365      [facility] Emergency Department at [facility]  Get Driving Directions  [address]  [place], [place] [address]  Open 24/7/365    Emergency Department- [facility]  Get Driving Directions  [phone]  [address]  [place], [place] [address]  Open 24/7/365      [facility] at [facility]  Get Driving Directions  [phone]  [address]  [place], [place] [address]  Open 24/7/365    [facility]  Emergency Department- [facility]  Get Driving Directions  [phone]  [address]  [place], [place] [address]  Open 24/7/365    [place]  Emergency Department- [facility]  Get Driving Directions  [address]  [place], [place] [address]  Open 24/7/365    [facility]  Emergency Department- [facility]  Get Driving Directions  [phone]  [address]  [place], [place] [address]  Open 24/7/365

## 2024-07-10 NOTE — Discharge Instructions (Signed)
 Your blood work and CAT scan are reassuring.  Please return for any new, worsening, or changing symptoms or other concerns.  Please stay hydrated and eat bland foods.  It was a pleasure caring for you today.

## 2024-07-10 NOTE — ED Provider Notes (Signed)
 "  [facility] Provider Note    Event Date/Time   First MD Initiated Contact with Patient 07/10/24 1649     (approximate)   History   Diarrhea   HPI  Latoya Benson is a 29 y.o. female who presents today for evaluation of nausea, vomiting, diarrhea for the past 2 days after eating [employer].  She lives with 2 other people who do not have the same symptoms.  She reports that she has had multiple abdominal surgeries including bowel obstructions as an infant, cholecystectomy, and ectopic pregnancy.  She reports that she has central abdominal pain.  Patient Active Problem List   Diagnosis Date Noted   Irregular periods/menstrual cycles 01/03/2024   High serum vitamin B12 05/16/2023   Numbness 05/02/2023   Weight gain 05/02/2023   Nausea & vomiting 09/14/2022   Headache 04/19/2022   Follow-up exam 08/04/2021   Elevated alanine aminotransferase (ALT) level 11/24/2020   Proteinuria 11/24/2020   Diarrhea 11/24/2020   Vitamin D  deficiency 07/29/2020   Low vitamin B12 level 07/29/2020   Thrombocytosis 07/29/2020   Numbness and tingling in both hands 07/07/2020   Insomnia 07/07/2020   History of PCOS 05/05/2020   Prediabetes 05/05/2020   Myopia of both eyes 05/05/2020   History of depression 05/05/2020   ASCUS of cervix with negative high risk HPV 08/15/2019   Class 2 obesity with body mass index (BMI) of 38.0 to 38.9 in adult 08/07/2019   Pyelonephritis 07/31/2017   Sepsis (HCC) 04/02/2016   Need for varicella vaccine 07/27/2014   Obesity in pregnancy, antepartum 07/22/2014          Physical Exam   Triage Vital Signs: ED Triage Vitals [07/10/24 1403]  Encounter Vitals Group     BP (!) 144/105     Girls Systolic BP Percentile      Girls Diastolic BP Percentile      Boys Systolic BP Percentile      Boys Diastolic BP Percentile      Pulse Rate 81     Resp 18     Temp 98.6 F (37 C)     Temp src      SpO2 100 %     Weight 200 lb (90.7 kg)      Height 5' 2 (1.575 m)     Head Circumference      Peak Flow      Pain Score 8     Pain Loc      Pain Education      Exclude from Growth Chart     Most recent vital signs: Vitals:   07/10/24 1403 07/10/24 1930  BP: (!) 144/105 (!) 147/108  Pulse: 81 81  Resp: 18 18  Temp: 98.6 F (37 C)   SpO2: 100% 99%    Physical Exam Vitals and nursing note reviewed.  Constitutional:      General: Awake and alert. No acute distress.    Appearance: Normal appearance. The patient is normal weight.  HENT:     Head: Normocephalic and atraumatic.     Mouth: Mucous membranes are moist.  Eyes:     General: PERRL. Normal EOMs        Right eye: No discharge.        Left eye: No discharge.     Conjunctiva/sclera: Conjunctivae normal.  Cardiovascular:     Rate and Rhythm: Normal rate and regular rhythm.     Pulses: Normal pulses.  Pulmonary:     Effort:  Pulmonary effort is normal. No respiratory distress.     Breath sounds: Normal breath sounds.  Abdominal:     Abdomen is soft.  Several well-healed surgical scars noted.  There is mild supra periumbilical abdominal tenderness. No rebound or guarding. No distention. Musculoskeletal:        General: No swelling. Normal range of motion.     Cervical back: Normal range of motion and neck supple.  Skin:    General: Skin is warm and dry.     Capillary Refill: Capillary refill takes less than 2 seconds.     Findings: No rash.  Neurological:     Mental Status: The patient is awake and alert.      ED Results / Procedures / Treatments   Labs (all labs ordered are listed, but only abnormal results are displayed) Labs Reviewed  COMPREHENSIVE METABOLIC PANEL WITH GFR - Abnormal; Notable for the following components:      Result Value   Glucose, Bld 121 (*)    All other components within normal limits  CBC - Abnormal; Notable for the following components:   Platelets 449 (*)    All other components within normal limits  URINALYSIS,  ROUTINE W REFLEX MICROSCOPIC - Abnormal; Notable for the following components:   Color, Urine AMBER (*)    APPearance CLOUDY (*)    Ketones, ur 20 (*)    All other components within normal limits  LIPASE, BLOOD  POC URINE PREG, ED     EKG     RADIOLOGY I independently reviewed and interpreted imaging and agree with radiologists findings.     PROCEDURES:  Critical Care performed:   Procedures   MEDICATIONS ORDERED IN ED: Medications  ondansetron  (ZOFRAN ) injection 4 mg (4 mg Intravenous Given 07/10/24 1720)  iohexol  (OMNIPAQUE ) 300 MG/ML solution 100 mL (100 mLs Intravenous Contrast Given 07/10/24 1833)     IMPRESSION / MDM / ASSESSMENT AND PLAN / ED COURSE  I reviewed the triage vital signs and the nursing notes.   Differential diagnosis includes, but is not limited to, gastroenteritis, bowel obstruction, ileus, less likely appendicitis or diverticulitis.  Patient is awake and alert, hemodynamically stable and afebrile.  She is nontoxic in appearance.  Abdomen is mildly tender in the periumbilical region, no rebound or guarding.  She appears to be well-hydrated.  She is resting comfortably on the stretcher.  Further workup is indicated.  IV was established and labs were obtained which are overall reassuring.  However, given her multiple abdominal surgeries, CT scan was obtained for evaluation of other intra-abdominal etiology such as bowel obstruction in the setting of her abdominal pain and vomiting.  Fortunately, CT scan was unrevealing with the exception of fluid within her colon consistent with enteritis.  Patient was treated symptomatically with good effect.  She is able to tolerate p.o. and she reports that she feels markedly improved.  We discussed bland diet and hydration, as well as strict return precautions and the importance of close outpatient follow-up.  Patient understands and agrees with plan.  Discharged in stable condition.   Patient's presentation is  most consistent with acute complicated illness / injury requiring diagnostic workup.     FINAL CLINICAL IMPRESSION(S) / ED DIAGNOSES   Final diagnoses:  Gastroenteritis     Rx / DC Orders   ED Discharge Orders     None        Note:  This document was prepared using Dragon voice recognition software and may include unintentional dictation  errors.   Sherron Mapp E, PA-C 07/10/24 1953    Jacolyn Pae, MD 07/11/24 0007  "

## 2024-08-14 ENCOUNTER — Ambulatory Visit: Payer: Self-pay | Admitting: Gerontology
